# Patient Record
Sex: Male | Born: 1945 | Race: Black or African American | Hispanic: No | Marital: Married | Smoking: Former smoker
Health system: Southern US, Community
[De-identification: ages and names within clinical notes are randomized; demographics above are authoritative.]

## PROBLEM LIST (undated history)

## (undated) DIAGNOSIS — N400 Enlarged prostate without lower urinary tract symptoms: Secondary | ICD-10-CM

## (undated) DIAGNOSIS — I10 Essential (primary) hypertension: Secondary | ICD-10-CM

## (undated) DIAGNOSIS — E0590 Thyrotoxicosis, unspecified without thyrotoxic crisis or storm: Secondary | ICD-10-CM

## (undated) DIAGNOSIS — K8020 Calculus of gallbladder without cholecystitis without obstruction: Secondary | ICD-10-CM

## (undated) DIAGNOSIS — K5792 Diverticulitis of intestine, part unspecified, without perforation or abscess without bleeding: Secondary | ICD-10-CM

## (undated) DIAGNOSIS — K5720 Diverticulitis of large intestine with perforation and abscess without bleeding: Secondary | ICD-10-CM

## (undated) HISTORY — PX: MEDIAL PARTIAL KNEE REPLACEMENT: SHX5965

## (undated) HISTORY — DX: Diverticulitis of large intestine with perforation and abscess without bleeding: K57.20

---

## 2004-06-21 ENCOUNTER — Ambulatory Visit: Payer: Self-pay | Admitting: Ophthalmology

## 2006-04-22 ENCOUNTER — Ambulatory Visit: Payer: Self-pay | Admitting: Ophthalmology

## 2011-01-29 ENCOUNTER — Ambulatory Visit: Payer: Self-pay | Admitting: Internal Medicine

## 2011-07-13 ENCOUNTER — Ambulatory Visit: Payer: Self-pay | Admitting: Unknown Physician Specialty

## 2011-08-14 ENCOUNTER — Ambulatory Visit: Payer: Self-pay | Admitting: Unknown Physician Specialty

## 2011-08-20 ENCOUNTER — Ambulatory Visit: Payer: Self-pay | Admitting: Unknown Physician Specialty

## 2012-12-31 ENCOUNTER — Ambulatory Visit: Payer: Self-pay | Admitting: Ophthalmology

## 2012-12-31 LAB — CREATININE, SERUM
EGFR (African American): >60
EGFR (Non-African Amer.): >60

## 2013-10-22 LAB — COMPREHENSIVE METABOLIC PANEL
ALBUMIN: 3.2 g/dL — AB (ref 3.4–5.0)
ANION GAP: 6 — AB (ref 7–16)
Alkaline Phosphatase: 53 U/L
BUN: 17 mg/dL (ref 7–18)
Bilirubin,Total: 0.4 mg/dL (ref 0.2–1.0)
CALCIUM: 9.2 mg/dL (ref 8.5–10.1)
CO2: 23 mmol/L (ref 21–32)
CREATININE: 1.12 mg/dL (ref 0.60–1.30)
Chloride: 110 mmol/L — ABNORMAL HIGH (ref 98–107)
EGFR (Non-African Amer.): >60
Glucose: 150 mg/dL — ABNORMAL HIGH (ref 65–99)
Osmolality: 282 (ref 275–301)
POTASSIUM: 4 mmol/L (ref 3.5–5.1)
SGOT(AST): 32 U/L (ref 15–37)
SGPT (ALT): 36 U/L (ref 12–78)
Sodium: 139 mmol/L (ref 136–145)
Total Protein: 7.3 g/dL (ref 6.4–8.2)

## 2013-10-22 LAB — CBC
HCT: 46 % (ref 40.0–52.0)
HGB: 15 g/dL (ref 13.0–18.0)
MCH: 29.7 pg (ref 26.0–34.0)
MCHC: 32.6 g/dL (ref 32.0–36.0)
MCV: 91 fL (ref 80–100)
Platelet: 200 10*3/uL (ref 150–440)
RBC: 5.05 10*6/uL (ref 4.40–5.90)
RDW: 13.8 % (ref 11.5–14.5)
WBC: 15.1 10*3/uL — AB (ref 3.8–10.6)

## 2013-10-22 LAB — PROTIME-INR
INR: 1
Prothrombin Time: 13.5 secs (ref 11.5–14.7)

## 2013-10-23 ENCOUNTER — Inpatient Hospital Stay: Payer: Self-pay | Admitting: Internal Medicine

## 2013-10-23 LAB — URINALYSIS, COMPLETE
Bacteria: NONE SEEN
Bilirubin,UR: NEGATIVE
Blood: NEGATIVE
GLUCOSE, UR: NEGATIVE mg/dL (ref 0–75)
KETONE: NEGATIVE
Leukocyte Esterase: NEGATIVE
Nitrite: NEGATIVE
PROTEIN: NEGATIVE
Ph: 5 (ref 4.5–8.0)
Specific Gravity: 1.015 (ref 1.003–1.030)
Squamous Epithelial: 1

## 2013-10-23 LAB — HEMOGLOBIN
HGB: 13.4 g/dL (ref 13.0–18.0)
HGB: 13 g/dL (ref 13.0–18.0)
HGB: 12.2 g/dL — ABNORMAL LOW (ref 13.0–18.0)
HGB: 12.7 g/dL — AB (ref 13.0–18.0)
HGB: 12.3 g/dL — ABNORMAL LOW (ref 13.0–18.0)

## 2013-10-23 LAB — CLOSTRIDIUM DIFFICILE(ARMC)

## 2013-10-24 LAB — CBC WITH DIFFERENTIAL/PLATELET
Basophil #: 0.1 10*3/uL (ref 0.0–0.1)
Basophil %: 0.7 %
Eosinophil #: 0 10*3/uL (ref 0.0–0.7)
Eosinophil %: 0.3 %
HCT: 34.4 % — ABNORMAL LOW (ref 40.0–52.0)
HGB: 11.4 g/dL — ABNORMAL LOW (ref 13.0–18.0)
LYMPHS ABS: 1.6 10*3/uL (ref 1.0–3.6)
Lymphocyte %: 11.7 %
MCH: 30 pg (ref 26.0–34.0)
MCHC: 33.2 g/dL (ref 32.0–36.0)
MCV: 90 fL (ref 80–100)
MONOS PCT: 8.2 %
Monocyte #: 1.1 x10 3/mm — ABNORMAL HIGH (ref 0.2–1.0)
NEUTROS ABS: 11.1 10*3/uL — AB (ref 1.4–6.5)
NEUTROS PCT: 79.1 %
PLATELETS: 177 10*3/uL (ref 150–440)
RBC: 3.81 10*6/uL — AB (ref 4.40–5.90)
RDW: 13.7 % (ref 11.5–14.5)
WBC: 14 10*3/uL — AB (ref 3.8–10.6)

## 2013-10-24 LAB — BASIC METABOLIC PANEL
ANION GAP: 8 (ref 7–16)
BUN: 13 mg/dL (ref 7–18)
CALCIUM: 7.6 mg/dL — AB (ref 8.5–10.1)
Chloride: 111 mmol/L — ABNORMAL HIGH (ref 98–107)
Co2: 22 mmol/L (ref 21–32)
Creatinine: 1.18 mg/dL (ref 0.60–1.30)
EGFR (African American): >60
GLUCOSE: 94 mg/dL (ref 65–99)
OSMOLALITY: 281 (ref 275–301)
Potassium: 3.7 mmol/L (ref 3.5–5.1)
Sodium: 141 mmol/L (ref 136–145)

## 2013-10-25 LAB — CBC WITH DIFFERENTIAL/PLATELET
Basophil #: 0.1 10*3/uL (ref 0.0–0.1)
Basophil #: 0.1 10*3/uL (ref 0.0–0.1)
Basophil %: 0.7 %
Basophil %: 0.7 %
Eosinophil #: 0.2 10*3/uL (ref 0.0–0.7)
Eosinophil #: 0.1 10*3/uL (ref 0.0–0.7)
Eosinophil %: 1.3 %
Eosinophil %: 1.2 %
HCT: 29.9 % — ABNORMAL LOW (ref 40.0–52.0)
HCT: 28.6 % — ABNORMAL LOW (ref 40.0–52.0)
HGB: 9.8 g/dL — ABNORMAL LOW (ref 13.0–18.0)
HGB: 9.8 g/dL — ABNORMAL LOW (ref 13.0–18.0)
Lymphocyte #: 2.1 10*3/uL (ref 1.0–3.6)
Lymphocyte #: 1.3 10*3/uL (ref 1.0–3.6)
Lymphocyte %: 17 %
Lymphocyte %: 11 %
MCH: 29.8 pg (ref 26.0–34.0)
MCH: 30.8 pg (ref 26.0–34.0)
MCHC: 33 g/dL (ref 32.0–36.0)
MCHC: 34.3 g/dL (ref 32.0–36.0)
MCV: 90 fL (ref 80–100)
MCV: 90 fL (ref 80–100)
Monocyte #: 1 x10 3/mm (ref 0.2–1.0)
Monocyte #: 0.7 x10 3/mm (ref 0.2–1.0)
Monocyte %: 8.3 %
Monocyte %: 6.1 %
Neutrophil #: 9.2 10*3/uL — ABNORMAL HIGH (ref 1.4–6.5)
Neutrophil #: 9.8 10*3/uL — ABNORMAL HIGH (ref 1.4–6.5)
Neutrophil %: 72.7 %
Neutrophil %: 81 %
Platelet: 159 10*3/uL (ref 150–440)
Platelet: 155 10*3/uL (ref 150–440)
RBC: 3.31 10*6/uL — ABNORMAL LOW (ref 4.40–5.90)
RBC: 3.18 10*6/uL — ABNORMAL LOW (ref 4.40–5.90)
RDW: 13.6 % (ref 11.5–14.5)
RDW: 13.6 % (ref 11.5–14.5)
WBC: 12.6 10*3/uL — ABNORMAL HIGH (ref 3.8–10.6)
WBC: 12.1 10*3/uL — ABNORMAL HIGH (ref 3.8–10.6)

## 2013-10-25 LAB — BASIC METABOLIC PANEL
Anion Gap: 4 — ABNORMAL LOW (ref 7–16)
BUN: 13 mg/dL (ref 7–18)
Calcium, Total: 7.9 mg/dL — ABNORMAL LOW (ref 8.5–10.1)
Chloride: 108 mmol/L — ABNORMAL HIGH (ref 98–107)
Co2: 26 mmol/L (ref 21–32)
Creatinine: 1.08 mg/dL (ref 0.60–1.30)
EGFR (African American): >60
EGFR (Non-African Amer.): >60
Glucose: 87 mg/dL (ref 65–99)
Osmolality: 275 (ref 275–301)
Potassium: 4 mmol/L (ref 3.5–5.1)
Sodium: 138 mmol/L (ref 136–145)

## 2013-10-25 LAB — WBCS, STOOL

## 2013-10-25 LAB — STOOL CULTURE

## 2013-11-02 ENCOUNTER — Ambulatory Visit: Payer: Self-pay | Admitting: Unknown Physician Specialty

## 2013-11-04 LAB — PATHOLOGY REPORT

## 2014-09-10 HISTORY — PX: SHOULDER SURGERY: SHX246

## 2014-10-06 DIAGNOSIS — E0500 Thyrotoxicosis with diffuse goiter without thyrotoxic crisis or storm: Secondary | ICD-10-CM | POA: Insufficient documentation

## 2015-01-01 NOTE — Consult Note (Signed)
Pt eating real food, no further bleeding.  I would prefer him to go home and follow up with me as out patient for his colonoscopy.  This would give his gastritis and duodenitis time to improve before doing a bowel prep.  He reports no bowel movement since 9pm yesterday.  VSS afebrile, no pathogens in stool.  Could go home tomorrow and folllow up with me 2pm next Wednesday.  Electronic Signatures: Scot Jun (MD)  (Signed on 14-Feb-15 18:08)  Authored  Last Updated: 14-Feb-15 18:08 by Scot Jun (MD)

## 2015-01-01 NOTE — H&P (Signed)
PATIENT NAME:  Todd Hansen, Todd Hansen MR#:  [id number] DATE OF BIRTH:  1946/04/03  DATE OF ADMISSION:  10/23/2013  REFERRING PHYSICIAN:  Dr. Jacqualine Code.  PRIMARY CARE PHYSICIAN:  Dr. Tracie Harrier.   CHIEF COMPLAINT:  Bright red blood per rectum and lightheadedness.   HISTORY OF PRESENT ILLNESS:  This is a 69 year old male with significant past medical history of hypertension, rotator cuff injury, status post surgery and on naproxen on a daily basis because of that, the patient presents with complaints of bright red blood per rectum started this afternoon and lightheadedness, the patient reports he had multiple episodes of loose bowel movement this morning which is dark in color with some blood accompanying it, as well he reports he felt almost like passing out and sweaty this evening and when the EMS presented the patient was diaphoretic as well, the patient denies any vomiting, any nausea, any abdominal pain.  Denies any history of colonoscopy or endoscopy in the past.  Denies having history of gastric ulcers.  Denies any liver disease or esophageal varices.  As well, reports he never had any history of alcohol abuse, the patient's hemoglobin was 15, as well his white blood cells was 15.  Urinalysis still pending.  Denies any fever or chills, the patient had one more bowel movement in the ED which had minimal bright red blood per rectum and normal color stool.  The patient reports it appears to be much improving, but he still reports lightheadedness, I checked the patient's blood pressure.  It was in the mid-80s, the patient is currently being bolused IV normal saline 1 liter.  Hospitalist service requested to admit the patient.   PAST MEDICAL HISTORY:  Hypertension.   PAST SURGICAL HISTORY:  1.  Prostate surgery.  2.  Right shoulder arthroscopic surgery.   FAMILY HISTORY:  Significant for dementia in his mother and coronary artery disease in his father.   ALLERGIES:  CIPRO CAUSING HIVES.   HOME  MEDICATIONS: 1.  Naproxen 220 mg oral 2 tablets daily.  2.  Alka-Seltzer 2 tablets 2 times a day.  3.  Lisinopril 20 mg oral daily.   REVIEW OF SYSTEMS:  CONSTITUTIONAL:  Denies fever, chills.  Complains of weakness.  Denies weight gain, weight loss.  EYES:  Denies blurry vision, double vision, inflammation, glaucoma.  EARS, NOSE, THROAT:  Denies tinnitus, ear pain, hearing loss, epistaxis or discharge.  RESPIRATORY:  Denies cough, wheezing, hemoptysis, COPD or shortness of breath.  CARDIOVASCULAR:  Denies chest pain, edema, arrhythmia, palpitation.  Reports lightheadedness.  GASTROINTESTINAL:  Denies nausea, vomiting, diarrhea, abdominal pain, hematemesis, coffee-ground emesis.  Complains of bright red blood per rectum.  GENITOURINARY:  Denies dysuria, hematuria, renal colic.  ENDOCRINE:  Denies polyuria, polydipsia, heat or cold intolerance.  HEMATOLOGY:  Denies anemia, easy bruising, bleeding diathesis.  INTEGUMENTARY:  Denies acne, rash or skin lesion.  MUSCULOSKELETAL:  Denies any swelling, gout or arthritis or cramps.  NEUROLOGIC:  Denies CVA, TIA, headache, ataxia, or tremor.  PSYCHIATRIC:  Denies of anxiety, insomnia or depression.   PHYSICAL EXAMINATION: VITAL SIGNS:  Temperature 97.4, pulse 89, respiratory rate 22, blood pressure 84/50, saturating 96% on room air.  GENERAL:  Elderly male who looks comfortable in bed, in no apparent distress.  HEENT:  Head atraumatic, normocephalic.  Pupils equal, reactive to light.  Pink conjunctivae.  Anicteric sclerae.  Moist oral mucosa.  NECK:  Supple.  No thyromegaly.  No JVD.  CHEST:  Good air entry bilaterally.  No wheezing, rales or  rhonchi.  CARDIOVASCULAR:  S1, S2 heard.  No rubs, murmur or gallops.  ABDOMEN:  Soft, nontender, nondistended.  Bowel sounds present. EXTREMITIES:  No edema, no clubbing, no cyanosis.  Pedal pulses +2 bilaterally.  PSYCHIATRIC:  Appropriate affect.  Awake, alert x 3.  Intact judgment and insight.   NEUROLOGIC:  Cranial nerves grossly intact.  Motor 5 out of 5.  No focal deficits.  LYMPHATIC:  No cervical lymphadenopathy could be appreciated.  MUSCULOSKELETAL:  No joint effusion or erythema.   PERTINENT LABORATORY DATA:  Glucose 150, BUN 17, creatinine 1.12, sodium 139, potassium 4, chloride 110, ALT 36, AST 32, alk phos 53.  White blood cell 15.1, hemoglobin 15, hematocrit 46, platelet 200.  INR is 1.   ASSESSMENT AND PLAN: 1.  Gastrointestinal bleed, the patient presents with bright red blood per rectum, this is most likely due to lower gastrointestinal bleed source, the patient was therefore diagnosed with diverticulosis, but  never had any imaging or never had any colonoscopy done in the past, as well this might be upper gastrointestinal bleed, if he has significant gastrointestinal bleed, especially he is on nonsteroidal anti-inflammatory drugs, but this seems to be unlikely especially with normal BUN and he does not appear to have melena, it is more bright red blood per rectum, but we will hold his naproxen, we will start him on Protonix, he will be kept nothing by mouth.  We will monitor his hemoglobin every six hours and we will have low threshold to transfuse given his hypotension.  We will consult gastroenterology service.  We will hold 3 units packed red blood cells in case his next hemoglobin significantly drops.  2.  Presyncope/hypotension.  This is related to his volume depletion, most likely from his gastrointestinal bleed source.  We will give him 3 liters of IV normal saline fluid bolus.  We will monitor his hemoglobin and if needed we will transfuse him.  3.  Hypertension.  We will hold his medications.  4.  Deep vein thrombosis prophylaxis.  We will hold chemical anticoagulation due to his gastrointestinal bleed.  We will have him on sequential compression device.  5.  Leukocytosis, etiology is unclear, most likely stress, but we will check his urinalysis.  6.  CODE STATUS:   FULL CODE.   Total time spent on admission and patient care 50 minutes.    ____________________________ Albertine Patricia, MD dse:ea D: 10/23/2013 01:30:57 ET T: 10/23/2013 02:17:40 ET JOB#: [id number]  cc: Albertine Patricia, MD, <Dictator> Milley Vining Graciela Husbands MD ELECTRONICALLY SIGNED 10/24/2013 4:25

## 2015-01-01 NOTE — Consult Note (Signed)
CHIEF COMPLAINT and HISTORY:  Subjective/Chief Complaint GI bleed   History of Present Illness Patient is a 69 year old male with dark/bloody stools and loose bowel movements. Also associated with lightheadedness. Denies abdominal pain. Denies chest pain or shortness of breath. EGD was done today finding normal esophagus, gastritis, and duodenitis. Hx rotator cuff injury s/p surgery, takes naproxen.  We were consulted for consideration of embolization.   PAST MEDICAL/SURGICAL HISTORY:  Past Medical History:   BPH - Benign Prostatic Hypertrophy:    Kidney Stones:    Hypertension:    Cataract Extraction:    prostrate:   ALLERGIES:  Allergies:  Cipro: Rash  HOME MEDICATIONS:  Home Medications: Medication Instructions Status  lisinopril 20 mg oral tablet 1 tab(s) orally once a day (in the morning) Active  naproxen sodium sodium 220 mg oral tablet 2 tab(s) orally once a day Active  Alka-Seltzer 325 mg-1 g-1.9 g oral tablet, effervescent 2 tab(s) orally 2 times a day Active   Family and Social History:  Family History Coronary Artery Disease  dementia   Social History negative tobacco, negative ETOH, negative Illicit drugs   Review of Systems:  Fever/Chills No   Cough No   Abdominal Pain No   Diarrhea Yes   Nausea/Vomiting No   SOB/DOE No   Chest Pain No   Dysuria No   Physical Exam:  GEN no acute distress   HEENT PERRL, hearing intact to voice, moist oral mucosa   NECK supple  No masses   RESP normal resp effort  clear BS   CARD regular rate  no murmur  No LE edema   ABD denies tenderness  soft  normal BS   LYMPH negative neck   EXTR negative cyanosis/clubbing   SKIN normal to palpation, No rashes   NEURO cranial nerves intact   PSYCH alert, A+O to time, place, person   LABS:  Laboratory Results: Hepatic:    12-Feb-15 22:35, Comprehensive Metabolic Panel  Bilirubin, Total 0.4  Alkaline Phosphatase 53  45-117  NOTE: New Reference  Range  07/31/13  SGPT (ALT) 36  SGOT (AST) 32  Total Protein, Serum 7.3  Albumin, Serum 3.2  Routine BB:    12-Feb-15 22:35, Crossmatch 3 Units  Crossmatch Unit 1 -  Crossmatch Unit 2 -  Crossmatch Unit 3 -    12-Feb-15 22:35, Type and Antibody Screen  ABO Group + Rh Type   A Positive  Antibody Screen NEGATIVE  Result(s) reported on 23 Oct 2013 at 12:03AM.  Routine Micro:    13-Feb-15 11:31, Clostridium Difficile  Micro Text Report   CLOSTRIDIUM DIFFICILE    C.DIFFICILE ANTIGEN       C.DIFFICILE GDH ANTIGEN : NEGATIVE    C.DIFFICILE TOXIN A/B     C.DIFFICILE TOXINS A AND B : NEGATIVE    INTERPRETATION            Negative for C. difficile.      ANTIBIOTIC    13-Feb-15 11:31, Stool Comprehensive Culture  Micro Text Report   STOOL COMPREHENSIVE    COMMENT                   NO CAMPYLOBACTER ANTIGEN DETECTED     ANTIBIOTIC  Culture Comment    .   NO CAMPYLOBACTER ANTIGEN DETECTED   Result(s) reported on 23 Oct 2013 at 03:24PM.  Routine Chem:    12-Feb-15 22:35, Comprehensive Metabolic Panel  Glucose, Serum 150  BUN 17  Creatinine (comp) 1.12  Sodium,  Serum 139  Potassium, Serum 4.0  Chloride, Serum 110  CO2, Serum 23  Calcium (Total), Serum 9.2  Osmolality (calc) 282  eGFR (African American) >60  eGFR (Non-African American) >60  eGFR values <81m/min/1.73 m2 may be an indication of chronic  kidney disease (CKD).  Calculated eGFR is useful in patients with stable renal function.  The eGFR calculation will not be reliable in acutely ill patients  when serum creatinine is changing rapidly. It is not useful in   patients on dialysis. The eGFR calculation may not be applicable  to patients at the low and high extremes of body sizes, pregnant  women, and vegetarians.  Anion Gap 6    12-Feb-15 22:35, Crossmatch 3 Units  Result Comment   XMATCH - PRODUCTS NOT INDICATED PER DOLL   - FERGUSON 10-23-13 0800 SJL   Result(s) reported on 23 Oct 2013 at 12:16PM.  Routine UA:     13-Feb-15 09:21, Urinalysis  Color (UA) Yellow  Clarity (UA) Clear  Glucose (UA) Negative  Bilirubin (UA) Negative  Ketones (UA) Negative  Specific Gravity (UA) 1.015  Blood (UA) Negative  pH (UA) 5.0  Protein (UA) Negative  Nitrite (UA) Negative  Leukocyte Esterase (UA) Negative  Result(s) reported on 23 Oct 2013 at 09:49AM.  RBC (UA) 1 /HPF  WBC (UA) 4 /HPF  Bacteria (UA)   NONE SEEN  Epithelial Cells (UA) 1 /HPF  Mucous (UA) PRESENT  Result(s) reported on 23 Oct 2013 at 09:49AM.  Routine Coag:    12-Feb-15 22:35, Prothrombin Time  Prothrombin 13.5  INR 1.0  INR reference interval applies to patients on anticoagulant therapy.  A single INR therapeutic range for coumarins is not optimal for all  indications; however, the suggested range for most indications is  2.0 - 3.0.  Exceptions to the INR Reference Range may include: Prosthetic heart  valves, acute myocardial infarction, prevention of myocardial  infarction, and combinations of aspirin and anticoagulant. The need  for a higher or lower target INR must be assessed individually.  Reference: The Pharmacology and Management of the Vitamin K   antagonists: the seventh ACCP Conference on Antithrombotic and  Thrombolytic Therapy. CRKYHC.6237Sept:126 (3suppl): [id number]  A HCT value >55% may artifactually increase the PT.  In one study,   the increase was an average of 25%.  Reference:  "Effect on Routine and Special Coagulation Testing Values  of Citrate Anticoagulant Adjustment in Patients with High HCT Values."  American Journal of Clinical Pathology 2006;126:400-405.  Routine Hem:    12-Feb-15 22:35, Hemogram, Platelet Count  WBC (CBC) 15.1  RBC (CBC) 5.05  Hemoglobin (CBC) 15.0  Hematocrit (CBC) 46.0  Platelet Count (CBC) 200  Result(s) reported on 22 Oct 2013 at 11:26PM.  MCV 91  MCH 29.7  MCHC 32.6  RDW 13.8    13-Feb-15 05:04, Hemoglobin  Hemoglobin (CBC) 13.4  Result(s) reported on 23 Oct 2013 at  05:41AM.    13-Feb-15 10:21, Hemoglobin  Hemoglobin (CBC) 13.0  Result(s) reported on 23 Oct 2013 at 10:46AM.    13-Feb-15 14:24, Hemoglobin  Hemoglobin (CBC) 12.2  Result(s) reported on 23 Oct 2013 at 02:51PM.    13-Feb-15 16:28, Hemoglobin  Hemoglobin (CBC) 12.7  Result(s) reported on 23 Oct 2013 at 04:42PM.   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     Findings:  The diffusion weighted imaging demonstrates no evidence of   abnormal regions of restricted diffusion.  There is no evidence of abnormal parenchymal enhancement or enhancing   masses or nodules. No intra-axial or extra-axial fluid collections are   identified. Mild areas of increased T2 signal project within the   subcortical, deep, and periventricular white matter regions. There is no   evidence of subfalcine or tonsillar herniation. The sella and parasellar   regions and structures as well as the cerebellopontine angle regions and   visualized portions of the seventh and eighth cranial nerves demonstrate   no signal or enhancement abnormalities. The cerebellum, pons and midbrain   demonstrate no signal or enhancement abnormalities. There is mild diffuse   cortical atrophy.  Limited evaluation of the optic nerves demonstrates no   signal or enhancement abnormalities. The optic chiasm is grossly   unremarkable. The intraconal and extraconal fat as well as  the   extraocular musculature and the pre and post septal regions demonstrate   no signal or enhancement abnormalities.  IMPRESSION: Involutional changes without evidence of focalor acute   abnormalities.       Thank you for this opportunity to contribute to the care of your patient.         Verified By: Mikki Santee, M.D., MD  Nuclear Med:    13-Feb-15 16:05, GI Blood Loss Study - Nuc Med  GI Blood Loss Study - Nuc Med  REASON FOR EXAM:    acute lower GI bleeding  COMMENTS:       PROCEDURE: NM  - NM GI BLOOD LOSS STUDY  - Oct 23 2013  4:05PM     CLINICAL DATA:  Acute lower GI hemorrhage    EXAM:  NUCLEAR MEDICINE GASTROINTESTINAL BLEEDING SCAN    TECHNIQUE:  Sequential abdominal images were obtained following intravenous  administration of Tc-46mlabeled red blood cells.    COMPARISON:  None.  RADIOPHARMACEUTICALS:  265m Tc-9945m-vitro labeled red cells.    FINDINGS:  Expected bio-distribution is appreciated in the region of the liver,  kidneys, vasculature, urinary bladder.    There is no evidence of abnormal mobile foci of increased  radiotracer activity along the course of the gastrointestinal tract.     IMPRESSION:  No evidence of scintigraphic appreciable active GI hemorrhage.      Electronically Signed    By: HecMargaree MackintoshD.    On: 10/23/2013 16:12         Verified By: HECMikki Santee.D., MD   ASSESSMENT AND PLAN:  Assessment/Admission Diagnosis GI bleed s/p EGD- normal esophagus, gastritis, esophagitis- on protonix H/H monitoring  Bleeding scan negative. No role for coil embolization unless active bleeding source found on bleeding scan. Will follow in case situation changes. GI planning colonoscopy.   Electronic Signatures: HanSu GrandA-C)  (Signed 13-Feb-15 20:30)  Authored: Chief Complaint and History, PAST MEDICAL/SURGICAL HISTORY, ALLERGIES, HOME MEDICATIONS, Family and Social  History, Review of Systems, Physical Exam,  LABS, RADIOLOGY, Assessment and Plan   Last Updated: 13-Feb-15 20:30 by Su Grand (PA-C)

## 2015-01-01 NOTE — Consult Note (Signed)
Pt seen and consult dictated.  Will do EGD today to confirm if this is upper or lower source.  Serial HGB recommended.  If EGD neg consider GI bleeding scan.  Electronic Signatures: Scot Jun (MD)  (Signed on 13-Feb-15 12:38)  Authored  Last Updated: 13-Feb-15 12:38 by Scot Jun (MD)

## 2015-01-01 NOTE — Discharge Summary (Signed)
PATIENT NAME:  Todd Hansen, Todd Hansen MR#:  [id number] DATE OF BIRTH:  08-Jul-1946  DATE OF ADMISSION:  10/23/2013 DATE OF DISCHARGE:  10/25/2013  DISCHARGE DIAGNOSES: 1.  Anemia secondary to lower gastrointestinal bleed.  2.  Lower gastrointestinal bleed. 3.  Hypertension.  4.  Cough, likely viral bronchitis.   DISCHARGE MEDICATIONS:   1.  Tylenol p.r.n. pain.  2.  Robitussin-AC 5 mL q.6 hours p.r.n. cough.  3.  Hold the lisinopril 2 to 3 days to ensure he is actually needing his lisinopril at that point.   His blood pressure can be checked by Dr. Mechele Collin on Wednesday with today being Sunday.   HISTORY AND PHYSICAL: Please see detailed history and physical done on admission, which was reviewed.   HOSPITAL COURSE: The patient was admitted with loose, copious stools, bright red, felt to have a GI bleed. EGD was done without significant findings. He naproxen will be stopped even though, again, this is likely a lower GI bleed. Hemoglobin was initially 15.0 on admission, slowly came down to 9.8 today, yesterday was 11.4. However, he has had no stools in 36 to 48 hours and the drop at this point is felt to be hemodilution per Dr. Mechele Collin from GI. Dr. Mechele Collin wishes for him to follow up soon with his office, tentatively. Wednesday's date was set as noted. He did have a nuclear medicine GI blood loss study that showed no evidence of appreciable GI  bleed at the time it was done on February 13th at 1605. He developed a hacky, dry cough helped by Tussionex. Robitussin-AC will be written for discharge for cost reasons. We will recheck a hemoglobin again today to make sure he does not have any further drop for a non-GI  bleed time scenario prior to discharge.   TIME SPENT: It took approximately 33 minutes to do discharge tasks.   ____________________________ Marya Amsler. Dareen Piano, MD mwa:cs D: 10/25/2013 10:06:00 ET T: 10/25/2013 14:06:36 ET JOB#: [id number]  cc: Marya Amsler. Dareen Piano, MD, <Dictator> Lauro Regulus MD ELECTRONICALLY SIGNED 10/26/2013 8:18

## 2015-01-01 NOTE — Consult Note (Signed)
Pt denies bleeding, no bowel movement during nite.  Chest clear, hgb this am is 9.8, was 11.4, plt 159.  this drop in hgb  may be hemodilution from body fluid shift given no bleeding overnight. Will repeat CBC now and see if changes. If stable lab test he could go home and see me in office later this week and set up colonoscopy as this was likely diverticular bleed.   Electronic Signatures: Scot Jun (MD)  (Signed on 15-Feb-15 09:21)  Authored  Last Updated: 15-Feb-15 09:21 by Scot Jun (MD)

## 2015-01-01 NOTE — Consult Note (Signed)
Pt EGD showed gastritis and duodenitis.  No blood seen.  Pt with fresh loose watery bleeding today before the EGD.  Will send for stat LGI bleeding scan.  May need arteriography or colonoscopy attempt.  Electronic Signatures: Scot Jun (MD)  (Signed on 13-Feb-15 14:07)  Authored  Last Updated: 13-Feb-15 14:07 by Scot Jun (MD)

## 2015-01-01 NOTE — Consult Note (Signed)
PATIENT NAME:  Todd Hansen, Todd Hansen MR#:  [id number] DATE OF BIRTH:  Apr 21, 1946  DATE OF CONSULTATION:  10/23/2013  REFERRING PHYSICIAN:   CONSULTING PHYSICIAN:  Scot Jun, MD  HISTORY OF PRESENT ILLNESS: The patient is a 69 year old black male, who had the onset yesterday of darkish red blood per rectum with diarrhea. This was noted to be several bowel movements. He developed symptoms of volume depletion with diaphoresis and almost passing out. He was brought to the Emergency Room with blood pressure systolic that was in the mid 80s. He was given IV fluids and admitted to the hospital. I was asked to see him for GI bleeding.   The patient denies any prior similar illness. He denies any abdominal pain. He never had a colonoscopy and never had an upper endoscopy.   HOME MEDICATIONS: Aleve 220 mg 2 tablets daily on an empty stomach, Alka-Seltzer 2 tablets 2 times a day and lisinopril 20 mg a day.   PAST SURGICAL HISTORY: Surgery for enlarged prostate and right shoulder arthroscopic surgery.   ALLERGIES: CIPRO CAUSES HIVES.  FAMILY HISTORY:  Coronary artery disease in his father.   SOCIAL HISTORY: The patient is retired. He plays golf 3 days a week and bowls 3 days a week. He denies any prior similar occurrence of GI bleeding. His bleeding has slowed down since he has been in the hospital. He has had 2 episodes of rustic reddish blood today.   His initial hemoglobin was 15 and today at 10:20 this morning it was 13.   REVIEW OF SYSTEMS: No chest pains. No asthma, wheezing, emphysema, chronic cough, shortness of breath. No prior GI bleeding. No dysuria or hematuria. He has had kidney stones in the past. Denies any chest pains or irregular skipping heartbeats.   FAMILY HISTORY: Negative for colon cancer. Negative for colon polyps.   PHYSICAL EXAMINATION:  GENERAL: Black male in no acute distress.  HEENT: Sclerae anicteric. Head is atraumatic.  CHEST: Clear.  HEART: No murmurs, gallops,  clicks, or rubs.  ABDOMEN: Soft, nontender. No hepatosplenomegaly. No masses.  SKIN: Warm and dry.  PSYCHIATRIC: Mood and affect are appropriate.   ASSESSMENT: Gastrointestinal bleeding in the face of a patient who has been on nonsteroidals and aspirin suggesting lower gastrointestinal bleed, possibly diverticulosis, possibly nonsteroidal effect. Cannot rule out neoplasm. Cannot  rule out arteriovenous malformation. I am a little concerned about the possibility of upper gastrointestinal bleed given his use of nonsteroidal anti-inflammatory medications and the initial stool was darkish red. It is encouraging that his bleeding has slowed down. I am going to do an upper endoscopy within the next few hours to make sure that this is not due to an ulcer in the upper gastrointestinal tract and if that is negative, follow along and consider when to do a colonoscopy. We will follow with you.  ____________________________ Scot Jun, MD rte:aw D: 10/23/2013 12:36:30 ET T: 10/23/2013 13:06:55 ET JOB#: [id number]  cc: Scot Jun, MD, <Dictator> Barbette Reichmann, MD Scot Jun MD ELECTRONICALLY SIGNED 11/19/2013 11:11

## 2015-09-16 DIAGNOSIS — I10 Essential (primary) hypertension: Secondary | ICD-10-CM | POA: Diagnosis present

## 2015-12-22 ENCOUNTER — Emergency Department: Payer: Medicare Other

## 2015-12-22 ENCOUNTER — Encounter: Payer: Self-pay | Admitting: Emergency Medicine

## 2015-12-22 ENCOUNTER — Observation Stay
Admission: EM | Admit: 2015-12-22 | Discharge: 2015-12-23 | Disposition: A | Discharge status: Home / Self Care | Payer: Medicare Other | Attending: Internal Medicine | Admitting: Internal Medicine

## 2015-12-22 ENCOUNTER — Observation Stay (HOSPITAL_BASED_OUTPATIENT_CLINIC_OR_DEPARTMENT_OTHER)
Admit: 2015-12-22 | Discharge: 2015-12-22 | Disposition: A | Discharge status: Home / Self Care | Payer: Medicare Other | Attending: Specialist | Admitting: Specialist

## 2015-12-22 ENCOUNTER — Observation Stay: Payer: Medicare Other

## 2015-12-22 DIAGNOSIS — I639 Cerebral infarction, unspecified: Secondary | ICD-10-CM

## 2015-12-22 DIAGNOSIS — N400 Enlarged prostate without lower urinary tract symptoms: Secondary | ICD-10-CM | POA: Insufficient documentation

## 2015-12-22 DIAGNOSIS — Z881 Allergy status to other antibiotic agents status: Secondary | ICD-10-CM | POA: Insufficient documentation

## 2015-12-22 DIAGNOSIS — G459 Transient cerebral ischemic attack, unspecified: Secondary | ICD-10-CM | POA: Diagnosis not present

## 2015-12-22 DIAGNOSIS — I10 Essential (primary) hypertension: Secondary | ICD-10-CM | POA: Insufficient documentation

## 2015-12-22 DIAGNOSIS — K5792 Diverticulitis of intestine, part unspecified, without perforation or abscess without bleeding: Secondary | ICD-10-CM | POA: Diagnosis not present

## 2015-12-22 DIAGNOSIS — I6350 Cerebral infarction due to unspecified occlusion or stenosis of unspecified cerebral artery: Secondary | ICD-10-CM

## 2015-12-22 DIAGNOSIS — I6523 Occlusion and stenosis of bilateral carotid arteries: Secondary | ICD-10-CM | POA: Insufficient documentation

## 2015-12-22 DIAGNOSIS — Z79899 Other long term (current) drug therapy: Secondary | ICD-10-CM | POA: Diagnosis not present

## 2015-12-22 DIAGNOSIS — K8020 Calculus of gallbladder without cholecystitis without obstruction: Secondary | ICD-10-CM | POA: Insufficient documentation

## 2015-12-22 DIAGNOSIS — R200 Anesthesia of skin: Secondary | ICD-10-CM

## 2015-12-22 DIAGNOSIS — I081 Rheumatic disorders of both mitral and tricuspid valves: Secondary | ICD-10-CM | POA: Diagnosis not present

## 2015-12-22 DIAGNOSIS — E0590 Thyrotoxicosis, unspecified without thyrotoxic crisis or storm: Secondary | ICD-10-CM | POA: Insufficient documentation

## 2015-12-22 DIAGNOSIS — Z8249 Family history of ischemic heart disease and other diseases of the circulatory system: Secondary | ICD-10-CM | POA: Insufficient documentation

## 2015-12-22 HISTORY — DX: Benign prostatic hyperplasia without lower urinary tract symptoms: N40.0

## 2015-12-22 HISTORY — DX: Thyrotoxicosis, unspecified without thyrotoxic crisis or storm: E05.90

## 2015-12-22 HISTORY — DX: Diverticulitis of intestine, part unspecified, without perforation or abscess without bleeding: K57.92

## 2015-12-22 HISTORY — DX: Essential (primary) hypertension: I10

## 2015-12-22 HISTORY — DX: Calculus of gallbladder without cholecystitis without obstruction: K80.20

## 2015-12-22 LAB — COMPREHENSIVE METABOLIC PANEL
ALBUMIN: 4.1 g/dL (ref 3.5–5.0)
ALK PHOS: 49 U/L (ref 38–126)
ALT: 32 U/L (ref 17–63)
ANION GAP: 5 (ref 5–15)
AST: 26 U/L (ref 15–41)
BUN: 25 mg/dL — ABNORMAL HIGH (ref 6–20)
CHLORIDE: 110 mmol/L (ref 101–111)
CO2: 22 mmol/L (ref 22–32)
Calcium: 9.2 mg/dL (ref 8.9–10.3)
Creatinine, Ser: 0.99 mg/dL (ref 0.61–1.24)
GFR calc Af Amer: >60 mL/min (ref >60)
GFR calc non Af Amer: >60 mL/min (ref >60)
GLUCOSE: 95 mg/dL (ref 65–99)
POTASSIUM: 4.1 mmol/L (ref 3.5–5.1)
SODIUM: 137 mmol/L (ref 135–145)
Total Bilirubin: 0.7 mg/dL (ref 0.3–1.2)
Total Protein: 7.9 g/dL (ref 6.5–8.1)

## 2015-12-22 LAB — DIFFERENTIAL
BASOS PCT: 0 %
Basophils Absolute: 0 10*3/uL (ref 0–0.1)
EOS ABS: 0.4 10*3/uL (ref 0–0.7)
EOS PCT: 5 %
LYMPHS PCT: 18 %
Lymphs Abs: 1.7 10*3/uL (ref 1.0–3.6)
Monocytes Absolute: 1 10*3/uL (ref 0.2–1.0)
Monocytes Relative: 10 %
NEUTROS PCT: 67 %
Neutro Abs: 6.3 10*3/uL (ref 1.4–6.5)

## 2015-12-22 LAB — CBC
HCT: 46.9 % (ref 40.0–52.0)
Hemoglobin: 16 g/dL (ref 13.0–18.0)
MCH: 30.8 pg (ref 26.0–34.0)
MCHC: 34.2 g/dL (ref 32.0–36.0)
MCV: 90 fL (ref 80.0–100.0)
PLATELETS: 218 10*3/uL (ref 150–440)
RBC: 5.21 MIL/uL (ref 4.40–5.90)
RDW: 14.4 % (ref 11.5–14.5)
WBC: 9.4 10*3/uL (ref 3.8–10.6)

## 2015-12-22 LAB — APTT: aPTT: 26 seconds (ref 24–36)

## 2015-12-22 LAB — TROPONIN I: Troponin I: <0.03 ng/mL (ref <0.031)

## 2015-12-22 LAB — PROTIME-INR
INR: 0.98
PROTHROMBIN TIME: 13.2 s (ref 11.4–15.0)

## 2015-12-22 LAB — GLUCOSE, CAPILLARY: Glucose-Capillary: 92 mg/dL (ref 65–99)

## 2015-12-22 MED ORDER — ONDANSETRON HCL 4 MG/2ML IJ SOLN: 4.0000 mg | Freq: Four times a day (QID) | INTRAMUSCULAR | Status: DC | PRN

## 2015-12-22 MED ORDER — ASPIRIN 81 MG PO CHEW
324.0000 mg | CHEWABLE_TABLET | Freq: Once | ORAL | Status: AC
  Administered 2015-12-22: 324 mg via ORAL
  Filled 2015-12-22: qty 4

## 2015-12-22 MED ORDER — PROPYLTHIOURACIL 50 MG PO TABS
25.0000 mg | ORAL_TABLET | Freq: Two times a day (BID) | ORAL | Status: DC
  Administered 2015-12-22 – 2015-12-23 (×2): 25 mg via ORAL
  Filled 2015-12-22 (×3): qty 0.5

## 2015-12-22 MED ORDER — ACETAMINOPHEN 325 MG PO TABS
650.0000 mg | ORAL_TABLET | Freq: Four times a day (QID) | ORAL | Status: DC | PRN
  Administered 2015-12-23 (×2): 650 mg via ORAL
  Filled 2015-12-22 (×2): qty 2

## 2015-12-22 MED ORDER — ASPIRIN EC 81 MG PO TBEC: 81.0000 mg | DELAYED_RELEASE_TABLET | Freq: Every day | ORAL | Status: DC

## 2015-12-22 MED ORDER — ASPIRIN EC 81 MG PO TBEC
81.0000 mg | DELAYED_RELEASE_TABLET | Freq: Every day | ORAL | Status: DC
  Administered 2015-12-23: 81 mg via ORAL
  Filled 2015-12-22: qty 1

## 2015-12-22 MED ORDER — LOSARTAN POTASSIUM 50 MG PO TABS
50.0000 mg | ORAL_TABLET | Freq: Every day | ORAL | Status: DC
  Administered 2015-12-23: 08:00:00 50 mg via ORAL
  Filled 2015-12-22: qty 1

## 2015-12-22 MED ORDER — ACETAMINOPHEN 650 MG RE SUPP: 650.0000 mg | Freq: Four times a day (QID) | RECTAL | Status: DC | PRN

## 2015-12-22 MED ORDER — ONDANSETRON HCL 4 MG PO TABS: 4.0000 mg | ORAL_TABLET | Freq: Four times a day (QID) | ORAL | Status: DC | PRN

## 2015-12-22 NOTE — ED Notes (Signed)
Admitting MD at bedside.

## 2015-12-22 NOTE — ED Notes (Signed)
Called code Stroke into [facility]  Talked to [facility] [id number]

## 2015-12-22 NOTE — Progress Notes (Signed)
*  PRELIMINARY RESULTS* Echocardiogram 2D Echocardiogram has been performed.  Todd Hansen 12/22/2015, 7:19 PM

## 2015-12-22 NOTE — H&P (Signed)
[facility] [place] at [facility]   PATIENT NAME: Todd Hansen    MR#:  [id number]  DATE OF BIRTH:  1946-03-31  DATE OF ADMISSION:  12/22/2015  PRIMARY CARE PHYSICIAN: Barbette Reichmann, MD   REQUESTING/REFERRING PHYSICIAN: Dr. Chari Manning  CHIEF COMPLAINT:   Chief Complaint  Patient presents with  . Code Stroke  Left upper extremity numbness.  HISTORY OF PRESENT ILLNESS:  Todd Hansen  is a 70 y.o. male with a known history of Hypertension, diverticulitis, hyperthyroidism, who presents to the hospital due to left upper extremity numbness and tingling and also some slurred speech today. Patient says that for the past 2-3 days she's been having some left hand and upper extremity numbness and tingling associated with some shoulder pain on the left side. Today his wife noticed that his speech was also slurred and his right side of his face looking bit twisted. Patient's wife advised to come to the ER for further evaluation. Patient denied any headache, nausea, vomiting, shortness of breath, chest pain, dizziness or any other associated symptoms along with the left upper extremity numbness tingling and slurred speech. Hospitalist services were contacted further treatment and evaluation.  PAST MEDICAL HISTORY:   Past Medical History  Diagnosis Date  . Hypertension   . Gallstones   . Enlarged prostate   . Diverticulitis   . Hyperthyroidism     PAST SURGICAL HISTORY:  No past surgical history on file.  SOCIAL HISTORY:   Social History  Substance Use Topics  . Smoking status: Never Smoker   . Smokeless tobacco: Not on file  . Alcohol Use: Yes     Comment: Social    FAMILY HISTORY:   Family History  Problem Relation Age of Onset  . Heart disease Mother   . Heart disease Father     DRUG ALLERGIES:   Allergies  Allergen Reactions  . Ciprofloxacin Rash    REVIEW OF SYSTEMS:   Review of Systems  Constitutional: Negative for fever and  weight loss.  HENT: Negative for congestion, nosebleeds and tinnitus.   Eyes: Negative for blurred vision, double vision and redness.  Respiratory: Negative for cough, hemoptysis and shortness of breath.   Cardiovascular: Negative for chest pain, orthopnea, leg swelling and PND.  Gastrointestinal: Negative for nausea, vomiting, abdominal pain, diarrhea and melena.  Genitourinary: Negative for dysuria, urgency and hematuria.  Musculoskeletal: Negative for joint pain and falls.  Neurological: Positive for sensory change (eft upper extremity numbness and tingling) and speech change. Negative for dizziness, tingling, focal weakness, seizures, weakness and headaches.  Endo/Heme/Allergies: Negative for polydipsia. Does not bruise/bleed easily.  Psychiatric/Behavioral: Negative for depression and memory loss. The patient is not nervous/anxious.     MEDICATIONS AT HOME:   Prior to Admission medications   Medication Sig Start Date End Date Taking? Authorizing Provider  losartan (COZAAR) 50 MG tablet Take 50 mg by mouth daily.   Yes Historical Provider, MD  naproxen sodium (ANAPROX) 220 MG tablet Take 220 mg by mouth 2 (two) times daily as needed.   Yes Historical Provider, MD  propylthiouracil (PTU) 50 MG tablet Take 25 mg by mouth 2 (two) times daily.   Yes Historical Provider, MD      VITAL SIGNS:  Blood pressure 135/79, pulse 60, temperature 97.6 F (36.4 C), temperature source Oral, resp. rate 21, height  (1.702 m), weight 79.379 kg (175 lb), SpO2 95 %.  PHYSICAL EXAMINATION:  Physical Exam  GENERAL:  70 y.o.-year-old patient lying in  the bed with no acute distress.  EYES: Pupils equal, round, reactive to light and accommodation. No scleral icterus. Extraocular muscles intact.  HEENT: Head atraumatic, normocephalic. Oropharynx and nasopharynx clear. No oropharyngeal erythema, moist oral mucosa  NECK:  Supple, no jugular venous distention. No thyroid enlargement, no tenderness.   LUNGS: Normal breath sounds bilaterally, no wheezing, rales, rhonchi. No use of accessory muscles of respiration.  CARDIOVASCULAR: S1, S2 RRR. No murmurs, rubs, gallops, clicks.  ABDOMEN: Soft, nontender, nondistended. Bowel sounds present. No organomegaly or mass.  EXTREMITIES: No pedal edema, cyanosis, or clubbing. + 2 pedal & radial pulses b/l.   NEUROLOGIC: Cranial nerves II through XII are intact. No focal Motor appreciated b/l.  Mild left upper extremity numbness. PSYCHIATRIC: The patient is alert and oriented x 3. Good affect.  SKIN: No obvious rash, lesion, or ulcer.   LABORATORY PANEL:   CBC  Recent Labs Lab 12/22/15 1337  WBC 9.4  HGB 16.0  HCT 46.9  PLT 218   ------------------------------------------------------------------------------------------------------------------  Chemistries   Recent Labs Lab 12/22/15 1337  NA 137  K 4.1  CL 110  CO2 22  GLUCOSE 95  BUN 25*  CREATININE 0.99  CALCIUM 9.2  AST 26  ALT 32  ALKPHOS 49  BILITOT 0.7   ------------------------------------------------------------------------------------------------------------------  Cardiac Enzymes  Recent Labs Lab 12/22/15 1337  TROPONINI <0.03   ------------------------------------------------------------------------------------------------------------------  RADIOLOGY:  Ct Head Wo Contrast  12/22/2015  CLINICAL DATA:  Left arm tingling for the past 3 days with possible slurring words today. Code stroke. EXAM: CT HEAD WITHOUT CONTRAST TECHNIQUE: Contiguous axial images were obtained from the base of the skull through the vertex without intravenous contrast. COMPARISON:  Brain MRI 12/31/2012 FINDINGS: Skull and Sinuses:Negative for fracture or destructive process. Chronic advanced atlantodental degenerative disease. Visualized orbits: Bilateral cataract resection Brain: Unremarkable. No evidence of acute infarction, hemorrhage, hydrocephalus, or mass lesion/mass effect. These  results were called by telephone at the time of interpretation on 12/22/2015 at 1:41 pm to Dr. Sharman Cheek , who verbally acknowledged these results. IMPRESSION: Negative study.  No hemorrhage or evidence of infarct. Electronically Signed   By: Marnee Spring M.D.   On: 12/22/2015 13:41     IMPRESSION AND PLAN:   70 year old male with past medical history of hypertension, hyperthyroidism, diverticulitis presents to the hospital due to left upper extremity numbness and tingling and slurred speech.  1. TIA-this is the working diagnosis given the patient's transient neurologic symptoms which have improved presently. -Patient's CT head on admission is negative. I will get MRI of his brain, carotid duplex, 2-D echocardiogram. -Start him on a baby aspirin. Check lipid profile.  2. Essential hypertension-continue losartan.  3. Hyperthyroidism-continue propylthiouracil    All the records are reviewed and case discussed with ED provider. Management plans discussed with the patient, family and they are in agreement.  CODE STATUS: Full  TOTAL TIME TAKING CARE OF THIS PATIENT: 45 minutes.    Houston Siren M.D on 12/22/2015 at 3:31 PM  Between 7am to 6pm - Pager - [phone]  After 6pm go to [email or web address] - password [email or web address] [facility]  [facility]  Office  [phone]  CC: Primary care physician; Barbette Reichmann, MD

## 2015-12-22 NOTE — ED Provider Notes (Signed)
[facility] Emergency Department Provider Note  ____________________________________________  Time seen: Approximately 3:01 PM  I have reviewed the triage vital signs and the nursing notes.   HISTORY  Chief Complaint Code Stroke    HPI Todd E Lucchese Sr. is a 70 y.o. male with history of hypertension presents for evaluation of 3 days left arm tingling as well as approximately one hour of slurred speech, gradual onset, initially moderate, now mild/improving, no modifying factors. Patient reports that he has had some numbness and tickling in the left arm for the past 3 days which has been constant. Today at approximately 12:30 PM his wife noted that he was slurring his speech. He denies any numbness in any other part of his body. No weakness. No chest pain or difficulty breathing. No recent illness include no vomiting, diarrhea, fevers or chills. He is not chronically anticoagulated.   Past Medical History  Diagnosis Date  . Hypertension   . Gallstones   . Enlarged prostate   . Diverticulitis     There are no active problems to display for this patient.   No past surgical history on file.  Current Outpatient Rx  Name  Route  Sig  Dispense  Refill  . losartan (COZAAR) 50 MG tablet   Oral   Take 50 mg by mouth daily.      0   . naproxen sodium (ANAPROX) 220 MG tablet   Oral   Take 220 mg by mouth 2 (two) times daily as needed.         . propylthiouracil (PTU) 50 MG tablet   Oral   Take 25 mg by mouth 2 (two) times daily.      0     Allergies Ciprofloxacin  No family history on file.  Social History Social History  Substance Use Topics  . Smoking status: Never Smoker   . Smokeless tobacco: Not on file  . Alcohol Use: Yes     Comment: Social    Review of Systems Constitutional: No fever/chills Eyes: No visual changes. ENT: No sore throat. Cardiovascular: Denies chest pain. Respiratory: Denies shortness of  breath. Gastrointestinal: No abdominal pain.  No nausea, no vomiting.  No diarrhea.  No constipation. Genitourinary: Negative for dysuria. Musculoskeletal: Negative for back pain. Skin: Negative for rash. Neurological: Negative for headaches, focal weakness. + numbness.  10-point ROS otherwise negative.  ____________________________________________   PHYSICAL EXAM:  VITAL SIGNS: ED Triage Vitals  Enc Vitals Group     BP 12/22/15 1325 160/92 mmHg     Pulse Rate 12/22/15 1325 65     Resp 12/22/15 1325 16     Temp 12/22/15 1325 97.6 F (36.4 C)     Temp Source 12/22/15 1325 Oral     SpO2 12/22/15 1325 95 %     Weight 12/22/15 1325 175 lb (79.379 kg)     Height 12/22/15 1325  (1.702 m)     Head Cir --      Peak Flow --      Pain Score --      Pain Loc --      Pain Edu? --      Excl. in GC? --     Constitutional: Alert and oriented. Well appearing and in no acute distress. Eyes: Conjunctivae are normal. PERRL. EOMI. Head: Atraumatic. Nose: No congestion/rhinnorhea. Mouth/Throat: Mucous membranes are moist.  Oropharynx non-erythematous. Neck: No stridor. Supple without meningismus. Cardiovascular: Normal rate, regular rhythm. Grossly normal heart sounds.  Good  peripheral circulation. Respiratory: Normal respiratory effort.  No retractions. Lungs CTAB. Gastrointestinal: Soft and nontender. No distention. No CVA tenderness. Genitourinary: deferred Musculoskeletal: No lower extremity tenderness nor edema.  No joint effusions. Neurologic:  Normal speech and language without aphasia or dysarthria. 5 out of 5 strength bilateral upper and lower extremity sensation intact to light touch in all extremities. Mild right facial droop but the remainder of his cranial nerves, II-XII are intact. No pronator drift. Skin:  Skin is warm, dry and intact. No rash noted. Psychiatric: Mood and affect are normal. Speech and behavior are normal.  ____________________________________________    LABS (all labs ordered are listed, but only abnormal results are displayed)  Labs Reviewed  COMPREHENSIVE METABOLIC PANEL - Abnormal; Notable for the following:    BUN 25 (*)    All other components within normal limits  PROTIME-INR  APTT  CBC  DIFFERENTIAL  TROPONIN I  GLUCOSE, CAPILLARY  CBG MONITORING, ED   ____________________________________________  EKG  ED ECG REPORT I, Gayla Doss, the attending physician, personally viewed and interpreted this ECG.   Date: 12/22/2015  EKG Time: 13:39  Rate: 64  Rhythm: normal sinus rhythm  Axis: normal  Intervals:none  ST&T Change: No acute ST elevation. RSR prime in V1 probably normal variant.  ____________________________________________  RADIOLOGY  CT head  IMPRESSION: Negative study. No hemorrhage or evidence of infarct.  ____________________________________________   PROCEDURES  Procedure(s) performed: None  Critical Care performed: No  ____________________________________________   INITIAL IMPRESSION / ASSESSMENT AND PLAN / ED COURSE  Pertinent labs & imaging results that were available during my care of the patient were reviewed by me and considered in my medical decision making (see chart for details).  Todd E Fero Sr. is a 70 y.o. male with history of hypertension presents for evaluation of 3 days left arm tingling as well as approximately one hour of slurred speech which is completely resolved at this time. On exam, he is well-appearing and in no acute distress. Vital signs are stable, he is afebrile. Code stroke initiated on the patient's arrival and telemetry neurologist on-call has evaluated the patient. The patient is not a candidate for tPA given onset of symptoms 3 days ago, rapid resolution of his speech difficulty and minimal deficits at this time, his current NIH stroke scale is 1. Teleneurologist recommends admission for further stroke workup. We'll give full dose aspirin. CT head was  negative. CBC, CMP, troponin and coags unremarkable. ____________________________________________   FINAL CLINICAL IMPRESSION(S) / ED DIAGNOSES  Final diagnoses:  Cerebral infarction due to unspecified mechanism      Gayla Doss, MD 12/22/15 [phone]

## 2015-12-22 NOTE — ED Notes (Signed)
Dr. Freida Busman, neurologist, on phone with patient.

## 2015-12-22 NOTE — ED Notes (Signed)
Per patients family, his speech has improved greatly since they first noticed a change.

## 2015-12-22 NOTE — ED Notes (Signed)
Pt c/o left arm tingling like it is asleep for the past 3 days, denies loss of sensation or strength, states today about PTA he came home from playing golf and his wife asked why he was slurring his words.  No facial droop noted.. Daughter is with pt and states he does not appear to have any changes to her.Marland Kitchen

## 2015-12-23 ENCOUNTER — Observation Stay: Payer: Medicare Other

## 2015-12-23 DIAGNOSIS — G451 Carotid artery syndrome (hemispheric): Secondary | ICD-10-CM | POA: Diagnosis not present

## 2015-12-23 LAB — LIPID PANEL
Cholesterol: 209 mg/dL — ABNORMAL HIGH (ref 0–200)
HDL: 40 mg/dL — ABNORMAL LOW (ref >40)
LDL CALC: 141 mg/dL — AB (ref 0–99)
TRIGLYCERIDES: 142 mg/dL (ref <150)
Total CHOL/HDL Ratio: 5.2 RATIO
VLDL: 28 mg/dL (ref 0–40)

## 2015-12-23 LAB — ECHOCARDIOGRAM COMPLETE
HEIGHTINCHES: 67 in
WEIGHTICAEL: 2800 [oz_av]

## 2015-12-23 MED ORDER — ATORVASTATIN CALCIUM 20 MG PO TABS: 40.0000 mg | ORAL_TABLET | Freq: Every day | ORAL | Status: DC

## 2015-12-23 MED ORDER — ATORVASTATIN CALCIUM 40 MG PO TABS: 40.0000 mg | ORAL_TABLET | Freq: Every day | ORAL | Status: DC

## 2015-12-23 MED ORDER — ASPIRIN 81 MG PO TBEC: 81.0000 mg | DELAYED_RELEASE_TABLET | Freq: Every day | ORAL | Status: DC

## 2015-12-23 NOTE — Discharge Summary (Signed)
[facility] - [place] at [facility]   PATIENT NAME: Todd Hansen    MR#:  [id number]  DATE OF BIRTH:  Feb 19, 1946  DATE OF ADMISSION:  12/22/2015 ADMITTING PHYSICIAN: Houston Siren, MD  DATE OF DISCHARGE: 12/23/2015  PRIMARY CARE PHYSICIAN: Barbette Reichmann, MD    ADMISSION DIAGNOSIS:  Cerebral infarction due to unspecified mechanism [I63.9]  DISCHARGE DIAGNOSIS:  Active Problems:   TIA (transient ischemic attack)   SECONDARY DIAGNOSIS:   Past Medical History  Diagnosis Date  . Hypertension   . Gallstones   . Enlarged prostate   . Diverticulitis   . Hyperthyroidism     HOSPITAL COURSE:   70 year old male with past medical history of hypertension, hyperthyroidism, diverticulitis presents to the hospital due to left upper extremity numbness and tingling and slurred speech.  1. TIA- given the patient's transient neurologic symptoms which have improved presently. -Patient's CT head on admission is negative. Negative MRI of his brain, carotid duplex, Normal 2-D echocardiogram. -Started him on a baby aspirin.  Check lipid profile.-LDL 141 , started statin high intensity  -Swallowing function is normal  -No PT needs identified   okay to discharge patient from neurology standpoint as MRI is normal  2. Essential hypertension-continue losartan.  3. Hyperthyroidism-continue propylthiouracil   4. Hperlipidemia stared him on hgh intensity statin in view of TIA  DISCHARGE CONDITIONS:   Fair  CONSULTS OBTAINED:  Treatment Team:  Pauletta Browns, MD   PROCEDURES none  DRUG ALLERGIES:   Allergies  Allergen Reactions  . Ciprofloxacin Rash    DISCHARGE MEDICATIONS:   Current Discharge Medication List    START taking these medications   Details  aspirin EC 81 MG EC tablet Take 1 tablet (81 mg total) by mouth daily.    atorvastatin (LIPITOR) 40 MG tablet Take 1 tablet (40 mg total) by mouth daily at 6 PM. Qty: 30 tablet,  Refills: 0      CONTINUE these medications which have NOT CHANGED   Details  losartan (COZAAR) 50 MG tablet Take 50 mg by mouth daily. Refills: 0    naproxen sodium (ANAPROX) 220 MG tablet Take 220 mg by mouth 2 (two) times daily as needed.    propylthiouracil (PTU) 50 MG tablet Take 25 mg by mouth 2 (two) times daily. Refills: 0         DISCHARGE INSTRUCTIONS:    activity as tolerated  Diet cardiac Follow-up with primary care physician in a week Follow-up with neurology in 2 months  DIET:  Cardiac diet  DISCHARGE CONDITION:  Fair  ACTIVITY:  Activity as tolerated  OXYGEN:  Home Oxygen: No.   Oxygen Delivery: room air  DISCHARGE LOCATION:  home   If you experience worsening of your admission symptoms, develop shortness of breath, life threatening emergency, suicidal or homicidal thoughts you must seek medical attention immediately by calling 911 or calling your MD immediately  if symptoms less severe.  You Must read complete instructions/literature along with all the possible adverse reactions/side effects for all the Medicines you take and that have been prescribed to you. Take any new Medicines after you have completely understood and accpet all the possible adverse reactions/side effects.   Please note  You were cared for by a hospitalist during your hospital stay. If you have any questions about your discharge medications or the care you received while you were in the hospital after you are discharged, you can call the unit and asked to speak with the hospitalist on call  if the hospitalist that took care of you is not available. Once you are discharged, your primary care physician will handle any further medical issues. Please note that NO REFILLS for any discharge medications will be authorized once you are discharged, as it is imperative that you return to your primary care physician (or establish a relationship with a primary care physician if you do not have  one) for your aftercare needs so that they can reassess your need for medications and monitor your lab values.     Today  Chief Complaint  Patient presents with  . Code Stroke   Patient is resting comfortably. Denies any headache or blurry vision. Swallowing function is fine. Denies any weakness  ROS:  CONSTITUTIONAL: Denies fevers, chills. Denies any fatigue, weakness.  EYES: Denies blurry vision, double vision, eye pain. EARS, NOSE, THROAT: Denies tinnitus, ear pain, hearing loss. RESPIRATORY: Denies cough, wheeze, shortness of breath.  CARDIOVASCULAR: Denies chest pain, palpitations, edema.  GASTROINTESTINAL: Denies nausea, vomiting, diarrhea, abdominal pain. Denies bright red blood per rectum. GENITOURINARY: Denies dysuria, hematuria. ENDOCRINE: Denies nocturia or thyroid problems. HEMATOLOGIC AND LYMPHATIC: Denies easy bruising or bleeding. SKIN: Denies rash or lesion. MUSCULOSKELETAL: Denies pain in neck, back, shoulder, knees, hips or arthritic symptoms.  NEUROLOGIC: Denies paralysis, paresthesias.  PSYCHIATRIC: Denies anxiety or depressive symptoms.   VITAL SIGNS:  Blood pressure 141/95, pulse 64, temperature 97.8 F (36.6 C), temperature source Oral, resp. rate 20, height 5\' 7"  (1.702 m), weight 79.379 kg (175 lb), SpO2 94 %.  I/O:    Intake/Output Summary (Last 24 hours) at 12/23/15 1521 Last data filed at 12/23/15 1300  Gross per 24 hour  Intake    720 ml  Output      0 ml  Net    720 ml    PHYSICAL EXAMINATION:  GENERAL:  70 y.o.-year-old patient lying in the bed with no acute distress.  EYES: Pupils equal, round, reactive to light and accommodation. No scleral icterus. Extraocular muscles intact.  HEENT: Head atraumatic, normocephalic. Oropharynx and nasopharynx clear.  NECK:  Supple, no jugular venous distention. No thyroid enlargement, no tenderness.  LUNGS: Normal breath sounds bilaterally, no wheezing, rales,rhonchi or crepitation. No use of accessory  muscles of respiration.  CARDIOVASCULAR: S1, S2 normal. No murmurs, rubs, or gallops.  ABDOMEN: Soft, non-tender, non-distended. Bowel sounds present. No organomegaly or mass.  EXTREMITIES: No pedal edema, cyanosis, or clubbing.  NEUROLOGIC: Cranial nerves II through XII are intact. Muscle strength 5/5 in all extremities. Sensation intact. Gait not checked.  PSYCHIATRIC: The patient is alert and oriented x 3.  SKIN: No obvious rash, lesion, or ulcer.   DATA REVIEW:   CBC  Recent Labs Lab 12/22/15 1337  WBC 9.4  HGB 16.0  HCT 46.9  PLT 218    Chemistries   Recent Labs Lab 12/22/15 1337  NA 137  K 4.1  CL 110  CO2 22  GLUCOSE 95  BUN 25*  CREATININE 0.99  CALCIUM 9.2  AST 26  ALT 32  ALKPHOS 49  BILITOT 0.7    Cardiac Enzymes  Recent Labs Lab 12/22/15 1337  TROPONINI <0.03    Microbiology Results  Results for orders placed or performed in visit on 10/23/13  Stool culture     Status: None   Collection Time: 10/23/13 11:31 AM  Result Value Ref Range Status   Micro Text Report   Final       COMMENT  NO SALMONELLA OR SHIGELLA ISOLATED   COMMENT                   NO PATHOGENIC E.COLI DETECTED   COMMENT                   NO CAMPYLOBACTER ANTIGEN DETECTED   ANTIBIOTIC                                                      Clostridium Difficile [facility])     Status: None   Collection Time: 10/23/13 11:31 AM  Result Value Ref Range Status   Micro Text Report   Final       C.DIFFICILE ANTIGEN       C.DIFFICILE GDH ANTIGEN : NEGATIVE   C.DIFFICILE TOXIN A/B     C.DIFFICILE TOXINS A AND B : NEGATIVE   INTERPRETATION            Negative for C. difficile.    ANTIBIOTIC                                                        RADIOLOGY:  Ct Head Wo Contrast  12/22/2015  CLINICAL DATA:  Left arm tingling for the past 3 days with possible slurring words today. Code stroke. EXAM: CT HEAD WITHOUT CONTRAST TECHNIQUE: Contiguous axial images were  obtained from the base of the skull through the vertex without intravenous contrast. COMPARISON:  Brain MRI 12/31/2012 FINDINGS: Skull and Sinuses:Negative for fracture or destructive process. Chronic advanced atlantodental degenerative disease. Visualized orbits: Bilateral cataract resection Brain: Unremarkable. No evidence of acute infarction, hemorrhage, hydrocephalus, or mass lesion/mass effect. These results were called by telephone at the time of interpretation on 12/22/2015 at 1:41 pm to Dr. Sharman Cheek , who verbally acknowledged these results. IMPRESSION: Negative study.  No hemorrhage or evidence of infarct. Electronically Signed   By: Marnee Spring M.D.   On: 12/22/2015 13:41   Mr Brain Wo Contrast  12/23/2015  CLINICAL DATA:  New onset of left upper extremity numbness and tingling. Slurred speech. Symptoms over the last 2-3 days. Slurred speech began today. EXAM: MRI HEAD WITHOUT CONTRAST TECHNIQUE: Multiplanar, multiecho pulse sequences of the brain and surrounding structures were obtained without intravenous contrast. COMPARISON:  CT head without contrast 12/22/2015. Carotid Doppler ultrasound 12/22/2015. FINDINGS: The diffusion weighted images demonstrate no evidence for acute or subacute infarction. Mild periventricular and scattered subcortical T2 changes are evident bilaterally. The ventricles are proportionate to mild atrophy. No significant extra-axial fluid collection is present. The internal auditory canals are within normal limits bilaterally. Flow is present in the major intracranial arteries. The globes and orbits are intact. The paranasal sinuses and the mastoid air cells are clear. Skullbase is within normal limits. Midline sagittal images are unremarkable. IMPRESSION: 1. Mild age advanced periventricular and subcortical white matter changes bilaterally. These are nonspecific, but likely reflect the sequela of chronic microvascular ischemia. 2. No acute intracranial abnormality.  Electronically Signed   By: Marin Roberts M.D.   On: 12/23/2015 14:41   US Carotid Bilateral  12/23/2015  CLINICAL DATA:  Left arm numbness. EXAM: BILATERAL CAROTID DUPLEX ULTRASOUND TECHNIQUE:  Gray scale imaging, color Doppler and duplex ultrasound were performed of bilateral carotid and vertebral arteries in the neck. COMPARISON:  CT 12/22/2015. FINDINGS: Criteria: Quantification of carotid stenosis is based on velocity parameters that correlate the residual internal carotid diameter with NASCET-based stenosis levels, using the diameter of the distal internal carotid lumen as the denominator for stenosis measurement. The following velocity measurements were obtained: RIGHT ICA:  70/20 cm/sec CCA:  116/16 cm/sec SYSTOLIC ICA/CCA RATIO:  0.6 DIASTOLIC ICA/CCA RATIO:  0.9 ECA:  122 cm/sec LEFT ICA:  59/10 cm/sec CCA:  134/19 cm/sec SYSTOLIC ICA/CCA RATIO:  0.4 DIASTOLIC ICA/CCA RATIO:  0.5 ECA:  109 cm/sec RIGHT CAROTID ARTERY: Mild right carotid bifurcation plaque. No flow limiting stenosis. RIGHT VERTEBRAL ARTERY:  Patent with antegrade flow. LEFT CAROTID ARTERY: Mild left carotid bifurcation plaque. No flow limiting stenosis. LEFT VERTEBRAL ARTERY:  Patent with antegrade flow. IMPRESSION: 1. Mild bilateral carotid bifurcation plaque. No flow limiting stenosis. Degree of stenosis less than 50%. 2. Vertebral arteries are patent antegrade flow. Electronically Signed   By: Maisie Fus  Register   On: 12/23/2015 07:05    EKG:   Orders placed or performed during the hospital encounter of 12/22/15  . ED EKG  . ED EKG  . EKG 12-Lead  . EKG 12-Lead      Management plans discussed with the patient, family and they are in agreement.  CODE STATUS:     Code Status Orders        Start     Ordered   12/22/15 1708  Full code   Continuous     12/22/15 1707    Code Status History    Date Active Date Inactive Code Status Order ID Comments User Context   This patient has a current code status but no  historical code status.      TOTAL TIME TAKING CARE OF THIS PATIENT: 45  minutes.    @[facility] @  on 12/23/2015 at 3:21 PM  Between 7am to 6pm - Pager - [phone]  After 6pm go to [email or web address] - password [email or web address] [facility]  [facility]  Office  [phone]  CC: Primary care physician; Barbette Reichmann, MD

## 2015-12-23 NOTE — Care Management (Signed)
Admitted to [facility] with the diagnosis of TIA. Lives with wife, Jola Babinski [phone]). Last seen Dr. Marcello Fennel 2-3 months ago. No home health. No skilled facility. No home oxygen. Takes care of all basic and instrumental activities of daily living himself. Uses no aids for ambulation. No falls. Good appetite.  Family will transport. Gwenette Greet RN MSN CCM Care management [phone]

## 2015-12-23 NOTE — Evaluation (Signed)
Physical Therapy Evaluation Patient Details Name: Todd Hansen. MRN: [id number] DOB: 02-01-1946 Today's Date: 12/23/2015   History of Present Illness  Pt is a 70 y.o. male with PMH of HTN, diverticulitis, and gallstones.  Pt presented with L UE numbness, slurred speech and R facial droop.  Pt was admitted for a TIA.      Clinical Impression  Prior to admission pt was independent and played golf and bowled numerous times per week.  Pt lives with wife.  Pt was independent with bed mobility.  Pt was supervision with one set of sit to stand with RW and independent with no AD for second set of sit to stand.  Pt was supervision for 160 feet and independent for 180 feet of ambulation (No Ad used).  Pt reported decreased sensation on L UE C4 and C5 dermatomes.  No other sensory, proprioceptive or strength deficits noted.   It is recommended that pt is discharged to home with familial support when medically appropriate. Pt appears to be at baseline and does not require further PT services.  PT will complete PT order.     Follow Up Recommendations No PT follow up    Equipment Recommendations  None recommended by PT    Recommendations for Other Services       Precautions / Restrictions Precautions Precautions: None Restrictions Weight Bearing Restrictions: No      Mobility  Bed Mobility Overal bed mobility: Independent                Transfers Overall transfer level: Independent Equipment used: Rolling walker (2 wheeled);None Transfers: Sit to/from Stand Sit to Stand: Supervision;Independent (one set with RW, one set with no AD )            Ambulation/Gait Ambulation/Gait assistance: Supervision;Independent Ambulation Distance (Feet): 380 Feet (1 lap with supervision, one lap independent ) Assistive device: None Gait Pattern/deviations: WFL(Within Functional Limits)        Stairs            Wheelchair Mobility    Modified Rankin (Stroke Patients Only)        Balance Overall balance assessment: Independent        Tinetti Balance Assessment:  Overall:  28/28 (Low risk of falls) Balance:  16/16 Gait: 12/12                                   Pertinent Vitals/Pain Pain Assessment: No/denies pain  See flow sheet for vitals.     Home Living Family/patient expects to be discharged to:: Private residence Living Arrangements: Spouse/significant other Available Help at Discharge: Family Type of Home: House Home Access: Level entry     Home Layout: One level Home Equipment: None      Prior Function Level of Independence: Independent               Hand Dominance        Extremity/Trunk Assessment   Upper Extremity Assessment: Overall WFL for tasks assessed    Sensation:  C3: B WNL C4-C5: R: WNL, L: decreased C6-T1:  B WNL    Myotomes:  C3-T1: B WNL  [employer]:  B WNL  Proprioception:  B thumbs WNL   Lower Extremity Assessment: Overall WFL for tasks assessed    Sensation:  L1-S1:  B WNL Strength:   Hip flexors, quadriceps, hamstrings, dorsiflexors, hip abductors, hip adductors:  B at least a 3/5  Proprioception:  B Great toe WNL      Cervical / Trunk Assessment: Normal  Communication   Communication: No difficulties  Cognition Arousal/Alertness: Awake/alert Behavior During Therapy: WFL for tasks assessed/performed Overall Cognitive Status: Within Functional Limits for tasks assessed                      General Comments   Nursing was contacted and cleared pt for physical therapy.  Pt was agreeable and tolerated session well.  Pt's daughter was present during session.       Exercises        Assessment/Plan    PT Assessment Patent does not need any further PT services  PT Diagnosis     PT Problem List    PT Treatment Interventions     PT Goals (Current goals can be found in the Care Plan section) Acute Rehab PT Goals Patient Stated Goal: to go home  PT Goal  Formulation: With patient Time For Goal Achievement: 01/06/16 Potential to Achieve Goals: Good    Frequency     Barriers to discharge        Co-evaluation               End of Session Equipment Utilized During Treatment: Gait belt Activity Tolerance: Patient tolerated treatment well Patient left: in bed;with family/visitor present Nurse Communication: Mobility status         Time: 1020-1044 PT Time Calculation (min) (ACUTE ONLY): 24 min   Charges:         PT G Codes:       Lyndel Safe, SPT  Lyndel Safe 12/23/2015, 11:02 AM

## 2015-12-23 NOTE — Consult Note (Signed)
CC: L sided weakness and slurred speech.    HPI: [employer] Sr[employer] is an 70 y.o. malewith a known history of Hypertension, diverticulitis, hyperthyroidism, who presents to the hospital due to left upper extremity numbness and tingling and also some slurred speech started yesterday. Patient says that for the past 2-3 days she's been having some left hand and upper extremity numbness and tingling associated with some shoulder pain on the left side. Today his wife noticed that his speech was also slurred and his right side of his face looking bit twisted. Symptoms improved and thinks close to baseline outside of L arm numbness.  CTH no acute abnormalities.    Past Medical History  Diagnosis Date  . Hypertension   . Gallstones   . Enlarged prostate   . Diverticulitis   . Hyperthyroidism     History reviewed. No pertinent past surgical history.  Family History  Problem Relation Age of Onset  . Heart disease Mother   . Heart disease Father     Social History:  reports that he has never smoked. He does not have any smokeless tobacco history on file. He reports that he drinks alcohol. His drug history is not on file.  Allergies  Allergen Reactions  . Ciprofloxacin Rash    Medications: I have reviewed the patient's current medications.  ROS: History obtained from the patient  General ROS: negative for - chills, fatigue, fever, night sweats, weight gain or weight loss Psychological ROS: negative for - behavioral disorder, hallucinations, memory difficulties, mood swings or suicidal ideation Ophthalmic ROS: negative for - blurry vision, double vision, eye pain or loss of vision ENT ROS: negative for - epistaxis, nasal discharge, oral lesions, sore throat, tinnitus or vertigo Allergy and Immunology ROS: negative for - hives or itchy/watery eyes Hematological and Lymphatic ROS: negative for - bleeding problems, bruising or swollen lymph nodes Endocrine ROS: negative for -  galactorrhea, hair pattern changes, polydipsia/polyuria or temperature intolerance Respiratory ROS: negative for - cough, hemoptysis, shortness of breath or wheezing Cardiovascular ROS: negative for - chest pain, dyspnea on exertion, edema or irregular heartbeat Gastrointestinal ROS: negative for - abdominal pain, diarrhea, hematemesis, nausea/vomiting or stool incontinence Genito-Urinary ROS: negative for - dysuria, hematuria, incontinence or urinary frequency/urgency Musculoskeletal ROS: negative for - joint swelling or muscular weakness Neurological ROS: as noted in HPI Dermatological ROS: negative for rash and skin lesion changes  Physical Examination: Blood pressure 111/77, pulse 64, temperature 97.6 F (36.4 C), temperature source Oral, resp. rate 18, height 5\' 7"  (1.702 m), weight 175 lb (79.379 kg), SpO2 94 %.   Neurological Examination Mental Status: Alert, oriented, thought content appropriate.  Speech fluent without evidence of aphasia.  Able to follow 3 step commands without difficulty. Cranial Nerves: II: Discs flat bilaterally; Visual fields grossly normal, pupils equal, round, reactive to light and accommodation III,IV, VI: ptosis not present, extra-ocular motions intact bilaterally V,VII: smile symmetric, facial light touch sensation normal bilaterally VIII: hearing normal bilaterally IX,X: gag reflex present XI: bilateral shoulder shrug XII: midline tongue extension Motor: Right : Upper extremity   5/5    Left:     Upper extremity   5/5  Lower extremity   5/5     Lower extremity   5/5 Tone and bulk:normal tone throughout; no atrophy noted Sensory: subjective numbness on LUE Deep Tendon Reflexes: 2+ and symmetric throughout Plantars: Right: downgoing   Left: downgoing Cerebellar: normal finger-to-nose, normal rapid alternating movements and normal heel-to-shin test Gait: not tested.  Laboratory Studies:   Basic Metabolic Panel:  Recent Labs Lab  12/22/15 1337  NA 137  K 4.1  CL 110  CO2 22  GLUCOSE 95  BUN 25*  CREATININE 0.99  CALCIUM 9.2    Liver Function Tests:  Recent Labs Lab 12/22/15 1337  AST 26  ALT 32  ALKPHOS 49  BILITOT 0.7  PROT 7.9  ALBUMIN 4.1   No results for input(s): LIPASE, AMYLASE in the last 168 hours. No results for input(s): AMMONIA in the last 168 hours.  CBC:  Recent Labs Lab 12/22/15 1337  WBC 9.4  NEUTROABS 6.3  HGB 16.0  HCT 46.9  MCV 90.0  PLT 218    Cardiac Enzymes:  Recent Labs Lab 12/22/15 1337  TROPONINI <0.03    BNP: Invalid input(s): POCBNP  CBG:  Recent Labs Lab 12/22/15 1352  GLUCAP 92    Microbiology: Results for orders placed or performed in visit on 10/23/13  Stool culture     Status: None   Collection Time: 10/23/13 11:31 AM  Result Value Ref Range Status   Micro Text Report   Final       COMMENT                   NO SALMONELLA OR SHIGELLA ISOLATED   COMMENT                   NO PATHOGENIC E.COLI DETECTED   COMMENT                   NO CAMPYLOBACTER ANTIGEN DETECTED   ANTIBIOTIC                                                      Clostridium Difficile [facility])     Status: None   Collection Time: 10/23/13 11:31 AM  Result Value Ref Range Status   Micro Text Report   Final       C.DIFFICILE ANTIGEN       C.DIFFICILE GDH ANTIGEN : NEGATIVE   C.DIFFICILE TOXIN A/B     C.DIFFICILE TOXINS A AND B : NEGATIVE   INTERPRETATION            Negative for C. difficile.    ANTIBIOTIC                                                        Coagulation Studies:  Recent Labs  12/22/15 1337  LABPROT 13.2  INR 0.98    Urinalysis: No results for input(s): COLORURINE, LABSPEC, PHURINE, GLUCOSEU, HGBUR, BILIRUBINUR, KETONESUR, PROTEINUR, UROBILINOGEN, NITRITE, LEUKOCYTESUR in the last 168 hours.  Invalid input(s): APPERANCEUR  Lipid Panel:     Component Value Date/Time   CHOL 209* 12/23/2015 0442   TRIG 142 12/23/2015 0442   HDL 40*  12/23/2015 0442   CHOLHDL 5.2 12/23/2015 0442   VLDL 28 12/23/2015 0442   LDLCALC 141* 12/23/2015 0442    HgbA1C: No results found for: HGBA1C  Urine Drug Screen:  No results found for: LABOPIA, COCAINSCRNUR, LABBENZ, AMPHETMU, THCU, LABBARB  Alcohol Level: No results for input(s): ETH in the last 168 hours.  Other results:  EKG: normal EKG, normal sinus rhythm, unchanged from previous tracings.  Imaging: Ct Head Wo Contrast  12/22/2015  CLINICAL DATA:  Left arm tingling for the past 3 days with possible slurring words today. Code stroke. EXAM: CT HEAD WITHOUT CONTRAST TECHNIQUE: Contiguous axial images were obtained from the base of the skull through the vertex without intravenous contrast. COMPARISON:  Brain MRI 12/31/2012 FINDINGS: Skull and Sinuses:Negative for fracture or destructive process. Chronic advanced atlantodental degenerative disease. Visualized orbits: Bilateral cataract resection Brain: Unremarkable. No evidence of acute infarction, hemorrhage, hydrocephalus, or mass lesion/mass effect. These results were called by telephone at the time of interpretation on 12/22/2015 at 1:41 pm to Dr. Sharman Cheek , who verbally acknowledged these results. IMPRESSION: Negative study.  No hemorrhage or evidence of infarct. Electronically Signed   By: Marnee Spring M.D.   On: 12/22/2015 13:41   US Carotid Bilateral  12/23/2015  CLINICAL DATA:  Left arm numbness. EXAM: BILATERAL CAROTID DUPLEX ULTRASOUND TECHNIQUE: Wallace Cullens scale imaging, color Doppler and duplex ultrasound were performed of bilateral carotid and vertebral arteries in the neck. COMPARISON:  CT 12/22/2015. FINDINGS: Criteria: Quantification of carotid stenosis is based on velocity parameters that correlate the residual internal carotid diameter with NASCET-based stenosis levels, using the diameter of the distal internal carotid lumen as the denominator for stenosis measurement. The following velocity measurements were obtained: RIGHT  ICA:  70/20 cm/sec CCA:  116/16 cm/sec SYSTOLIC ICA/CCA RATIO:  0.6 DIASTOLIC ICA/CCA RATIO:  0.9 ECA:  122 cm/sec LEFT ICA:  59/10 cm/sec CCA:  134/19 cm/sec SYSTOLIC ICA/CCA RATIO:  0.4 DIASTOLIC ICA/CCA RATIO:  0.5 ECA:  109 cm/sec RIGHT CAROTID ARTERY: Mild right carotid bifurcation plaque. No flow limiting stenosis. RIGHT VERTEBRAL ARTERY:  Patent with antegrade flow. LEFT CAROTID ARTERY: Mild left carotid bifurcation plaque. No flow limiting stenosis. LEFT VERTEBRAL ARTERY:  Patent with antegrade flow. IMPRESSION: 1. Mild bilateral carotid bifurcation plaque. No flow limiting stenosis. Degree of stenosis less than 50%. 2. Vertebral arteries are patent antegrade flow. Electronically Signed   By: Maisie Fus  Register   On: 12/23/2015 07:05     Assessment/Plan:  70 y.o. malewith a known history of Hypertension, diverticulitis, hyperthyroidism, who presents to the hospital due to left upper extremity numbness and tingling and also some slurred speech started yesterday. Patient says that for the past 2-3 days she's been having some left hand and upper extremity numbness and tingling associated with some shoulder pain on the left side. Today his wife noticed that his speech was also slurred and his right side of his face looking bit twisted. Symptoms improved and thinks close to baseline outside of L arm numbness.  CTH no acute abnormalities.    - Awaiting MRI brain - elevated LDL -2d echo, carotid doppler - was not on any anti platelet prior to admission - ASA/Statin daily - Pt/OT - after above testing completed, can likely be d/c later today.   Pauletta Browns  12/23/2015, 9:54 AM

## 2015-12-23 NOTE — Care Management Obs Status (Signed)
MEDICARE OBSERVATION STATUS NOTIFICATION   Patient Details  Name: Todd CROSWELL Sr. MRN: [id number] Date of Birth: Oct 08, 1945   Medicare Observation Status Notification Given:  Yes    Gwenette Greet, RN 12/23/2015, 9:55 AM

## 2015-12-23 NOTE — Discharge Instructions (Signed)
activity as tolerated  Diet cardiac Follow-up with primary care physician in a week Follow-up with neurology in 2 months

## 2016-11-05 ENCOUNTER — Other Ambulatory Visit: Payer: Self-pay | Admitting: Physical Medicine and Rehabilitation

## 2016-11-05 DIAGNOSIS — M5416 Radiculopathy, lumbar region: Secondary | ICD-10-CM

## 2016-11-15 ENCOUNTER — Ambulatory Visit
Admission: RE | Admit: 2016-11-15 | Discharge: 2016-11-15 | Disposition: A | Discharge status: Home / Self Care | Payer: Medicare Other | Source: Ambulatory Visit | Attending: Physical Medicine and Rehabilitation | Admitting: Physical Medicine and Rehabilitation

## 2016-11-15 DIAGNOSIS — M5136 Other intervertebral disc degeneration, lumbar region: Secondary | ICD-10-CM | POA: Insufficient documentation

## 2016-11-15 DIAGNOSIS — M48061 Spinal stenosis, lumbar region without neurogenic claudication: Secondary | ICD-10-CM | POA: Diagnosis not present

## 2016-11-15 DIAGNOSIS — M4804 Spinal stenosis, thoracic region: Secondary | ICD-10-CM | POA: Diagnosis not present

## 2016-11-15 DIAGNOSIS — M5416 Radiculopathy, lumbar region: Secondary | ICD-10-CM

## 2017-01-11 DIAGNOSIS — M1711 Unilateral primary osteoarthritis, right knee: Secondary | ICD-10-CM | POA: Insufficient documentation

## 2017-03-25 ENCOUNTER — Other Ambulatory Visit: Payer: Self-pay | Admitting: Orthopedic Surgery

## 2017-03-25 DIAGNOSIS — M1712 Unilateral primary osteoarthritis, left knee: Secondary | ICD-10-CM

## 2017-03-30 ENCOUNTER — Ambulatory Visit
Admission: RE | Admit: 2017-03-30 | Discharge: 2017-03-30 | Disposition: A | Discharge status: Home / Self Care | Payer: Medicare Other | Source: Ambulatory Visit | Attending: Orthopedic Surgery | Admitting: Orthopedic Surgery

## 2017-03-30 DIAGNOSIS — M2242 Chondromalacia patellae, left knee: Secondary | ICD-10-CM | POA: Diagnosis not present

## 2017-03-30 DIAGNOSIS — M1712 Unilateral primary osteoarthritis, left knee: Secondary | ICD-10-CM | POA: Diagnosis not present

## 2017-03-30 DIAGNOSIS — S83242A Other tear of medial meniscus, current injury, left knee, initial encounter: Secondary | ICD-10-CM | POA: Insufficient documentation

## 2017-03-30 DIAGNOSIS — M25462 Effusion, left knee: Secondary | ICD-10-CM | POA: Diagnosis not present

## 2017-08-29 DIAGNOSIS — Z96652 Presence of left artificial knee joint: Secondary | ICD-10-CM | POA: Insufficient documentation

## 2019-02-16 DIAGNOSIS — M720 Palmar fascial fibromatosis [Dupuytren]: Secondary | ICD-10-CM | POA: Insufficient documentation

## 2019-07-28 ENCOUNTER — Other Ambulatory Visit
Admission: RE | Admit: 2019-07-28 | Discharge: 2019-07-28 | Disposition: A | Discharge status: Home / Self Care | Payer: Medicare Other | Source: Ambulatory Visit | Attending: Internal Medicine | Admitting: Internal Medicine

## 2019-07-28 DIAGNOSIS — R0602 Shortness of breath: Secondary | ICD-10-CM | POA: Diagnosis not present

## 2019-07-28 DIAGNOSIS — Z79899 Other long term (current) drug therapy: Secondary | ICD-10-CM | POA: Diagnosis not present

## 2019-07-28 DIAGNOSIS — Z841 Family history of disorders of kidney and ureter: Secondary | ICD-10-CM | POA: Insufficient documentation

## 2019-07-28 DIAGNOSIS — Z8249 Family history of ischemic heart disease and other diseases of the circulatory system: Secondary | ICD-10-CM | POA: Diagnosis not present

## 2019-07-28 DIAGNOSIS — Z01818 Encounter for other preprocedural examination: Secondary | ICD-10-CM | POA: Insufficient documentation

## 2019-07-28 DIAGNOSIS — Z96652 Presence of left artificial knee joint: Secondary | ICD-10-CM | POA: Diagnosis not present

## 2019-07-28 DIAGNOSIS — I209 Angina pectoris, unspecified: Secondary | ICD-10-CM | POA: Insufficient documentation

## 2019-07-28 DIAGNOSIS — E669 Obesity, unspecified: Secondary | ICD-10-CM | POA: Insufficient documentation

## 2019-07-28 DIAGNOSIS — I10 Essential (primary) hypertension: Secondary | ICD-10-CM | POA: Diagnosis not present

## 2019-07-28 DIAGNOSIS — Z8673 Personal history of transient ischemic attack (TIA), and cerebral infarction without residual deficits: Secondary | ICD-10-CM | POA: Insufficient documentation

## 2019-07-28 DIAGNOSIS — Z87891 Personal history of nicotine dependence: Secondary | ICD-10-CM | POA: Insufficient documentation

## 2019-07-28 DIAGNOSIS — Z7982 Long term (current) use of aspirin: Secondary | ICD-10-CM | POA: Diagnosis not present

## 2019-07-28 DIAGNOSIS — Z881 Allergy status to other antibiotic agents status: Secondary | ICD-10-CM | POA: Diagnosis not present

## 2019-07-28 LAB — BRAIN NATRIURETIC PEPTIDE: B Natriuretic Peptide: 24 pg/mL (ref 0.0–100.0)

## 2019-07-30 ENCOUNTER — Other Ambulatory Visit: Admission: RE | Admit: 2019-07-30 | Payer: Medicare Other | Source: Ambulatory Visit

## 2019-07-31 ENCOUNTER — Other Ambulatory Visit: Payer: Self-pay

## 2019-07-31 ENCOUNTER — Other Ambulatory Visit
Admission: RE | Admit: 2019-07-31 | Discharge: 2019-07-31 | Disposition: A | Discharge status: Home / Self Care | Payer: Medicare Other | Source: Ambulatory Visit | Attending: Internal Medicine | Admitting: Internal Medicine

## 2019-07-31 DIAGNOSIS — Z01812 Encounter for preprocedural laboratory examination: Secondary | ICD-10-CM | POA: Insufficient documentation

## 2019-07-31 DIAGNOSIS — Z20828 Contact with and (suspected) exposure to other viral communicable diseases: Secondary | ICD-10-CM | POA: Insufficient documentation

## 2019-07-31 LAB — SARS CORONAVIRUS 2 (TAT 6-24 HRS): SARS Coronavirus 2: NEGATIVE

## 2019-08-03 ENCOUNTER — Ambulatory Visit
Admission: RE | Admit: 2019-08-03 | Discharge: 2019-08-03 | Disposition: A | Discharge status: Home / Self Care | Payer: Medicare Other | Attending: Internal Medicine | Admitting: Internal Medicine

## 2019-08-03 ENCOUNTER — Encounter
Admission: RE | Disposition: A | Discharge status: Home / Self Care | Payer: Self-pay | Source: Home / Self Care | Attending: Internal Medicine

## 2019-08-03 ENCOUNTER — Other Ambulatory Visit: Payer: Self-pay

## 2019-08-03 DIAGNOSIS — I10 Essential (primary) hypertension: Secondary | ICD-10-CM | POA: Insufficient documentation

## 2019-08-03 DIAGNOSIS — Z7982 Long term (current) use of aspirin: Secondary | ICD-10-CM | POA: Insufficient documentation

## 2019-08-03 DIAGNOSIS — I25110 Atherosclerotic heart disease of native coronary artery with unstable angina pectoris: Secondary | ICD-10-CM | POA: Insufficient documentation

## 2019-08-03 DIAGNOSIS — Z96652 Presence of left artificial knee joint: Secondary | ICD-10-CM | POA: Insufficient documentation

## 2019-08-03 DIAGNOSIS — Z87891 Personal history of nicotine dependence: Secondary | ICD-10-CM | POA: Diagnosis not present

## 2019-08-03 DIAGNOSIS — Z6830 Body mass index (BMI) 30.0-30.9, adult: Secondary | ICD-10-CM | POA: Insufficient documentation

## 2019-08-03 DIAGNOSIS — E0500 Thyrotoxicosis with diffuse goiter without thyrotoxic crisis or storm: Secondary | ICD-10-CM | POA: Diagnosis not present

## 2019-08-03 DIAGNOSIS — M1990 Unspecified osteoarthritis, unspecified site: Secondary | ICD-10-CM | POA: Insufficient documentation

## 2019-08-03 DIAGNOSIS — E669 Obesity, unspecified: Secondary | ICD-10-CM | POA: Insufficient documentation

## 2019-08-03 DIAGNOSIS — Z8249 Family history of ischemic heart disease and other diseases of the circulatory system: Secondary | ICD-10-CM | POA: Insufficient documentation

## 2019-08-03 DIAGNOSIS — Z8673 Personal history of transient ischemic attack (TIA), and cerebral infarction without residual deficits: Secondary | ICD-10-CM | POA: Insufficient documentation

## 2019-08-03 DIAGNOSIS — Z881 Allergy status to other antibiotic agents status: Secondary | ICD-10-CM | POA: Diagnosis not present

## 2019-08-03 DIAGNOSIS — N529 Male erectile dysfunction, unspecified: Secondary | ICD-10-CM | POA: Insufficient documentation

## 2019-08-03 DIAGNOSIS — Z79899 Other long term (current) drug therapy: Secondary | ICD-10-CM | POA: Diagnosis not present

## 2019-08-03 DIAGNOSIS — I200 Unstable angina: Secondary | ICD-10-CM | POA: Diagnosis present

## 2019-08-03 HISTORY — PX: LEFT HEART CATH AND CORONARY ANGIOGRAPHY: CATH118249

## 2019-08-03 SURGERY — LEFT HEART CATH AND CORONARY ANGIOGRAPHY
Anesthesia: Moderate Sedation | Laterality: Left

## 2019-08-03 MED ORDER — VERAPAMIL HCL 2.5 MG/ML IV SOLN
INTRAVENOUS | Status: AC
  Filled 2019-08-03: qty 2

## 2019-08-03 MED ORDER — ATORVASTATIN CALCIUM 80 MG PO TABS
80.0000 mg | ORAL_TABLET | Freq: Every day | ORAL | Status: DC
  Administered 2019-08-03: 80 mg via ORAL
  Filled 2019-08-03 (×2): qty 1

## 2019-08-03 MED ORDER — HEPARIN (PORCINE) IN NACL 2000-0.9 UNIT/L-% IV SOLN
INTRAVENOUS | Status: DC | PRN
  Administered 2019-08-03: 500 mL

## 2019-08-03 MED ORDER — SODIUM CHLORIDE 0.9% FLUSH: 3.0000 mL | Freq: Two times a day (BID) | INTRAVENOUS | Status: DC

## 2019-08-03 MED ORDER — MIDAZOLAM HCL 2 MG/2ML IJ SOLN
INTRAMUSCULAR | Status: AC
  Filled 2019-08-03: qty 2

## 2019-08-03 MED ORDER — HEPARIN SODIUM (PORCINE) 1000 UNIT/ML IJ SOLN
INTRAMUSCULAR | Status: AC
  Filled 2019-08-03: qty 1

## 2019-08-03 MED ORDER — SODIUM CHLORIDE 0.9 % IV SOLN: 250.0000 mL | INTRAVENOUS | Status: DC | PRN

## 2019-08-03 MED ORDER — VERAPAMIL HCL 2.5 MG/ML IV SOLN
INTRAVENOUS | Status: DC | PRN
  Administered 2019-08-03: 2.5 mg via INTRAVENOUS

## 2019-08-03 MED ORDER — HYDRALAZINE HCL 20 MG/ML IJ SOLN: INTRAMUSCULAR | Status: DC | PRN

## 2019-08-03 MED ORDER — ASPIRIN 81 MG PO CHEW
CHEWABLE_TABLET | ORAL | Status: AC
  Filled 2019-08-03: qty 1

## 2019-08-03 MED ORDER — FENTANYL CITRATE (PF) 100 MCG/2ML IJ SOLN
INTRAMUSCULAR | Status: AC
  Filled 2019-08-03: qty 2

## 2019-08-03 MED ORDER — ASPIRIN 81 MG PO CHEW
81.0000 mg | CHEWABLE_TABLET | ORAL | Status: AC
  Administered 2019-08-03: 14:00:00 81 mg via ORAL

## 2019-08-03 MED ORDER — FENTANYL CITRATE (PF) 100 MCG/2ML IJ SOLN
INTRAMUSCULAR | Status: DC | PRN
  Administered 2019-08-03 (×2): 25 ug via INTRAVENOUS

## 2019-08-03 MED ORDER — HEPARIN (PORCINE) IN NACL 1000-0.9 UT/500ML-% IV SOLN
INTRAVENOUS | Status: AC
  Filled 2019-08-03: qty 1000

## 2019-08-03 MED ORDER — SODIUM CHLORIDE 0.9 % WEIGHT BASED INFUSION
3.0000 mL/kg/h | INTRAVENOUS | Status: AC
  Administered 2019-08-03: 3 mL/kg/h via INTRAVENOUS

## 2019-08-03 MED ORDER — MIDAZOLAM HCL 2 MG/2ML IJ SOLN
INTRAMUSCULAR | Status: DC | PRN
  Administered 2019-08-03 (×2): 1 mg via INTRAVENOUS

## 2019-08-03 MED ORDER — IOHEXOL 300 MG/ML  SOLN
INTRAMUSCULAR | Status: DC | PRN
  Administered 2019-08-03: 16:00:00 100 mL

## 2019-08-03 MED ORDER — SODIUM CHLORIDE 0.9 % WEIGHT BASED INFUSION: 1.0000 mL/kg/h | INTRAVENOUS | Status: DC

## 2019-08-03 MED ORDER — HYDRALAZINE HCL 20 MG/ML IJ SOLN
INTRAMUSCULAR | Status: AC
  Filled 2019-08-03: qty 1

## 2019-08-03 MED ORDER — HYDRALAZINE HCL 20 MG/ML IJ SOLN: 10.0000 mg | INTRAMUSCULAR | Status: DC | PRN

## 2019-08-03 MED ORDER — DIPHENHYDRAMINE HCL 50 MG/ML IJ SOLN
INTRAMUSCULAR | Status: DC | PRN
  Administered 2019-08-03: 25 mg via INTRAVENOUS

## 2019-08-03 MED ORDER — LABETALOL HCL 5 MG/ML IV SOLN: 10.0000 mg | INTRAVENOUS | Status: DC | PRN

## 2019-08-03 MED ORDER — ASPIRIN 81 MG PO CHEW: 81.0000 mg | CHEWABLE_TABLET | Freq: Every day | ORAL | Status: DC

## 2019-08-03 MED ORDER — DIPHENHYDRAMINE HCL 50 MG/ML IJ SOLN
INTRAMUSCULAR | Status: AC
  Filled 2019-08-03: qty 1

## 2019-08-03 MED ORDER — ONDANSETRON HCL 4 MG/2ML IJ SOLN: 4.0000 mg | Freq: Four times a day (QID) | INTRAMUSCULAR | Status: DC | PRN

## 2019-08-03 MED ORDER — SODIUM CHLORIDE 0.9% FLUSH: 3.0000 mL | INTRAVENOUS | Status: DC | PRN

## 2019-08-03 MED ORDER — ACETAMINOPHEN 325 MG PO TABS: 650.0000 mg | ORAL_TABLET | ORAL | Status: DC | PRN

## 2019-08-03 SURGICAL SUPPLY — 8 items
CATH INFINITI 5 FR JL3.5 (CATHETERS) ×1 IMPLANT
CATH INFINITI JR4 5F (CATHETERS) ×1 IMPLANT
DEVICE RAD TR BAND REGULAR (VASCULAR PRODUCTS) ×2 IMPLANT
GLIDESHEATH SLEND SS 6F .021 (SHEATH) ×1 IMPLANT
KIT MANI 3VAL PERCEP (MISCELLANEOUS) ×2 IMPLANT
PACK CARDIAC CATH (CUSTOM PROCEDURE TRAY) ×2 IMPLANT
WIRE HITORQ VERSACORE ST 145CM (WIRE) ×1 IMPLANT
WIRE ROSEN-J .035X260CM (WIRE) ×1 IMPLANT

## 2019-08-03 NOTE — Progress Notes (Signed)
TR band off. Site without hematoma, edema, drainage, ecchymosis. Sterile 2x2 and Tegaderm to site. Pt. Assisted with dressing self for home DC. Spoke with pt. Wife again re: new meds., post op care, follow-up with MD. Pt. And spouse verbalize understanding . Dr. Clayborn Bigness phoned floor and states "if pt. Has any problems, they can call me on my cellphone: I gave the number to his wife. "

## 2019-08-03 NOTE — Progress Notes (Signed)
Dr. Clayborn Bigness at bedside, speaking with pt. MD told pt. He would like to try Medical management . Pt. Agreeable. Called pt. Wife and spoke with her re: DC instructions and change in medications. Wife Alveda Reasons understanding and aware to pick up meds. At pt. Pharmacy.

## 2019-08-04 ENCOUNTER — Encounter: Payer: Self-pay | Admitting: Internal Medicine

## 2021-01-03 DIAGNOSIS — R7989 Other specified abnormal findings of blood chemistry: Secondary | ICD-10-CM | POA: Insufficient documentation

## 2021-02-07 ENCOUNTER — Other Ambulatory Visit: Payer: Self-pay | Admitting: Specialist

## 2021-02-07 DIAGNOSIS — R0609 Other forms of dyspnea: Secondary | ICD-10-CM

## 2021-02-07 DIAGNOSIS — R0600 Dyspnea, unspecified: Secondary | ICD-10-CM

## 2021-02-16 ENCOUNTER — Ambulatory Visit: Payer: Medicare Other

## 2021-08-30 ENCOUNTER — Other Ambulatory Visit: Payer: Self-pay | Admitting: Internal Medicine

## 2021-08-31 ENCOUNTER — Other Ambulatory Visit: Payer: Self-pay

## 2021-08-31 ENCOUNTER — Inpatient Hospital Stay
Admission: EM | Admit: 2021-08-31 | Discharge: 2021-09-08 | DRG: 871 | Disposition: A | Discharge status: Home / Self Care | Payer: Medicare Other | Source: Ambulatory Visit | Attending: Internal Medicine | Admitting: Internal Medicine

## 2021-08-31 ENCOUNTER — Inpatient Hospital Stay
Admission: RE | Admit: 2021-08-31 | Discharge: 2021-08-31 | Disposition: A | Discharge status: Home / Self Care | Payer: Medicare Other | Source: Ambulatory Visit | Attending: Internal Medicine | Admitting: Internal Medicine

## 2021-08-31 ENCOUNTER — Other Ambulatory Visit: Payer: Self-pay | Admitting: Internal Medicine

## 2021-08-31 ENCOUNTER — Encounter: Payer: Self-pay | Admitting: Emergency Medicine

## 2021-08-31 ENCOUNTER — Other Ambulatory Visit: Payer: Self-pay | Admitting: Specialist

## 2021-08-31 ENCOUNTER — Emergency Department: Payer: Medicare Other

## 2021-08-31 DIAGNOSIS — R058 Other specified cough: Secondary | ICD-10-CM | POA: Diagnosis present

## 2021-08-31 DIAGNOSIS — A4159 Other Gram-negative sepsis: Secondary | ICD-10-CM | POA: Diagnosis present

## 2021-08-31 DIAGNOSIS — B9689 Other specified bacterial agents as the cause of diseases classified elsewhere: Secondary | ICD-10-CM | POA: Diagnosis present

## 2021-08-31 DIAGNOSIS — I2782 Chronic pulmonary embolism: Secondary | ICD-10-CM | POA: Diagnosis present

## 2021-08-31 DIAGNOSIS — I10 Essential (primary) hypertension: Secondary | ICD-10-CM | POA: Diagnosis not present

## 2021-08-31 DIAGNOSIS — R1084 Generalized abdominal pain: Secondary | ICD-10-CM | POA: Diagnosis not present

## 2021-08-31 DIAGNOSIS — K5720 Diverticulitis of large intestine with perforation and abscess without bleeding: Secondary | ICD-10-CM | POA: Diagnosis not present

## 2021-08-31 DIAGNOSIS — R109 Unspecified abdominal pain: Secondary | ICD-10-CM | POA: Diagnosis present

## 2021-08-31 DIAGNOSIS — L0291 Cutaneous abscess, unspecified: Secondary | ICD-10-CM

## 2021-08-31 DIAGNOSIS — Z881 Allergy status to other antibiotic agents status: Secondary | ICD-10-CM | POA: Diagnosis not present

## 2021-08-31 DIAGNOSIS — E0500 Thyrotoxicosis with diffuse goiter without thyrotoxic crisis or storm: Secondary | ICD-10-CM | POA: Diagnosis present

## 2021-08-31 DIAGNOSIS — Z20822 Contact with and (suspected) exposure to covid-19: Secondary | ICD-10-CM | POA: Diagnosis present

## 2021-08-31 DIAGNOSIS — R7989 Other specified abnormal findings of blood chemistry: Secondary | ICD-10-CM

## 2021-08-31 DIAGNOSIS — Z7901 Long term (current) use of anticoagulants: Secondary | ICD-10-CM

## 2021-08-31 DIAGNOSIS — K5090 Crohn's disease, unspecified, without complications: Secondary | ICD-10-CM | POA: Diagnosis present

## 2021-08-31 DIAGNOSIS — Z7984 Long term (current) use of oral hypoglycemic drugs: Secondary | ICD-10-CM | POA: Diagnosis not present

## 2021-08-31 DIAGNOSIS — Z8249 Family history of ischemic heart disease and other diseases of the circulatory system: Secondary | ICD-10-CM | POA: Diagnosis not present

## 2021-08-31 DIAGNOSIS — Z8673 Personal history of transient ischemic attack (TIA), and cerebral infarction without residual deficits: Secondary | ICD-10-CM | POA: Diagnosis not present

## 2021-08-31 DIAGNOSIS — Z87891 Personal history of nicotine dependence: Secondary | ICD-10-CM

## 2021-08-31 DIAGNOSIS — Z96652 Presence of left artificial knee joint: Secondary | ICD-10-CM | POA: Diagnosis present

## 2021-08-31 DIAGNOSIS — K651 Peritoneal abscess: Secondary | ICD-10-CM | POA: Diagnosis present

## 2021-08-31 DIAGNOSIS — I2510 Atherosclerotic heart disease of native coronary artery without angina pectoris: Secondary | ICD-10-CM | POA: Diagnosis present

## 2021-08-31 DIAGNOSIS — Z7982 Long term (current) use of aspirin: Secondary | ICD-10-CM | POA: Diagnosis not present

## 2021-08-31 DIAGNOSIS — E119 Type 2 diabetes mellitus without complications: Secondary | ICD-10-CM | POA: Diagnosis present

## 2021-08-31 DIAGNOSIS — D72829 Elevated white blood cell count, unspecified: Secondary | ICD-10-CM | POA: Insufficient documentation

## 2021-08-31 DIAGNOSIS — G459 Transient cerebral ischemic attack, unspecified: Secondary | ICD-10-CM | POA: Diagnosis present

## 2021-08-31 DIAGNOSIS — Z1611 Resistance to penicillins: Secondary | ICD-10-CM | POA: Diagnosis present

## 2021-08-31 DIAGNOSIS — Z79899 Other long term (current) drug therapy: Secondary | ICD-10-CM | POA: Diagnosis not present

## 2021-08-31 DIAGNOSIS — U099 Post covid-19 condition, unspecified: Secondary | ICD-10-CM | POA: Diagnosis present

## 2021-08-31 DIAGNOSIS — R1032 Left lower quadrant pain: Secondary | ICD-10-CM | POA: Diagnosis not present

## 2021-08-31 DIAGNOSIS — I2699 Other pulmonary embolism without acute cor pulmonale: Secondary | ICD-10-CM | POA: Diagnosis present

## 2021-08-31 DIAGNOSIS — E039 Hypothyroidism, unspecified: Secondary | ICD-10-CM | POA: Diagnosis present

## 2021-08-31 LAB — URINALYSIS, MICROSCOPIC (REFLEX)

## 2021-08-31 LAB — COMPREHENSIVE METABOLIC PANEL
ALT: 52 U/L — ABNORMAL HIGH (ref 0–44)
AST: 37 U/L (ref 15–41)
Albumin: 3.2 g/dL — ABNORMAL LOW (ref 3.5–5.0)
Alkaline Phosphatase: 106 U/L (ref 38–126)
Anion gap: 10 (ref 5–15)
BUN: 19 mg/dL (ref 8–23)
CO2: 25 mmol/L (ref 22–32)
Calcium: 9.1 mg/dL (ref 8.9–10.3)
Chloride: 97 mmol/L — ABNORMAL LOW (ref 98–111)
Creatinine, Ser: 1.1 mg/dL (ref 0.61–1.24)
GFR, Estimated: >60 mL/min (ref >60)
Glucose, Bld: 111 mg/dL — ABNORMAL HIGH (ref 70–99)
Potassium: 4 mmol/L (ref 3.5–5.1)
Sodium: 132 mmol/L — ABNORMAL LOW (ref 135–145)
Total Bilirubin: 0.8 mg/dL (ref 0.3–1.2)
Total Protein: 8.2 g/dL — ABNORMAL HIGH (ref 6.5–8.1)

## 2021-08-31 LAB — CBC
HCT: 43.9 % (ref 39.0–52.0)
Hemoglobin: 14.7 g/dL (ref 13.0–17.0)
MCH: 30.4 pg (ref 26.0–34.0)
MCHC: 33.5 g/dL (ref 30.0–36.0)
MCV: 90.7 fL (ref 80.0–100.0)
Platelets: 339 10*3/uL (ref 150–400)
RBC: 4.84 MIL/uL (ref 4.22–5.81)
RDW: 13.2 % (ref 11.5–15.5)
WBC: 15.7 10*3/uL — ABNORMAL HIGH (ref 4.0–10.5)
nRBC: 0 % (ref 0.0–0.2)

## 2021-08-31 LAB — URINALYSIS, ROUTINE W REFLEX MICROSCOPIC
Bilirubin Urine: NEGATIVE
Glucose, UA: NEGATIVE mg/dL
Hgb urine dipstick: NEGATIVE
Ketones, ur: NEGATIVE mg/dL
Leukocytes,Ua: NEGATIVE
Nitrite: NEGATIVE
Protein, ur: >300 mg/dL — AB
Specific Gravity, Urine: <1.005 — ABNORMAL LOW (ref 1.005–1.030)
pH: 5 (ref 5.0–8.0)

## 2021-08-31 LAB — LACTIC ACID, PLASMA: Lactic Acid, Venous: 1.3 mmol/L (ref 0.5–1.9)

## 2021-08-31 LAB — LIPASE, BLOOD: Lipase: 44 U/L (ref 11–51)

## 2021-08-31 LAB — APTT: aPTT: 30 seconds (ref 24–36)

## 2021-08-31 LAB — RESP PANEL BY RT-PCR (FLU A&B, COVID) ARPGX2
Influenza A by PCR: NEGATIVE
Influenza B by PCR: NEGATIVE
SARS Coronavirus 2 by RT PCR: NEGATIVE

## 2021-08-31 LAB — PROTIME-INR
INR: 1 (ref 0.8–1.2)
Prothrombin Time: 13.6 seconds (ref 11.4–15.2)

## 2021-08-31 MED ORDER — SODIUM CHLORIDE 0.9 % IV SOLN: INTRAVENOUS | Status: DC

## 2021-08-31 MED ORDER — HEPARIN (PORCINE) 25000 UT/250ML-% IV SOLN
1300.0000 [IU]/h | INTRAVENOUS | Status: DC
  Administered 2021-08-31: 20:00:00 1300 [IU]/h via INTRAVENOUS
  Filled 2021-08-31: qty 250

## 2021-08-31 MED ORDER — ACETAMINOPHEN 325 MG PO TABS
650.0000 mg | ORAL_TABLET | Freq: Four times a day (QID) | ORAL | Status: DC | PRN
  Administered 2021-08-31: 22:00:00 650 mg via ORAL
  Filled 2021-08-31: qty 2

## 2021-08-31 MED ORDER — SODIUM CHLORIDE 0.9% FLUSH
3.0000 mL | Freq: Two times a day (BID) | INTRAVENOUS | Status: DC
  Administered 2021-08-31 – 2021-09-08 (×14): 3 mL via INTRAVENOUS

## 2021-08-31 MED ORDER — ACETAMINOPHEN 650 MG RE SUPP
650.0000 mg | Freq: Four times a day (QID) | RECTAL | Status: DC | PRN
  Filled 2021-08-31: qty 1

## 2021-08-31 MED ORDER — HYDROCHLOROTHIAZIDE 12.5 MG PO TABS
12.5000 mg | ORAL_TABLET | Freq: Every day | ORAL | Status: DC
Start: 2021-09-01 — End: 2021-09-04
  Administered 2021-09-02 – 2021-09-04 (×3): 12.5 mg via ORAL
  Filled 2021-08-31 (×3): qty 1

## 2021-08-31 MED ORDER — PROPYLTHIOURACIL 50 MG PO TABS
25.0000 mg | ORAL_TABLET | Freq: Two times a day (BID) | ORAL | Status: DC
  Administered 2021-08-31 – 2021-09-08 (×14): 25 mg via ORAL
  Filled 2021-08-31 (×18): qty 0.5

## 2021-08-31 MED ORDER — METRONIDAZOLE 500 MG/100ML IV SOLN
500.0000 mg | Freq: Once | INTRAVENOUS | Status: AC
  Administered 2021-08-31: 18:00:00 500 mg via INTRAVENOUS
  Filled 2021-08-31: qty 100

## 2021-08-31 MED ORDER — AMLODIPINE BESYLATE 5 MG PO TABS
5.0000 mg | ORAL_TABLET | Freq: Two times a day (BID) | ORAL | Status: DC
  Administered 2021-08-31 – 2021-09-04 (×7): 5 mg via ORAL
  Filled 2021-08-31 (×7): qty 1

## 2021-08-31 MED ORDER — TELMISARTAN-HCTZ 80-12.5 MG PO TABS: 1.0000 | ORAL_TABLET | Freq: Every day | ORAL | Status: DC

## 2021-08-31 MED ORDER — SODIUM CHLORIDE 0.9 % IV SOLN
1.0000 g | Freq: Once | INTRAVENOUS | Status: AC
  Administered 2021-08-31: 18:00:00 1 g via INTRAVENOUS
  Filled 2021-08-31: qty 10

## 2021-08-31 MED ORDER — SODIUM CHLORIDE 0.9% FLUSH
3.0000 mL | INTRAVENOUS | Status: DC | PRN
  Administered 2021-09-02 – 2021-09-03 (×2): 3 mL via INTRAVENOUS

## 2021-08-31 MED ORDER — PIPERACILLIN-TAZOBACTAM 3.375 G IVPB 30 MIN
3.3750 g | Freq: Four times a day (QID) | INTRAVENOUS | Status: DC
  Administered 2021-08-31 – 2021-09-01 (×2): 3.375 g via INTRAVENOUS
  Filled 2021-08-31 (×2): qty 50

## 2021-08-31 MED ORDER — IRBESARTAN 150 MG PO TABS
300.0000 mg | ORAL_TABLET | Freq: Every day | ORAL | Status: DC
Start: 2021-09-01 — End: 2021-09-04
  Administered 2021-09-02 – 2021-09-04 (×3): 300 mg via ORAL
  Filled 2021-08-31 (×4): qty 2

## 2021-08-31 MED ORDER — ISOSORBIDE MONONITRATE ER 30 MG PO TB24
60.0000 mg | ORAL_TABLET | Freq: Every day | ORAL | Status: DC
  Administered 2021-09-02 – 2021-09-08 (×7): 60 mg via ORAL
  Filled 2021-08-31 (×7): qty 2

## 2021-08-31 MED ORDER — ATORVASTATIN CALCIUM 20 MG PO TABS
80.0000 mg | ORAL_TABLET | Freq: Every day | ORAL | Status: DC
  Administered 2021-08-31 – 2021-09-07 (×8): 80 mg via ORAL
  Filled 2021-08-31 (×8): qty 4

## 2021-08-31 MED ORDER — SODIUM CHLORIDE 0.9 % IV SOLN: Freq: Once | INTRAVENOUS | Status: AC

## 2021-08-31 MED ORDER — HEPARIN BOLUS VIA INFUSION
4900.0000 [IU] | Freq: Once | INTRAVENOUS | Status: AC
  Administered 2021-08-31: 20:00:00 4900 [IU] via INTRAVENOUS
  Filled 2021-08-31: qty 4900

## 2021-08-31 MED ORDER — INSULIN ASPART 100 UNIT/ML IJ SOLN
0.0000 [IU] | Freq: Three times a day (TID) | INTRAMUSCULAR | Status: DC
  Administered 2021-09-02 (×2): 1 [IU] via SUBCUTANEOUS

## 2021-08-31 MED ORDER — SODIUM CHLORIDE 0.9 % IV SOLN: 250.0000 mL | INTRAVENOUS | Status: DC | PRN

## 2021-08-31 MED ORDER — METOPROLOL SUCCINATE ER 25 MG PO TB24
12.5000 mg | ORAL_TABLET | Freq: Every day | ORAL | Status: DC
  Administered 2021-08-31 – 2021-09-07 (×8): 12.5 mg via ORAL
  Filled 2021-08-31: qty 0.5
  Filled 2021-08-31 (×3): qty 1
  Filled 2021-08-31: qty 0.5
  Filled 2021-08-31 (×4): qty 1

## 2021-08-31 MED ORDER — IOHEXOL 300 MG/ML  SOLN
100.0000 mL | Freq: Once | INTRAMUSCULAR | Status: AC | PRN
  Administered 2021-08-31: 17:00:00 100 mL via INTRAVENOUS

## 2021-08-31 NOTE — H&P (Signed)
History and Physical    Todd BABAR Sr. [id number] DOB: Jul 18, 1946 DOA: 08/31/2021  PCP: Tracie Harrier, MD    Patient coming from:  Home   Chief Complaint:  Abdominal pain   HPI:  [employer] Sr. is a 75 y.o. male seen in ed with complaints of abdominal pain that has been going on for nearly 2 weeks.  Patient reports that the pain has constant it is pressure-like.  He has been treated for diverticulitis with antibiotics for the past 2 weeks and has some nausea but otherwise review of systems is negative for headaches blurred vision speech gait chest pain palpitations shortness of breath fevers chills diarrhea hematochezia or any urinary issues.  Daughter is at bedside as well giving history.  Patient states that he feels like he has to have a bowel movement and has abdominal pressure.  Does not note any alleviating or aggravating factors in the past 2 weeks.   Pt has past medical history of diverticulitis, allergies to ciprofloxacin, BPH, gallstones, hypertension, hypothyroidism. ED Course:  Vitals:   08/31/21 1744 08/31/21 1747 08/31/21 1900  BP: 138/85  (!) 146/81  Pulse: 93  91  Resp: 20  18  Temp: 98.4 F (36.9 C)    TempSrc: Oral    SpO2: 92%  96%  Weight:  81.6 kg   Height:  5\' 7"  (1.702 m)   In the emergency room patient is alert aware oriented afebrile cooperative normotensive.  Patient does meet sepsis criteria with heart rate above 90 respirations in the 20s white count at 15 and source of infection.  Oxygenating 92% 96% on room air. Lab work today shows a sodium of 132 chloride 97 glucose 111 normal creatinine albumin of 3.2 ALT of 52 and total protein of 8.2 GFR more than 60. CBC shows a white count of 15.7, hemoglobin of 14.7 and platelet count of 339. Respiratory panel negative for flu and COVID. CT scan of the abdomen and pelvis with contrast shows a large intra-abdominal abscess in the proximal sigmoid colon, multiple extraluminal foci of air also  representing small abscesses bowel wall thickening is noted, filling defect in the right lower lobe pulmonary artery branch with concerns for pulmonary embolism and CTA is recommended.  Nonobstructing right renal stone. Requested for admission by ED physician Dr. Quentin Cornwall Case has been discussed by EDMD with surgeon general surgeon Dr. Christian Mate who will see the patient tomorrow and decide on intervention and patient is okay for heparin drip tonight.  Review of Systems:  Review of Systems  Gastrointestinal:  Positive for abdominal pain, constipation and nausea.  All other systems reviewed and are negative.   Past Medical History:  Diagnosis Date   Diverticulitis    Enlarged prostate    Gallstones    Hypertension    Hyperthyroidism     Past Surgical History:  Procedure Laterality Date   LEFT HEART CATH AND CORONARY ANGIOGRAPHY Left 08/03/2019   Procedure: LEFT HEART CATH AND CORONARY ANGIOGRAPHY;  Surgeon: Yolonda Kida, MD;  Location: [place] [facility];  Service: Cardiovascular;  Laterality: Left;   MEDIAL PARTIAL KNEE REPLACEMENT Left    2018   SHOULDER SURGERY Right 2016     reports that he quit smoking about 53 years ago. He has never used smokeless tobacco. He reports current alcohol use. He reports that he does not use drugs.  Allergies  Allergen Reactions   Ciprofloxacin Rash    Family History  Problem Relation Age of Onset  Heart disease Mother    Heart disease Father     Prior to Admission medications   Medication Sig Start Date End Date Taking? Authorizing Provider  amLODipine (NORVASC) 5 MG tablet Take 5 mg by mouth 2 (two) times daily.   Yes [provider]  amoxicillin-clavulanate (AUGMENTIN) 875-125 MG tablet Take 1 tablet by mouth 2 (two) times daily. 08/25/21 09/04/21 Yes [provider]  aspirin EC 81 MG EC tablet Take 1 tablet (81 mg total) by mouth daily. 12/23/15  Yes Gouru, Illene Silver, MD  atorvastatin (LIPITOR) 80 MG tablet Take  80 mg by mouth daily.   Yes [provider]  chlorpheniramine-HYDROcodone (TUSSIONEX) 10-8 MG/5ML SUER Take 5 mLs by mouth every 12 (twelve) hours as needed for cough.   Yes [provider]  cyanocobalamin 1000 MCG tablet Take 1,000 mcg by mouth daily.   Yes [provider]  isosorbide mononitrate (IMDUR) 60 MG 24 hr tablet Take 60 mg by mouth daily.   Yes [provider]  metFORMIN (GLUCOPHAGE) 500 MG tablet Take 500 mg by mouth daily.   Yes [provider]  metoprolol succinate (TOPROL-XL) 25 MG 24 hr tablet Take 12.5 mg by mouth daily.   Yes [provider]  metroNIDAZOLE (FLAGYL) 500 MG tablet Take 500 mg by mouth 3 (three) times daily. 08/30/21 09/09/21 Yes [provider]  propylthiouracil (PTU) 50 MG tablet Take 25 mg by mouth 2 (two) times daily.   Yes [provider]  telmisartan-hydrochlorothiazide (MICARDIS HCT) 80-12.5 MG tablet Take 1 tablet by mouth daily.   Yes [provider]  Vitamin D, Ergocalciferol, (DRISDOL) 1.25 MG (50000 UNIT) CAPS capsule Take 50,000 Units by mouth once a week.   Yes [provider]    Physical Exam: Vitals:   08/31/21 1744 08/31/21 1747 08/31/21 1900  BP: 138/85  (!) 146/81  Pulse: 93  91  Resp: 20  18  Temp: 98.4 F (36.9 C)    TempSrc: Oral    SpO2: 92%  96%  Weight:  81.6 kg   Height:  5\' 7"  (1.702 m)    Physical Exam Vitals reviewed.  Constitutional:      General: He is not in acute distress.    Appearance: Normal appearance. He is not ill-appearing, toxic-appearing or diaphoretic.  HENT:     Head: Normocephalic and atraumatic.     Right Ear: External ear normal.     Left Ear: External ear normal.     Nose: Nose normal.     Mouth/Throat:     Mouth: Mucous membranes are moist.  Eyes:     Extraocular Movements: Extraocular movements intact.  Neck:     Vascular: No carotid bruit.  Cardiovascular:     Rate and Rhythm: Normal rate and regular  rhythm.     Pulses: Normal pulses.     Heart sounds: Normal heart sounds.  Pulmonary:     Effort: No respiratory distress.     Breath sounds: Normal breath sounds. No stridor.  Abdominal:     General: There is distension.     Palpations: Abdomen is soft. There is no mass.     Tenderness: There is abdominal tenderness. There is no guarding.     Hernia: No hernia is present.  Musculoskeletal:     Right lower leg: No edema.     Left lower leg: No edema.  Skin:    General: Skin is warm.  Neurological:     General: No focal deficit present.  Mental Status: He is alert and oriented to person, place, and time.  Psychiatric:        Mood and Affect: Mood normal.        Behavior: Behavior normal.     Labs on Admission: I have personally reviewed following labs and imaging studies  No results for input(s): CKTOTAL, CKMB, TROPONINI in the last 72 hours. Lab Results  Component Value Date   WBC 15.7 (H) 08/31/2021   HGB 14.7 08/31/2021   HCT 43.9 08/31/2021   MCV 90.7 08/31/2021   PLT 339 08/31/2021    Recent Labs  Lab 08/31/21 1748  NA 132*  K 4.0  CL 97*  CO2 25  BUN 19  CREATININE 1.10  CALCIUM 9.1  PROT 8.2*  BILITOT 0.8  ALKPHOS 106  ALT 52*  AST 37  GLUCOSE 111*   Lab Results  Component Value Date   CHOL 209 (H) 12/23/2015   HDL 40 (L) 12/23/2015   LDLCALC 141 (H) 12/23/2015   TRIG 142 12/23/2015   No results found for: DDIMER Invalid input(s): POCBNP   COVID-19 Labs No results for input(s): DDIMER, FERRITIN, LDH, CRP in the last 72 hours. Lab Results  Component Value Date   [place] NEGATIVE 08/31/2021   [place] NEGATIVE 07/31/2019    Radiological Exams on Admission: CT ABDOMEN PELVIS W CONTRAST  Result Date: 08/31/2021 CLINICAL DATA:  Lower abdominal pain with nausea, initial encounter EXAM: CT ABDOMEN AND PELVIS WITH CONTRAST TECHNIQUE: Multidetector CT imaging of the abdomen and pelvis was performed using the standard protocol following  bolus administration of intravenous contrast. CONTRAST:  124mL OMNIPAQUE IOHEXOL 300 MG/ML  SOLN COMPARISON:  None. FINDINGS: Lower chest: Mild dependent atelectatic changes are noted. Eccentric filling defect is noted within the lower lobe pulmonary arterial branches on the right consistent with pulmonary embolism. Given its somewhat eccentric appearance this may be of a more subacute nature. Hepatobiliary: No focal liver abnormality is seen. No gallstones, gallbladder wall thickening, or biliary dilatation. Pancreas: Unremarkable. No pancreatic ductal dilatation or surrounding inflammatory changes. Spleen: Normal in size without focal abnormality. Adrenals/Urinary Tract: Adrenal glands are within normal limits. Kidneys demonstrate a normal enhancement pattern bilaterally. Lower pole left renal cyst is noted measuring 2.9 cm. Nonobstructing 7 mm stone is noted in the lower pole of the right kidney. Ureters are within normal limits. The bladder is partially distended. Stomach/Bowel: Colon shows diverticular change without evidence of diverticulitis at the junction of the descending and sigmoid colon. A small intramural abscess is noted measuring approximately 19 mm. Adjacent to the sigmoid colon draped over by a multiple inflamed small bowel loops there is a 8.5 x 5.7 cm air-fluid collection consistent with focal abscess. Given the changes in the colon this is likely a diverticular abscess. Multiple smaller collections of air are noted interposed between this abscess and the sigmoid colon. Reactive inflammatory changes in the adjacent small bowel loops are noted. The more proximal colon is within normal limits. The appendix is unremarkable. Small bowel is otherwise within normal limits. The stomach is unremarkable. Vascular/Lymphatic: Aortic atherosclerosis. No enlarged abdominal or pelvic lymph nodes. Reproductive: Prostate is unremarkable. Other: No abdominal wall hernia or abnormality. No abdominopelvic ascites.  Musculoskeletal: Degenerative changes of lumbar spine are noted. IMPRESSION: Changes consistent with large intra-abdominal abscess as described likely related to adjacent diverticulitis. A smaller intramural abscess is noted in the proximal sigmoid colon. Multiple extraluminal foci of air are noted also likely representing small abscesses. Some compensatory small-bowel wall thickening is  noted related to local inflammation. Eccentric filling defect in the right lower lobe pulmonary arterial branch best seen on image number 1 of series 2. This is incompletely evaluated on this exam and likely represents a more subacute pulmonary embolism. CTA of the chest is recommended for further evaluation. Nonobstructing lower pole right renal stone. These results will be called to the ordering clinician or representative by the Radiologist Assistant, and communication documented in the PACS or [employer]. Electronically Signed   By: Alcide Clever M.D.   On: 08/31/2021 17:08   DG Chest Portable 1 View  Result Date: 08/31/2021 CLINICAL DATA:  Hypoxia EXAM: PORTABLE CHEST 1 VIEW COMPARISON:  None FINDINGS: Cardiac shadow is within normal limits. Thoracic aortic calcifications are noted. Lungs are well aerated bilaterally. No focal infiltrate is noted. No bony abnormality is seen. IMPRESSION: No active disease. Electronically Signed   By: Alcide Clever M.D.   On: 08/31/2021 19:03    EKG: Independently reviewed.  Sinus rhythm 84 with an incomplete right bundle branch block.   Assessment/Plan: Principal Problem:   Abdominal pain Active Problems:   Intra-abdominal abscess (HCC)   Pulmonary embolism (HCC)   Type 2 diabetes mellitus (HCC)   TIA (transient ischemic attack)   CAD (coronary artery disease)   Benign essential hypertension Abdominal pain? Intraabdominal abscess: Admit to progressive unit. Cont iv abx and change it zosyn for broader coverage.  General surgery to decide mode of t/t plan. Consult has  been placed and consultant has been contacted.  Npo after midnight.   Pulmoanry embolism: Heparin gtt    DVT prophylaxis:  Heparin  Code Status:  Full code  Family Communication:  Sanjuana Kava (Daughter)  [phone] (Home Phone)   Disposition Plan:  Home  Consults called:  General surgery Dr. Claudine Mouton  Admission status: Inpatient   Gertha Calkin MD [facility] [phone] How to contact the [facility] Attending or Consulting provider 7A - 7P or covering provider during after hours 7P -7A, for this patient.    Check the care team in [facility] and look for a) attending/consulting [facility] provider listed and b) the [facility] team listed Log into [email or web address] and use [place]'s universal password to access. If you do not have the password, please contact the hospital operator. Locate the [facility] provider you are looking for under [facility] and page to a number that you can be directly reached. If you still have difficulty reaching the provider, please page the [facility] (Director on Call) for the Hospitalists listed on amion for assistance. [email or web address] Password [facility] 08/31/2021, 8:08 PM

## 2021-08-31 NOTE — Progress Notes (Signed)
ANTICOAGULATION CONSULT NOTE  Pharmacy Consult for heparin Indication: pulmonary embolus  Allergies  Allergen Reactions   Ciprofloxacin Rash    Patient Measurements: Height: 5\' 7"  (170.2 cm) Weight: 81.6 kg (180 lb) IBW/kg (Calculated) : 66.1 Heparin Dosing Weight: 81 kg  Vital Signs: Temp: 98.4 F (36.9 C) (12/22 1744) Temp Source: Oral (12/22 1744) BP: 146/81 (12/22 1900) Pulse Rate: 91 (12/22 1900)  Labs: Recent Labs    08/31/21 1748  HGB 14.7  HCT 43.9  PLT 339  LABPROT 13.6  INR 1.0  CREATININE 1.10    Estimated Creatinine Clearance: 57.5 mL/min (by C-G formula based on SCr of 1.1 mg/dL).   Medical History: Past Medical History:  Diagnosis Date   Diverticulitis    Enlarged prostate    Gallstones    Hypertension    Hyperthyroidism      Assessment: 75 year old male with abdominal pain sent for possible PE on outpatient CT imaging done 12/22. No anticoagulation noted PTA. Pharmacy consulted for heparin.  Goal of Therapy:  Heparin level 0.3-0.7 units/ml Monitor platelets by anticoagulation protocol: Yes   Plan:  --Heparin 4900 unit bolus --Start heparin infusion at 1300 units/hr --Check HL 12/23 at 0300 --CBC daily while on heparin  1/24, PharmD, BCPS Clinical Pharmacist 08/31/2021 8:00 PM

## 2021-08-31 NOTE — ED Triage Notes (Signed)
Pt reports that he has been having abd pain for the last two weeks, the MDs have been doing blood work and changing his medications. He was at CT and they sent him here to be evaluated because his CT showed a possible PE. Denies any SHOB but does have a cough

## 2021-08-31 NOTE — ED Provider Notes (Signed)
[facility] Emergency Department Provider Note    Event Date/Time   First MD Initiated Contact with Patient 08/31/21 1753     (approximate)  I have reviewed the triage vital signs and the nursing notes.   HISTORY  Chief Complaint Abdominal Pain and abnormal CT Scan    HPI Todd E Boutwell Sr. is a 75 y.o. male with a history of diverticulitis as well as hypertension and hypothyroidism presents to the ER for 2 weeks of worsening lower abdominal pain.  Has been on antibiotics for history of diverticulitis but having worsening symptoms no fevers but has had some chills.  Feeling nauseated having difficulty tolerating p.o.  Had outpatient CT imaging done today that showed evidence of large intra-abdominal abscess and intramural abscess secondary to diverticulitis.  Patient sent to the ER for admission.  Past Medical History:  Diagnosis Date   Diverticulitis    Enlarged prostate    Gallstones    Hypertension    Hyperthyroidism    Family History  Problem Relation Age of Onset   Heart disease Mother    Heart disease Father    Past Surgical History:  Procedure Laterality Date   LEFT HEART CATH AND CORONARY ANGIOGRAPHY Left 08/03/2019   Procedure: LEFT HEART CATH AND CORONARY ANGIOGRAPHY;  Surgeon: Yolonda Kida, MD;  Location: [facility];  Service: Cardiovascular;  Laterality: Left;   MEDIAL PARTIAL KNEE REPLACEMENT Left    2018   SHOULDER SURGERY Right 2016   Patient Active Problem List   Diagnosis Date Noted   CAD (coronary artery disease) 08/31/2021   Type 2 diabetes mellitus ([place]) 08/31/2021   Abdominal pain 08/31/2021   Intra-abdominal abscess ([place]) 08/31/2021   TIA (transient ischemic attack) 12/22/2015   Benign essential hypertension 09/16/2015   Graves' disease 10/06/2014      Prior to Admission medications   Medication Sig Start Date End Date Taking? Authorizing Provider  amLODipine (NORVASC) 5 MG tablet Take 5 mg by  mouth 2 (two) times daily.   Yes [provider]  amoxicillin-clavulanate (AUGMENTIN) 875-125 MG tablet Take 1 tablet by mouth 2 (two) times daily. 08/25/21 09/04/21 Yes [provider]  aspirin EC 81 MG EC tablet Take 1 tablet (81 mg total) by mouth daily. 12/23/15  Yes Gouru, Illene Silver, MD  atorvastatin (LIPITOR) 80 MG tablet Take 80 mg by mouth daily.   Yes [provider]  chlorpheniramine-HYDROcodone (TUSSIONEX) 10-8 MG/5ML SUER Take 5 mLs by mouth every 12 (twelve) hours as needed for cough.   Yes [provider]  cyanocobalamin 1000 MCG tablet Take 1,000 mcg by mouth daily.   Yes [provider]  isosorbide mononitrate (IMDUR) 60 MG 24 hr tablet Take 60 mg by mouth daily.   Yes [provider]  metFORMIN (GLUCOPHAGE) 500 MG tablet Take 500 mg by mouth daily.   Yes [provider]  metoprolol succinate (TOPROL-XL) 25 MG 24 hr tablet Take 12.5 mg by mouth daily.   Yes [provider]  metroNIDAZOLE (FLAGYL) 500 MG tablet Take 500 mg by mouth 3 (three) times daily. 08/30/21 09/09/21 Yes [provider]  propylthiouracil (PTU) 50 MG tablet Take 25 mg by mouth 2 (two) times daily.   Yes [provider]  telmisartan-hydrochlorothiazide (MICARDIS HCT) 80-12.5 MG tablet Take 1 tablet by mouth daily.   Yes [provider]  Vitamin D, Ergocalciferol, (DRISDOL) 1.25 MG (50000 UNIT) CAPS capsule Take 50,000 Units by mouth once a week.   Yes  [provider]    Allergies Ciprofloxacin    Social History Social History   Tobacco Use   Smoking status: Former    Types: Cigarettes    Quit date: 1970    Years since quitting: 53.0   Smokeless tobacco: Never  Substance Use Topics   Alcohol use: Yes    Comment: Social   Drug use: Never    Review of Systems Patient denies headaches, rhinorrhea, blurry vision, numbness, shortness of breath, chest pain, edema, cough, abdominal pain, nausea,  vomiting, diarrhea, dysuria, fevers, rashes or hallucinations unless otherwise stated above in HPI. ____________________________________________   PHYSICAL EXAM:  VITAL SIGNS: Vitals:   08/31/21 1744 08/31/21 1900  BP: 138/85 (!) 146/81  Pulse: 93 91  Resp: 20 18  Temp: 98.4 F (36.9 C)   SpO2: 92% 96%    Constitutional: Alert and oriented.  Eyes: Conjunctivae are normal.  Head: Atraumatic. Nose: No congestion/rhinnorhea. Mouth/Throat: Mucous membranes are moist.   Neck: No stridor. Painless ROM.  Cardiovascular: Normal rate, regular rhythm. Grossly normal heart sounds.  Good peripheral circulation. Respiratory: Normal respiratory effort.  No retractions. Lungs CTAB. Gastrointestinal: Soft with mild lower abdominal ttp, no guarding or rebound. No distention. No abdominal bruits. No CVA tenderness. Genitourinary:  Musculoskeletal: No lower extremity tenderness nor edema.  No joint effusions. Neurologic:  Normal speech and language. No gross focal neurologic deficits are appreciated. No facial droop Skin:  Skin is warm, dry and intact. No rash noted. Psychiatric: Mood and affect are normal. Speech and behavior are normal.  ____________________________________________   LABS (all labs ordered are listed, but only abnormal results are displayed)  Results for orders placed or performed during the hospital encounter of 08/31/21 (from the past 24 hour(s))  Lipase, blood     Status: None   Collection Time: 08/31/21  5:48 PM  Result Value Ref Range   Lipase 44 11 - 51 U/L  Comprehensive metabolic panel     Status: Abnormal   Collection Time: 08/31/21  5:48 PM  Result Value Ref Range   Sodium 132 (L) 135 - 145 mmol/L   Potassium 4.0 3.5 - 5.1 mmol/L   Chloride 97 (L) 98 - 111 mmol/L   CO2 25 22 - 32 mmol/L   Glucose, Bld 111 (H) 70 - 99 mg/dL   BUN 19 8 - 23 mg/dL   Creatinine, Ser 3.23 0.61 - 1.24 mg/dL   Calcium 9.1 8.9 - 55.7 mg/dL   Total Protein 8.2 (H) 6.5 - 8.1 g/dL    Albumin 3.2 (L) 3.5 - 5.0 g/dL   AST 37 15 - 41 U/L   ALT 52 (H) 0 - 44 U/L   Alkaline Phosphatase 106 38 - 126 U/L   Total Bilirubin 0.8 0.3 - 1.2 mg/dL   GFR, Estimated >32 >20 mL/min   Anion gap 10 5 - 15  CBC     Status: Abnormal   Collection Time: 08/31/21  5:48 PM  Result Value Ref Range   WBC 15.7 (H) 4.0 - 10.5 K/uL   RBC 4.84 4.22 - 5.81 MIL/uL   Hemoglobin 14.7 13.0 - 17.0 g/dL   HCT 25.4 27.0 - 62.3 %   MCV 90.7 80.0 - 100.0 fL   MCH 30.4 26.0 - 34.0 pg   MCHC 33.5 30.0 - 36.0 g/dL   RDW 76.2 83.1 - 51.7 %   Platelets 339 150 - 400 K/uL   nRBC 0.0 0.0 - 0.2 %  Protime-INR     Status:  None   Collection Time: 08/31/21  5:48 PM  Result Value Ref Range   Prothrombin Time 13.6 11.4 - 15.2 seconds   INR 1.0 0.8 - 1.2  Resp Panel by RT-PCR (Flu A&B, Covid) Nasopharyngeal Swab     Status: None   Collection Time: 08/31/21  6:10 PM   Specimen: Nasopharyngeal Swab; Nasopharyngeal(NP) swabs in vial transport medium  Result Value Ref Range   SARS Coronavirus 2 by RT PCR NEGATIVE NEGATIVE   Influenza A by PCR NEGATIVE NEGATIVE   Influenza B by PCR NEGATIVE NEGATIVE  Lactic acid, plasma     Status: None   Collection Time: 08/31/21  6:10 PM  Result Value Ref Range   Lactic Acid, Venous 1.3 0.5 - 1.9 mmol/L   ____________________________________________ ____________________________________________  RADIOLOGY  reviewed ____________________________________________   PROCEDURES  Procedure(s) performed:  Procedures    Critical Care performed: no ____________________________________________   INITIAL IMPRESSION / ASSESSMENT AND PLAN / ED COURSE  Pertinent labs & imaging results that were available during my care of the patient were reviewed by me and considered in my medical decision making (see chart for details).   DDX: Abscess, perforation, diverticulitis, colitis, mass  Todd E Harbold Sr. is a 75 y.o. who presents to the ED with presentation as described  above.  CT imaging from outpatient clinic today shows evidence of large intra-abdominal abscess.  Does have leukocytosis but is not febrile and not peritoneal neck.  He denies any chest pain or pressure not sure what to make of the incidental finding of possible PE especially for an on angiogram study.  Will give IV antibiotics.  Clinical Course as of 08/31/21 1953  Thu Aug 31, 2021  1838 Case discussed in consultation with Dr. Christian Mate.  Plans OR in the morning.  Has requested hospitalist admission. [PR]    Clinical Course User Index [PR] Merlyn Lot, MD    The patient was evaluated in Emergency Department today for the symptoms described in the history of present illness. He/she was evaluated in the context of the global COVID-19 pandemic, which necessitated consideration that the patient might be at risk for infection with the SARS-CoV-2 virus that causes COVID-19. Institutional protocols and algorithms that pertain to the evaluation of patients at risk for COVID-19 are in a state of rapid change based on information released by regulatory bodies including the CDC and federal and state organizations. These policies and algorithms were followed during the patient's care in the ED.  As part of my medical decision making, I reviewed the following data within the [facility] notes reviewed and incorporated, Labs reviewed, notes from prior ED visits and [place] Controlled Substance Database   ____________________________________________   FINAL CLINICAL IMPRESSION(S) / ED DIAGNOSES  Final diagnoses:  Diverticulitis of large intestine with abscess without bleeding      NEW MEDICATIONS STARTED DURING THIS VISIT:  New Prescriptions   No medications on file     Note:  This document was prepared using Dragon voice recognition software and may include unintentional dictation errors.    Merlyn Lot, MD 08/31/21 [id number]

## 2021-08-31 NOTE — ED Notes (Signed)
Pt oxygen sats reading 88-89% on RA. Pt placed on 2L [place] at this time. MD aware of oxygen requirement

## 2021-09-01 ENCOUNTER — Encounter: Payer: Self-pay | Admitting: Internal Medicine

## 2021-09-01 ENCOUNTER — Inpatient Hospital Stay: Payer: Medicare Other

## 2021-09-01 DIAGNOSIS — K5720 Diverticulitis of large intestine with perforation and abscess without bleeding: Secondary | ICD-10-CM | POA: Diagnosis not present

## 2021-09-01 DIAGNOSIS — K651 Peritoneal abscess: Secondary | ICD-10-CM | POA: Diagnosis not present

## 2021-09-01 LAB — COMPREHENSIVE METABOLIC PANEL
ALT: 43 U/L (ref 0–44)
AST: 31 U/L (ref 15–41)
Albumin: 2.5 g/dL — ABNORMAL LOW (ref 3.5–5.0)
Alkaline Phosphatase: 84 U/L (ref 38–126)
Anion gap: 7 (ref 5–15)
BUN: 14 mg/dL (ref 8–23)
CO2: 23 mmol/L (ref 22–32)
Calcium: 8.3 mg/dL — ABNORMAL LOW (ref 8.9–10.3)
Chloride: 105 mmol/L (ref 98–111)
Creatinine, Ser: 0.95 mg/dL (ref 0.61–1.24)
GFR, Estimated: >60 mL/min (ref >60)
Glucose, Bld: 114 mg/dL — ABNORMAL HIGH (ref 70–99)
Potassium: 3.8 mmol/L (ref 3.5–5.1)
Sodium: 135 mmol/L (ref 135–145)
Total Bilirubin: 0.9 mg/dL (ref 0.3–1.2)
Total Protein: 6.8 g/dL (ref 6.5–8.1)

## 2021-09-01 LAB — CBC
HCT: 39 % (ref 39.0–52.0)
Hemoglobin: 12.9 g/dL — ABNORMAL LOW (ref 13.0–17.0)
MCH: 29.7 pg (ref 26.0–34.0)
MCHC: 33.1 g/dL (ref 30.0–36.0)
MCV: 89.9 fL (ref 80.0–100.0)
Platelets: 311 10*3/uL (ref 150–400)
RBC: 4.34 MIL/uL (ref 4.22–5.81)
RDW: 13.2 % (ref 11.5–15.5)
WBC: 14.1 10*3/uL — ABNORMAL HIGH (ref 4.0–10.5)
nRBC: 0 % (ref 0.0–0.2)

## 2021-09-01 LAB — CBG MONITORING, ED
Glucose-Capillary: 122 mg/dL — ABNORMAL HIGH (ref 70–99)
Glucose-Capillary: 118 mg/dL — ABNORMAL HIGH (ref 70–99)
Glucose-Capillary: 117 mg/dL — ABNORMAL HIGH (ref 70–99)

## 2021-09-01 LAB — GLUCOSE, CAPILLARY
Glucose-Capillary: 80 mg/dL (ref 70–99)
Glucose-Capillary: 84 mg/dL (ref 70–99)

## 2021-09-01 LAB — HEPARIN LEVEL (UNFRACTIONATED): Heparin Unfractionated: 0.37 IU/mL (ref 0.30–0.70)

## 2021-09-01 MED ORDER — MORPHINE SULFATE (PF) 2 MG/ML IV SOLN: 2.0000 mg | INTRAVENOUS | Status: DC | PRN

## 2021-09-01 MED ORDER — HEPARIN (PORCINE) 25000 UT/250ML-% IV SOLN
1400.0000 [IU]/h | INTRAVENOUS | Status: DC
  Administered 2021-09-01: 22:00:00 1300 [IU]/h via INTRAVENOUS
  Administered 2021-09-03: 22:00:00 1700 [IU]/h via INTRAVENOUS
  Administered 2021-09-03: 08:00:00 1750 [IU]/h via INTRAVENOUS
  Administered 2021-09-04 – 2021-09-05 (×2): 1600 [IU]/h via INTRAVENOUS
  Administered 2021-09-05 – 2021-09-07 (×3): 1400 [IU]/h via INTRAVENOUS
  Filled 2021-09-01 (×9): qty 250

## 2021-09-01 MED ORDER — SODIUM CHLORIDE 0.9% FLUSH
5.0000 mL | Freq: Three times a day (TID) | INTRAVENOUS | Status: DC
  Administered 2021-09-01 – 2021-09-08 (×22): 5 mL

## 2021-09-01 MED ORDER — MIDAZOLAM HCL 2 MG/2ML IJ SOLN
INTRAMUSCULAR | Status: AC
  Filled 2021-09-01: qty 2

## 2021-09-01 MED ORDER — PIPERACILLIN-TAZOBACTAM 3.375 G IVPB
3.3750 g | Freq: Three times a day (TID) | INTRAVENOUS | Status: DC
Start: 2021-09-01 — End: 2021-09-01
  Administered 2021-09-01: 17:00:00 3.375 g via INTRAVENOUS
  Filled 2021-09-01: qty 50

## 2021-09-01 MED ORDER — IOHEXOL 350 MG/ML SOLN: 75.0000 mL | Freq: Once | INTRAVENOUS | Status: DC | PRN

## 2021-09-01 MED ORDER — FENTANYL CITRATE (PF) 100 MCG/2ML IJ SOLN
INTRAMUSCULAR | Status: AC
  Filled 2021-09-01: qty 2

## 2021-09-01 MED ORDER — OXYCODONE HCL 5 MG PO TABS
5.0000 mg | ORAL_TABLET | Freq: Four times a day (QID) | ORAL | Status: DC | PRN
  Administered 2021-09-01 – 2021-09-06 (×5): 5 mg via ORAL
  Filled 2021-09-01 (×6): qty 1

## 2021-09-01 MED ORDER — FENTANYL CITRATE (PF) 100 MCG/2ML IJ SOLN
INTRAMUSCULAR | Status: DC | PRN
  Administered 2021-09-01: 50 ug via INTRAVENOUS

## 2021-09-01 MED ORDER — MIDAZOLAM HCL 5 MG/5ML IJ SOLN
INTRAMUSCULAR | Status: DC | PRN
  Administered 2021-09-01: 1 mg via INTRAVENOUS

## 2021-09-01 MED ORDER — IOHEXOL 350 MG/ML SOLN
75.0000 mL | Freq: Once | INTRAVENOUS | Status: AC | PRN
  Administered 2021-09-01: 08:00:00 75 mL via INTRAVENOUS

## 2021-09-01 MED ORDER — PIPERACILLIN-TAZOBACTAM 3.375 G IVPB
3.3750 g | Freq: Three times a day (TID) | INTRAVENOUS | Status: DC
  Administered 2021-09-02 – 2021-09-04 (×8): 3.375 g via INTRAVENOUS
  Filled 2021-09-01 (×9): qty 50

## 2021-09-01 NOTE — ED Notes (Signed)
Family at bedside requesting update Diannia Ruder, RN notified.

## 2021-09-01 NOTE — ED Notes (Signed)
Pt is asleep at this time. On the monitor   

## 2021-09-01 NOTE — ED Notes (Signed)
MD Fran Lowes notified of holding of all PO medications at this time on this pt d/t NPO status for abscess draining.

## 2021-09-01 NOTE — Progress Notes (Signed)
PROGRESS NOTE    Todd KINZLER Sr.  ZOX:[id number] DOB: [birth year] DOA: 08/31/2021 PCP: Barbette Reichmann, MD  [phone]   Assessment & Plan:   Principal Problem:   Abdominal pain Active Problems:   TIA (transient ischemic attack)   CAD (coronary artery disease)   Benign essential hypertension   Type 2 diabetes mellitus (HCC)   Intra-abdominal abscess (HCC)   Pulmonary embolism (HCC)   Todd E Bordas Sr. is a 75 y.o. male with hx of diverticulitis, BPH, gallstones, hypertension, hyPERthyroidism who presented to ED with complaints of abdominal pain that has been going on for nearly 2 weeks.  Pt had been treated for diverticulitis with antibiotics for the past 2 weeks    # Complicated diverticulitis with abscess S/p drain placement by IR on 12/23 --GenSurg consulted, No emergent surgical intervention at this time. --started on IV zosyn PLAN:  --cont IV zosyn --monitor drain output --f/u abscess cx --clear liquid diet, per GenSurg  # Sepsis, POA --tachycardia and WBC 15.7, source above  # Chronic PE, POA --newly dx during this admission.  ordered heparin gtt on admission, held for IR procedure. --resume anticoagulation after IR drain placement.  # HTN --cont amlodipine, irbesartan, HCTZ, Imdur and Toprol  # Hyperthyroidism --cont home propylthiouracil    DVT prophylaxis: FA:[id number] gtt Code Status: Full code  Family Communication: daughter and family updated at bedside today  Level of care: Med-Surg Dispo:   The patient is from: home Anticipated d/c is to: home Anticipated d/c date is: > 3 days Patient currently is not medically ready to d/c due to: intra-abdominal abscesses, on IV abx, clear liquid diet   Subjective and Interval History:  Pt received 2 drains to his intra-abdominal abscesses.  Total of 120 ml of purulent fluid drained, cx sent.  Pt reported abdominal pain minimum afterwards.     Objective: Vitals:   09/02/21 0816 09/02/21 1253  09/02/21 1725 09/02/21 1727  BP: 138/78 122/70  113/78  Pulse: 72 81 89 88  Resp: Temp: (!) 97.5 F (36.4 C) 97.6 F (36.4 C)  98.4 F (36.9 C)  TempSrc:  Oral  Oral  SpO2: 94% 93% 91% 93%  Weight:      Height:        Intake/Output Summary (Last 24 hours) at 09/02/2021 1912 Last data filed at 09/02/2021 1500 Gross per 24 hour  Intake 363.21 ml  Output 490 ml  Net -126.79 ml   Filed Weights   08/31/21 1747  Weight: 81.6 kg    Examination:   Constitutional: NAD, AAOx3 HEENT: conjunctivae and lids normal, EOMI CV: No cyanosis.   RESP: normal respiratory effort, on RA GI: 2 drains from left side of abdomen draining small amount of bloody fluids Extremities: No effusions, edema in BLE SKIN: warm, dry Neuro: II - XII grossly intact.   Psych: Normal mood and affect.  Appropriate judgement and reason   Data Reviewed: I have personally reviewed following labs and imaging studies  CBC: Recent Labs  Lab 08/31/21 1748 09/01/21 0500 09/02/21 0419  WBC 15.7* 14.1* 11.5*  HGB 14.7 12.9* 13.4  HCT 43.9 39.0 40.6  MCV 90.7 89.9 89.2  PLT 339 311 315   Basic Metabolic Panel: Recent Labs  Lab 08/31/21 1748 09/01/21 0500 09/02/21 0419  NA 132* 135 134*  K 4.0 3.8 3.9  CL 97* 105 105  CO2 GLUCOSE 111* 114* 86  BUN CREATININE 1.10 0.95  0.79  CALCIUM 9.1 8.3* 8.6*  MG  --   --  2.1   GFR: Estimated Creatinine Clearance: 79.1 mL/min (by C-G formula based on SCr of 0.79 mg/dL). Liver Function Tests: Recent Labs  Lab 08/31/21 1748 09/01/21 0500  AST 37 31  ALT 52* 43  ALKPHOS 106 84  BILITOT 0.8 0.9  PROT 8.2* 6.8  ALBUMIN 3.2* 2.5*   Recent Labs  Lab 08/31/21 1748  LIPASE 44   No results for input(s): AMMONIA in the last 168 hours. Coagulation Profile: Recent Labs  Lab 08/31/21 1748  INR 1.0   Cardiac Enzymes: No results for input(s): CKTOTAL, CKMB, CKMBINDEX, TROPONINI in the last 168 hours. BNP (last 3  results) No results for input(s): PROBNP in the last 8760 hours. HbA1C: Recent Labs    08/31/21 1748  HGBA1C 8.5*   CBG: Recent Labs  Lab 09/01/21 1628 09/01/21 2206 09/02/21 0909 09/02/21 1250 09/02/21 1723  GLUCAP 80 84 104* 129* 95   Lipid Profile: No results for input(s): CHOL, HDL, LDLCALC, TRIG, CHOLHDL, LDLDIRECT in the last 72 hours. Thyroid Function Tests: No results for input(s): TSH, T4TOTAL, FREET4, T3FREE, THYROIDAB in the last 72 hours. Anemia Panel: No results for input(s): VITAMINB12, FOLATE, FERRITIN, TIBC, IRON, RETICCTPCT in the last 72 hours. Sepsis Labs: Recent Labs  Lab 08/31/21 1810  LATICACIDVEN 1.3    Recent Results (from the past 240 hour(s))  Resp Panel by RT-PCR (Flu A&B, Covid) Nasopharyngeal Swab     Status: None   Collection Time: 08/31/21  6:10 PM   Specimen: Nasopharyngeal Swab; Nasopharyngeal(NP) swabs in vial transport medium  Result Value Ref Range Status   SARS Coronavirus 2 by RT PCR NEGATIVE NEGATIVE Final    Comment: (NOTE) SARS-CoV-2 target nucleic acids are NOT DETECTED.  The SARS-CoV-2 RNA is generally detectable in upper respiratory specimens during the acute phase of infection. The lowest concentration of SARS-CoV-2 viral copies this assay can detect is 138 copies/mL. A negative result does not preclude SARS-Cov-2 infection and should not be used as the sole basis for treatment or other patient management decisions. A negative result may occur with  improper specimen collection/handling, submission of specimen other than nasopharyngeal swab, presence of viral mutation(s) within the areas targeted by this assay, and inadequate number of viral copies(<138 copies/mL). A negative result must be combined with clinical observations, patient history, and epidemiological information. The expected result is Negative.  Fact Sheet for Patients:  BloggerCourse.com  Fact Sheet for Healthcare Providers:   SeriousBroker.it  This test is no t yet approved or cleared by the [place] FDA and  has been authorized for detection and/or diagnosis of SARS-CoV-2 by FDA under an Emergency Use Authorization (EUA). This EUA will remain  in effect (meaning this test can be used) for the duration of the COVID-19 declaration under Section 564(b)(1) of the Act, 21 U.S.C.section 360bbb-3(b)(1), unless the authorization is terminated  or revoked sooner.       Influenza A by PCR NEGATIVE NEGATIVE Final   Influenza B by PCR NEGATIVE NEGATIVE Final    Comment: (NOTE) The Xpert Xpress SARS-CoV-2/FLU/RSV plus assay is intended as an aid in the diagnosis of influenza from Nasopharyngeal swab specimens and should not be used as a sole basis for treatment. Nasal washings and aspirates are unacceptable for Xpert Xpress SARS-CoV-2/FLU/RSV testing.  Fact Sheet for Patients: BloggerCourse.com  Fact Sheet for Healthcare Providers: SeriousBroker.it  This test is not yet approved or cleared by the [place] and  has been authorized for detection and/or diagnosis of SARS-CoV-2 by FDA under an Emergency Use Authorization (EUA). This EUA will remain in effect (meaning this test can be used) for the duration of the COVID-19 declaration under Section 564(b)(1) of the Act, 21 U.S.C. section 360bbb-3(b)(1), unless the authorization is terminated or revoked.  Performed at [facility], [address] [place], [place] [address]   Aerobic/Anaerobic Culture w Gram Stain (surgical/deep wound)     Status: None (Preliminary result)   Collection Time: 09/01/21  2:10 PM   Specimen: Wound; Abscess  Result Value Ref Range Status   Specimen Description   Final    WOUND Performed at [facility], [address] [place], [place] [address]    Special Requests   Final    [facility] ABSCESS Performed at [facility], [address] [place], [place] [address]    Gram Stain   Final    ABUNDANT WBC PRESENT, PREDOMINANTLY MONONUCLEAR FEW GRAM NEGATIVE RODS    Culture   Final    CULTURE REINCUBATED FOR BETTER GROWTH Performed at [facility], [address], [place], [place] [address]    Report Status PENDING  Incomplete      Radiology Studies: CT Angio Chest Pulmonary Embolism (PE) W or WO Contrast  Result Date: 09/01/2021 CLINICAL DATA:  Possible pulmonary embolus visualized on the CT abdomen/pelvis performed 08/31/2021. EXAM: CT ANGIOGRAPHY CHEST WITH CONTRAST TECHNIQUE: Multidetector CT imaging of the chest was performed using the standard protocol during bolus administration of intravenous contrast. Multiplanar CT image reconstructions and MIPs were obtained to evaluate the vascular anatomy. CONTRAST:  22mL OMNIPAQUE IOHEXOL 350 MG/ML SOLN COMPARISON:  CT abdomen 08/31/2021 FINDINGS: Cardiovascular: Satisfactory opacification of the pulmonary arteries to the segmental level. Tiny subsegmental left lower lobe pulmonary emboli. Mural thrombus in the right lower lobe subsegmental branches likely reflecting chronic pulmonary embolus. Normal heart size. No pericardial effusion. Thoracic aortic atherosclerosis. Mediastinum/Nodes: No enlarged mediastinal, hilar, or axillary lymph nodes. Thyroid gland, trachea, and esophagus demonstrate no significant findings. Lungs/Pleura: Lungs are clear. No pleural effusion or pneumothorax. Bibasilar atelectasis. Upper Abdomen: No acute upper abdominal abnormality. Musculoskeletal: No acute osseous abnormality. No aggressive osseous lesion. Anterior bridging osteophytes involving the mid and lower thoracic spine as can be seen with diffuse idiopathic skeletal hyperostosis. Review of the MIP images confirms the above findings. IMPRESSION: 1. Tiny subsegmental left lower lobe pulmonary emboli. Chronic right lower lobe pulmonary embolus. No right heart strain. 2.   Aortic Atherosclerosis (ICD10-I70.0). Electronically Signed   By: Elige Ko M.D.   On: 09/01/2021 08:45   CT IMAGE GUIDED DRAINAGE BY PERCUTANEOUS CATHETER  Result Date: 09/01/2021 INDICATION: Diverticular abscesses. Please perform CT-guided aspiration and/or drainage catheter placement(s). EXAM: CT IMAGE GUIDED DRAINAGE BY PERCUTANEOUS CATHETER x2 COMPARISON:  CT abdomen pelvis-08/31/2021 MEDICATIONS: The patient is currently admitted to the hospital and receiving intravenous antibiotics. The antibiotics were administered within an appropriate time frame prior to the initiation of the procedure. ANESTHESIA/SEDATION: Moderate (conscious) sedation was employed during this procedure. A total of Versed 1 mg and Fentanyl 50 mcg was administered intravenously. Moderate Sedation Time: 16 minutes. The patient's level of consciousness and vital signs were monitored continuously by radiology nursing throughout the procedure under my direct supervision. CONTRAST:  None COMPLICATIONS: None immediate. PROCEDURE: Informed written consent was obtained from the patient after a discussion of the risks, benefits and alternatives to treatment. The patient was placed supine on the CT gantry and a pre procedural  CT was performed re-demonstrating the known abscess/fluid collection within the left lower abdomen with dominant air and fluid containing collection measuring approximately 8.3 x 4.3 cm (image 48, series 2) and smaller pericolonic collection measuring approximately 3.1 x 2.2 cm (image 51, series 2). The procedure was planned. A timeout was performed prior to the initiation of the procedure. The skin overlying the inferolateral aspect the left abdomen was prepped and draped in the usual sterile fashion. The overlying soft tissues were anesthetized with 1% lidocaine with epinephrine. 18 gauge trocar needles were utilized to target both collections separately, ultimately allowing coiling of short Amplatz wires within both  collections. Appropriate positioning was confirmed with CT imaging. Next, both tracks were serially dilated allowing placement of 10 French all-purpose drainage catheters at both locations. Appropriate position was confirmed with CT imaging (series 9). Next, a total of approximately 120 cc of purulent fluid was aspirated from both collections. A representative sample of aspirated fluid was capped and sent to the laboratory for analysis. Both drainage catheters were connected to gravity bags and secured in place with interrupted sutures. Dressings were applied. The patient tolerated the procedure well without immediate post procedural complication. IMPRESSION: 1. Successful CT-guided placement a 10 French all-purpose drainage catheter into dominant collection within the left lower abdomen/upper pelvis 2. Successful CT-guided placement of a 10 French all-purpose drainage catheter into the smaller pericolonic diverticular abscess within the left lower abdomen 3. A total of approximately 120 cc of purulent fluid was aspirated from both collections. A representative sample of aspirated fluid was capped and sent to the laboratory for analysis. Electronically Signed   By: Simonne Come M.D.   On: 09/01/2021 14:51   CT IMAGE GUIDED DRAINAGE BY PERCUTANEOUS CATHETER  Result Date: 09/01/2021 INDICATION: Diverticular abscesses. Please perform CT-guided aspiration and/or drainage catheter placement(s). EXAM: CT IMAGE GUIDED DRAINAGE BY PERCUTANEOUS CATHETER x2 COMPARISON:  CT abdomen pelvis-08/31/2021 MEDICATIONS: The patient is currently admitted to the hospital and receiving intravenous antibiotics. The antibiotics were administered within an appropriate time frame prior to the initiation of the procedure. ANESTHESIA/SEDATION: Moderate (conscious) sedation was employed during this procedure. A total of Versed 1 mg and Fentanyl 50 mcg was administered intravenously. Moderate Sedation Time: 16 minutes. The patient's level of  consciousness and vital signs were monitored continuously by radiology nursing throughout the procedure under my direct supervision. CONTRAST:  None COMPLICATIONS: None immediate. PROCEDURE: Informed written consent was obtained from the patient after a discussion of the risks, benefits and alternatives to treatment. The patient was placed supine on the CT gantry and a pre procedural CT was performed re-demonstrating the known abscess/fluid collection within the left lower abdomen with dominant air and fluid containing collection measuring approximately 8.3 x 4.3 cm (image 48, series 2) and smaller pericolonic collection measuring approximately 3.1 x 2.2 cm (image 51, series 2). The procedure was planned. A timeout was performed prior to the initiation of the procedure. The skin overlying the inferolateral aspect the left abdomen was prepped and draped in the usual sterile fashion. The overlying soft tissues were anesthetized with 1% lidocaine with epinephrine. 18 gauge trocar needles were utilized to target both collections separately, ultimately allowing coiling of short Amplatz wires within both collections. Appropriate positioning was confirmed with CT imaging. Next, both tracks were serially dilated allowing placement of 10 French all-purpose drainage catheters at both locations. Appropriate position was confirmed with CT imaging (series 9). Next, a total of approximately 120 cc of purulent fluid was aspirated from both collections.  A representative sample of aspirated fluid was capped and sent to the laboratory for analysis. Both drainage catheters were connected to gravity bags and secured in place with interrupted sutures. Dressings were applied. The patient tolerated the procedure well without immediate post procedural complication. IMPRESSION: 1. Successful CT-guided placement a 10 French all-purpose drainage catheter into dominant collection within the left lower abdomen/upper pelvis 2. Successful  CT-guided placement of a 10 French all-purpose drainage catheter into the smaller pericolonic diverticular abscess within the left lower abdomen 3. A total of approximately 120 cc of purulent fluid was aspirated from both collections. A representative sample of aspirated fluid was capped and sent to the laboratory for analysis. Electronically Signed   By: Simonne Come M.D.   On: 09/01/2021 14:51     Scheduled Meds:  amLODipine  5 mg Oral BID   atorvastatin  80 mg Oral QHS   irbesartan  300 mg Oral Daily   And   hydrochlorothiazide  12.5 mg Oral Daily   insulin aspart  0-9 Units Subcutaneous TID WC   isosorbide mononitrate  60 mg Oral Daily   metoprolol succinate  12.5 mg Oral Daily   propylthiouracil  25 mg Oral BID   sodium chloride flush  3 mL Intravenous Q12H   sodium chloride flush  5 mL Intracatheter Q8H   Continuous Infusions:  heparin 1,750 Units/hr (09/02/21 1729)   piperacillin-tazobactam (ZOSYN)  IV 3.375 g (09/02/21 1736)     LOS: 2 days     Darlin Priestly, MD [facility] If 7PM-7AM, please contact night-coverage 09/02/2021, 7:12 PM

## 2021-09-01 NOTE — Procedures (Signed)
Pre procedural Dx: Diverticular abscesses Post procedural Dx: Same  Technically successful CT guided placed of a 10 Fr drainage catheter placement into the dominant abscess within the Hansen lower abdomen and smaller pericolonic abscess also within the Hansen lower abdomen yielding a total of 120 cc of purulent fluid.   A representative aspirated sample was capped and sent to the laboratory for analysis.   Both drains connected to gravity bags.   EBL: Trace Complications: None immediate  Todd Right, MD Pager #: [phone]

## 2021-09-01 NOTE — Progress Notes (Signed)
PHARMACY NOTE:  ANTIMICROBIAL RENAL DOSAGE ADJUSTMENT  Current antimicrobial regimen includes a mismatch between antimicrobial dosage and estimated renal function.  As per policy approved by the Pharmacy & Therapeutics and Medical Executive Committees, the antimicrobial dosage will be adjusted accordingly.  Current antimicrobial dosage:  Zosyn 3.375 g IV q6h  Indication: intra-abominal infection  Renal Function: Estimated Creatinine Clearance: 66.6 mL/min (by C-G formula based on SCr of 0.95 mg/dL).    Antimicrobial dosage has been changed to:  Zosyn 3.375 g IV q8h EI   Thank you for allowing pharmacy to be a part of this patient's care.  Robyne Peers Alberta Cairns, [facility] 09/01/2021 10:29 AM

## 2021-09-01 NOTE — TOC CM/SW Note (Signed)
°  Transition of Care (TOC) Screening Note   Patient Details  Name: Todd JANIS Sr. Date of Birth: 28-Aug-1946   Transition of Care [facility]) CM/SW Contact:    Liliana Cline, LCSW Phone Number: 09/01/2021, 5:09 PM    Transition of Care Department [facility]) has reviewed patient and no TOC needs have been identified at this time. We will continue to monitor patient advancement through interdisciplinary progression rounds. If new patient transition needs arise, please place a TOC consult.

## 2021-09-01 NOTE — Consult Note (Signed)
Chief Complaint: Patient was seen in consultation today for  Chief Complaint  Patient presents with   Abdominal Pain   abnormal CT Scan   at the request of  Darlin Priestly, MD    Referring Physician(s): Darlin Priestly, MD    Supervising Physician: Simonne Come  Patient Status: [facility] - ED  History of Present Illness: Todd Sickle Sr. is a 75 y.o. male with complaints of lower abdominal x 2 weeks, history of diverticulitis who was evaluated by his PCP and treated with antibiotics, CT imaging done yesterday revealed intra-abdominal abscess likely related to diverticulitis. Surgery has seen the patient and requested IR image guided abdominal abscess drainage catheter placement.   The patient denies any current chest pain or shortness of breath. He does admit to ongoing left sided abdominal pain. He denies any nausea or vomiting today. He denies any current blood thinner use, denies any known bleeding or clotting disorder. The patient denies any history of sleep apnea or chronic oxygen use. He has no known complications to sedation.    Past Medical History:  Diagnosis Date   Diverticulitis    Enlarged prostate    Gallstones    Hypertension    Hyperthyroidism     Past Surgical History:  Procedure Laterality Date   LEFT HEART CATH AND CORONARY ANGIOGRAPHY Left 08/03/2019   Procedure: LEFT HEART CATH AND CORONARY ANGIOGRAPHY;  Surgeon: Alwyn Pea, MD;  Location: [facility] INVASIVE CV LAB;  Service: Cardiovascular;  Laterality: Left;   MEDIAL PARTIAL KNEE REPLACEMENT Left    2018   SHOULDER SURGERY Right 2016    Allergies: Ciprofloxacin  Medications: Prior to Admission medications   Medication Sig Start Date End Date Taking? Authorizing Provider  amLODipine (NORVASC) 5 MG tablet Take 5 mg by mouth 2 (two) times daily.   Yes [provider]  amoxicillin-clavulanate (AUGMENTIN) 875-125 MG tablet Take 1 tablet by mouth 2 (two) times daily. 08/25/21 09/04/21 Yes  [provider]  aspirin EC 81 MG EC tablet Take 1 tablet (81 mg total) by mouth daily. 12/23/15  Yes Gouru, Deanna Lukis, MD  atorvastatin (LIPITOR) 80 MG tablet Take 80 mg by mouth daily.   Yes [provider]  chlorpheniramine-HYDROcodone (TUSSIONEX) 10-8 MG/5ML SUER Take 5 mLs by mouth every 12 (twelve) hours as needed for cough.   Yes [provider]  cyanocobalamin 1000 MCG tablet Take 1,000 mcg by mouth daily.   Yes [provider]  isosorbide mononitrate (IMDUR) 60 MG 24 hr tablet Take 60 mg by mouth daily.   Yes [provider]  metFORMIN (GLUCOPHAGE) 500 MG tablet Take 500 mg by mouth daily.   Yes [provider]  metoprolol succinate (TOPROL-XL) 25 MG 24 hr tablet Take 12.5 mg by mouth daily.   Yes [provider]  metroNIDAZOLE (FLAGYL) 500 MG tablet Take 500 mg by mouth 3 (three) times daily. 08/30/21 09/09/21 Yes [provider]  propylthiouracil (PTU) 50 MG tablet Take 25 mg by mouth 2 (two) times daily.   Yes [provider]  telmisartan-hydrochlorothiazide (MICARDIS HCT) 80-12.5 MG tablet Take 1 tablet by mouth daily.   Yes [provider]  Vitamin D, Ergocalciferol, (DRISDOL) 1.25 MG (50000 UNIT) CAPS capsule Take 50,000 Units by mouth once a week.   Yes [provider]     Family History  Problem Relation Age of Onset   Heart disease Mother    Heart disease Father     Social History   Socioeconomic History  Marital status: Married    Spouse name: Jola Babinski    Number of children: 2   Years of education: Not on file   Highest education level: 12th grade  Occupational History   Not on file  Tobacco Use   Smoking status: Former    Types: Cigarettes    Quit date: 1970    Years since quitting: 53.0   Smokeless tobacco: Never  Substance and Sexual Activity   Alcohol use: Yes    Comment: Social   Drug use: Never   Sexual activity: Not on file  Other Topics Concern   Not on  file  Social History Narrative   Not on file   Social Determinants of Health   Financial Resource Strain: Not on file  Food Insecurity: Not on file  Transportation Needs: Not on file  Physical Activity: Not on file  Stress: Not on file  Social Connections: Not on file    Review of Systems: A 12 point ROS discussed and pertinent positives are indicated in the HPI above.  All other systems are negative.  Review of Systems  Vital Signs: BP 134/88 (BP Location: Right Arm)    Pulse 77    Temp 98 F (36.7 C)    Resp (!) 23    Ht 5\' 7"  (1.702 m)    Wt 180 lb (81.6 kg)    SpO2 95%    BMI 28.19 kg/m   Physical Exam Constitutional:      Appearance: He is well-developed.  HENT:     Head: Normocephalic and atraumatic.  Cardiovascular:     Rate and Rhythm: Normal rate and regular rhythm.  Pulmonary:     Effort: Pulmonary effort is normal. No respiratory distress.  Abdominal:     Tenderness: There is abdominal tenderness.  Neurological:     Mental Status: He is alert and oriented to person, place, and time.    Imaging: CT Angio Chest Pulmonary Embolism (PE) W or WO Contrast  Result Date: 09/01/2021 CLINICAL DATA:  Possible pulmonary embolus visualized on the CT abdomen/pelvis performed 08/31/2021. EXAM: CT ANGIOGRAPHY CHEST WITH CONTRAST TECHNIQUE: Multidetector CT imaging of the chest was performed using the standard protocol during bolus administration of intravenous contrast. Multiplanar CT image reconstructions and MIPs were obtained to evaluate the vascular anatomy. CONTRAST:  76mL OMNIPAQUE IOHEXOL 350 MG/ML SOLN COMPARISON:  CT abdomen 08/31/2021 FINDINGS: Cardiovascular: Satisfactory opacification of the pulmonary arteries to the segmental level. Tiny subsegmental left lower lobe pulmonary emboli. Mural thrombus in the right lower lobe subsegmental branches likely reflecting chronic pulmonary embolus. Normal heart size. No pericardial effusion. Thoracic aortic atherosclerosis.  Mediastinum/Nodes: No enlarged mediastinal, hilar, or axillary lymph nodes. Thyroid gland, trachea, and esophagus demonstrate no significant findings. Lungs/Pleura: Lungs are clear. No pleural effusion or pneumothorax. Bibasilar atelectasis. Upper Abdomen: No acute upper abdominal abnormality. Musculoskeletal: No acute osseous abnormality. No aggressive osseous lesion. Anterior bridging osteophytes involving the mid and lower thoracic spine as can be seen with diffuse idiopathic skeletal hyperostosis. Review of the MIP images confirms the above findings. IMPRESSION: 1. Tiny subsegmental left lower lobe pulmonary emboli. Chronic right lower lobe pulmonary embolus. No right heart strain. 2.  Aortic Atherosclerosis (ICD10-I70.0). Electronically Signed   By: 09/02/2021 M.D.   On: 09/01/2021 08:45   CT ABDOMEN PELVIS W CONTRAST  Result Date: 08/31/2021 CLINICAL DATA:  Lower abdominal pain with nausea, initial encounter EXAM: CT ABDOMEN AND PELVIS WITH CONTRAST TECHNIQUE: Multidetector CT imaging of the abdomen and pelvis was performed  using the standard protocol following bolus administration of intravenous contrast. CONTRAST:  OMNIPAQUE IOHEXOL 300 MG/ML  SOLN COMPARISON:  None. FINDINGS: Lower chest: Mild dependent atelectatic changes are noted. Eccentric filling defect is noted within the lower lobe pulmonary arterial branches on the right consistent with pulmonary embolism. Given its somewhat eccentric appearance this may be of a more subacute nature. Hepatobiliary: No focal liver abnormality is seen. No gallstones, gallbladder wall thickening, or biliary dilatation. Pancreas: Unremarkable. No pancreatic ductal dilatation or surrounding inflammatory changes. Spleen: Normal in size without focal abnormality. Adrenals/Urinary Tract: Adrenal glands are within normal limits. Kidneys demonstrate a normal enhancement pattern bilaterally. Lower pole left renal cyst is noted measuring 2.9 cm. Nonobstructing 7 mm  stone is noted in the lower pole of the right kidney. Ureters are within normal limits. The bladder is partially distended. Stomach/Bowel: Colon shows diverticular change without evidence of diverticulitis at the junction of the descending and sigmoid colon. A small intramural abscess is noted measuring approximately 19 mm. Adjacent to the sigmoid colon draped over by a multiple inflamed small bowel loops there is a 8.5 x 5.7 cm air-fluid collection consistent with focal abscess. Given the changes in the colon this is likely a diverticular abscess. Multiple smaller collections of air are noted interposed between this abscess and the sigmoid colon. Reactive inflammatory changes in the adjacent small bowel loops are noted. The more proximal colon is within normal limits. The appendix is unremarkable. Small bowel is otherwise within normal limits. The stomach is unremarkable. Vascular/Lymphatic: Aortic atherosclerosis. No enlarged abdominal or pelvic lymph nodes. Reproductive: Prostate is unremarkable. Other: No abdominal wall hernia or abnormality. No abdominopelvic ascites. Musculoskeletal: Degenerative changes of lumbar spine are noted. IMPRESSION: Changes consistent with large intra-abdominal abscess as described likely related to adjacent diverticulitis. A smaller intramural abscess is noted in the proximal sigmoid colon. Multiple extraluminal foci of air are noted also likely representing small abscesses. Some compensatory small-bowel wall thickening is noted related to local inflammation. Eccentric filling defect in the right lower lobe pulmonary arterial branch best seen on image number 1 of series 2. This is incompletely evaluated on this exam and likely represents a more subacute pulmonary embolism. CTA of the chest is recommended for further evaluation. Nonobstructing lower pole right renal stone. These results will be called to the ordering clinician or representative by the Radiologist Assistant, and  communication documented in the PACS or [employer]. Electronically Signed   By: Alcide Clever M.D.   On: 08/31/2021 17:08   DG Chest Portable 1 View  Result Date: 08/31/2021 CLINICAL DATA:  Hypoxia EXAM: PORTABLE CHEST 1 VIEW COMPARISON:  None FINDINGS: Cardiac shadow is within normal limits. Thoracic aortic calcifications are noted. Lungs are well aerated bilaterally. No focal infiltrate is noted. No bony abnormality is seen. IMPRESSION: No active disease. Electronically Signed   By: Alcide Clever M.D.   On: 08/31/2021 19:03    Labs:  CBC: Recent Labs    08/31/21 1748 09/01/21 0500  WBC 15.7* 14.1*  HGB 14.7 12.9*  HCT 43.9 39.0  PLT 339 311    COAGS: Recent Labs    08/31/21 1748  INR 1.0  APTT 30    BMP: Recent Labs    08/31/21 1748 09/01/21 0500  NA 132* 135  K 4.0 3.8  CL 97* 105  CO2 25 23  GLUCOSE 111* 114*  BUN 19 14  CALCIUM 9.1 8.3*  CREATININE 1.10 0.95  GFRNONAA >60 >60    LIVER FUNCTION  TESTS: Recent Labs    08/31/21 1748 09/01/21 0500  BILITOT 0.8 0.9  AST 37 31  ALT 52* 43  ALKPHOS 106 84  PROT 8.2* 6.8  ALBUMIN 3.2* 2.5*    Assessment and Plan: 75 year old male with complaints of lower abdominal x 2 weeks, history of diverticulitis who was evaluated by his PCP and treated with antibiotics, CT imaging done yesterday revealed intra-abdominal abscess likely related to diverticulitis. Surgery has seen the patient and requested IR image guided abdominal abscess drainage catheter placement.   The patient has been NPO, no blood thinners taken- IV heparin infusion has been stopped since 0600, imaging, labs and vitals have been reviewed.  Risks and benefits discussed with the patient including bleeding, infection, damage to adjacent structures, bowel perforation/fistula connection, and sepsis.  All of the patient's questions were answered, patient is agreeable to proceed. Consent signed and in chart.  Thank you for this interesting consult.   I greatly enjoyed meeting Tabius E Lagace Sr. and look forward to participating in their care.  A copy of this report was sent to the requesting provider on this date.  Electronically Signed: Berneta Levins, PA-C 09/01/2021, 1:20 PM   I spent a total of 20 Minutes in face to face in clinical consultation, greater than 50% of which was counseling/coordinating care for abdominal abscess.

## 2021-09-01 NOTE — ED Notes (Signed)
CBG 117 

## 2021-09-01 NOTE — Consult Note (Signed)
[facility] SURGICAL CONSULTATION NOTE (initial) - [id number]   HISTORY OF PRESENT ILLNESS (HPI):  75 y.o. male presented to [facility] ED yesterday for evaluation of abdominal pain. Patient reports he has had a two week history of lower abdominal pain and associated nausea. He denied any fever, chills, cough, SOB, CP, emesis, diarrhea, blood in his stool, or urinary changes. He has been following with his PCP for this. He was initially started on Augmentin without any relief. He was subsequently switched to Flagyl and a CT was ordered as an outpatient, which he completed yesterday (12/22(). Unfortunately, this was cornering for sigmoid diverticulitis with large abscess as well as possible PE. As such, he was referred to the ED for evaluation. He does report a history of uncomplicated diverticulitis in 2017, which was managed with Abx alone. No episodes of recurrence since. His last colonoscopy was 7 years ago, and he reports "it was so good, they told me I did not need another one for 10 years." Work up in the ED revealed a leukocytosis to 15.7K, sCr - 1.10, mild hyponatremia to 132, and a normal lactic acid level at 1.3. He was admitted to the medicine service and started on heparin gtt and Zosyn. He is pending IR evaluation for abscess drainage and CTA to better evaluate for PE.  Surgery is consulted by emergency medicine physician Dr. Merlyn Lot, MD in this context for evaluation and management of diverticulitis with abscess.  PAST MEDICAL HISTORY (PMH):  Past Medical History:  Diagnosis Date   Diverticulitis    Enlarged prostate    Gallstones    Hypertension    Hyperthyroidism      PAST SURGICAL HISTORY ([place]):  Past Surgical History:  Procedure Laterality Date   LEFT HEART CATH AND CORONARY ANGIOGRAPHY Left 08/03/2019   Procedure: LEFT HEART CATH AND CORONARY ANGIOGRAPHY;  Surgeon: Yolonda Kida, MD;  Location: [facility] CV [facility];  Service: Cardiovascular;   Laterality: Left;   MEDIAL PARTIAL KNEE REPLACEMENT Left    2018   SHOULDER SURGERY Right 2016     MEDICATIONS:  Prior to Admission medications   Medication Sig Start Date End Date Taking? Authorizing Provider  amLODipine (NORVASC) 5 MG tablet Take 5 mg by mouth 2 (two) times daily.   Yes [provider]  amoxicillin-clavulanate (AUGMENTIN) 875-125 MG tablet Take 1 tablet by mouth 2 (two) times daily. 08/25/21 09/04/21 Yes [provider]  aspirin EC 81 MG EC tablet Take 1 tablet (81 mg total) by mouth daily. 12/23/15  Yes Gouru, Illene Silver, MD  atorvastatin (LIPITOR) 80 MG tablet Take 80 mg by mouth daily.   Yes [provider]  chlorpheniramine-HYDROcodone (TUSSIONEX) 10-8 MG/5ML SUER Take 5 mLs by mouth every 12 (twelve) hours as needed for cough.   Yes [provider]  cyanocobalamin 1000 MCG tablet Take 1,000 mcg by mouth daily.   Yes [provider]  isosorbide mononitrate (IMDUR) 60 MG 24 hr tablet Take 60 mg by mouth daily.   Yes [provider]  metFORMIN (GLUCOPHAGE) 500 MG tablet Take 500 mg by mouth daily.   Yes [provider]  metoprolol succinate (TOPROL-XL) 25 MG 24 hr tablet Take 12.5 mg by mouth daily.   Yes [provider]  metroNIDAZOLE (FLAGYL) 500 MG tablet Take 500 mg by mouth 3 (three) times daily. 08/30/21 09/09/21 Yes [provider]  propylthiouracil (PTU) 50 MG tablet Take 25 mg by mouth 2 (two) times daily.   Yes [provider]  telmisartan-hydrochlorothiazide (MICARDIS HCT) 80-12.5 MG tablet Take 1 tablet by mouth daily.   Yes [provider]  Vitamin D, Ergocalciferol, (DRISDOL) 1.25 MG (50000 UNIT) CAPS capsule Take 50,000 Units by mouth once a week.   Yes [provider]     ALLERGIES:  Allergies  Allergen Reactions   Ciprofloxacin Rash     SOCIAL HISTORY:  Social History   Socioeconomic History   Marital status: Married    Spouse name: Jola Babinski     Number of children: 2   Years of education: Not on file   Highest education level: 12th grade  Occupational History   Not on file  Tobacco Use   Smoking status: Former    Types: Cigarettes    Quit date: 1970    Years since quitting: 53.0   Smokeless tobacco: Never  Substance and Sexual Activity   Alcohol use: Yes    Comment: Social   Drug use: Never   Sexual activity: Not on file  Other Topics Concern   Not on file  Social History Narrative   Not on file   Social Determinants of Health   Financial Resource Strain: Not on file  Food Insecurity: Not on file  Transportation Needs: Not on file  Physical Activity: Not on file  Stress: Not on file  Social Connections: Not on file  Intimate Partner Violence: Not on file     FAMILY HISTORY:  Family History  Problem Relation Age of Onset   Heart disease Mother    Heart disease Father       REVIEW OF SYSTEMS:  Review of Systems  Constitutional:  Negative for chills and fever.  HENT:  Negative for congestion and sore throat.   Respiratory:  Negative for cough and shortness of breath.   Cardiovascular:  Negative for chest pain and palpitations.  Gastrointestinal:  Positive for abdominal pain and nausea. Negative for blood in stool, constipation, diarrhea and vomiting.  All other systems reviewed and are negative.  VITAL SIGNS:  Temp:  [97.9 F (36.6 C)-98.4 F (36.9 C)] 98 F (36.7 C) (12/23 0616) Pulse Rate:  [71-93] 75 (12/23 0616) Resp:  [18-27] 25 (12/23 0616) BP: (106-146)/(59-85) 106/59 (12/23 0616) SpO2:  [91 %-99 %] 99 % (12/23 0653) Weight:  [81.6 kg] 81.6 kg (12/22 1747)     Height: 5\' 7"  (170.2 cm) Weight: 81.6 kg BMI (Calculated): 28.19   INTAKE/OUTPUT:  12/22 0701 - 12/23 0700 In: 519.2 [I.V.:276.4; IV Piggyback:242.8] Out: -   PHYSICAL EXAM:  Physical Exam Vitals and nursing note reviewed. Exam conducted with a chaperone present.  Constitutional:      General: He is not in acute distress.     Appearance: He is well-developed. He is not ill-appearing.     Comments: Patient resting comfortably, NAD, daughter at bedside  HENT:     Head: Normocephalic and atraumatic.  Eyes:     General: No scleral icterus.    Extraocular Movements: Extraocular movements intact.  Cardiovascular:     Rate and Rhythm: Normal rate.     Heart sounds: Normal heart sounds. No murmur heard. Pulmonary:     Effort: Pulmonary effort is normal. No respiratory distress.  Abdominal:     General: There is distension (Mild).     Palpations: Abdomen is soft.     Tenderness: There is abdominal tenderness in the left lower quadrant. There is no guarding or rebound.     Hernia: A hernia is present. Hernia is present in  the umbilical area.     Comments: Abdomen is soft, mildly distended, he is tender LLQ, no rebound/guarding. He is certainly not peritonitic   Genitourinary:    Comments: Deferred Musculoskeletal:     Right lower leg: No edema.     Left lower leg: No edema.  Skin:    General: Skin is warm and dry.     Coloration: Skin is not jaundiced or pale.  Neurological:     General: No focal deficit present.     Mental Status: He is alert and oriented to person, place, and time.  Psychiatric:        Mood and Affect: Mood normal.        Behavior: Behavior normal.     Labs:  CBC Latest Ref Rng & Units 09/01/2021 08/31/2021 12/22/2015  WBC 4.0 - 10.5 K/uL 14.1(H) 15.7(H) 9.4  Hemoglobin 13.0 - 17.0 g/dL 12.9(L) 14.7 16.0  Hematocrit 39.0 - 52.0 % 39.0 43.9 46.9  Platelets 150 - 400 K/uL 311 339 218   CMP Latest Ref Rng & Units 09/01/2021 08/31/2021 12/22/2015  Glucose 70 - 99 mg/dL 114(H) 111(H) 95  BUN 8 - 23 mg/dL 14 19 25(H)  Creatinine 0.61 - 1.24 mg/dL 0.95 1.10 0.99  Sodium 135 - 145 mmol/L 135 132(L) 137  Potassium 3.5 - 5.1 mmol/L 3.8 4.0 4.1  Chloride 98 - 111 mmol/L 105 97(L) 110  CO2 22 - 32 mmol/L 23 25 22   Calcium 8.9 - 10.3 mg/dL 8.3(L) 9.1 9.2  Total Protein 6.5 - 8.1 g/dL 6.8  8.2(H) 7.9  Total Bilirubin 0.3 - 1.2 mg/dL 0.9 0.8 0.7  Alkaline Phos 38 - 126 U/L 84 106 49  AST 15 - 41 U/L 31 37 26  ALT 0 - 44 U/L 43 52(H) 32     Imaging studies:   CT Abdomen/Pelvis (08/31/2021) personally reviewed which shows sigmoid diverticulitis with large abscess, no pneumoperitoneum, and radiologist report reviewed below:  IMPRESSION: Changes consistent with large intra-abdominal abscess as described likely related to adjacent diverticulitis. A smaller intramural abscess is noted in the proximal sigmoid colon. Multiple extraluminal foci of air are noted also likely representing small abscesses. Some compensatory small-bowel wall thickening is noted related to local inflammation.   Eccentric filling defect in the right lower lobe pulmonary arterial branch best seen on image number 1 of series 2. This is incompletely evaluated on this exam and likely represents a more subacute pulmonary embolism. CTA of the chest is recommended for further evaluation.   Nonobstructing lower pole right renal stone.    Assessment/Plan: (ICD-10's: K72.92) 75 y.o. male with 2 week history of abdominal pain found to have complicated diverticulitis with abscess, complicated by possible PE.   - Appreciate medicine admission - Reviewed with IR this morning who is aware of patient; plan for drainage today   - NPO for planned procedure - Continue IVF resuscitation - Continue IV Abx (Zosyn) - Monitor abdominal examination - Pain control prn; antiemetics prn - No emergent surgical intervention at this time. Patient and his family understand that our goal is to manage this conservatively. Should he fail to improve or clinically deteriorate, they understand we would need to proceed more urgently and that this would likely result in temporizing colostomy.    - HOLD heparin drip this morning for IR procedure  - Mobilization as tolerated  - Further management per primary service; we will follow    All of the above findings and recommendations were discussed with the patient  and his family, and all of their questions were answered to their expressed satisfaction.  Thank you for the opportunity to participate in this patient's care.   -- Edison Simon, PA-C [facility] 09/01/2021, 7:39 AM [phone] M-F: 7am - 4pm

## 2021-09-01 NOTE — ED Notes (Signed)
This RN called this pt's wife and informed her of pt's room assignment.

## 2021-09-01 NOTE — ED Notes (Signed)
NP Daryl Eastern regarding pts increased oxygen requirements.

## 2021-09-01 NOTE — ED Notes (Signed)
BGL 118

## 2021-09-01 NOTE — ED Notes (Signed)
Family updated that pt still cannot eat and another procedure got pushed in front of pt's anticipated 1100 procedure and this RN is unsure of when pt will go.

## 2021-09-01 NOTE — ED Notes (Signed)
Pt and family updated that procedure had to be moved back from 0930 to 1100.

## 2021-09-01 NOTE — ED Notes (Signed)
Kara RN aware of assigned bed 

## 2021-09-01 NOTE — Progress Notes (Signed)
ANTICOAGULATION CONSULT NOTE  Pharmacy Consult for heparin Indication: pulmonary embolus  Allergies  Allergen Reactions   Ciprofloxacin Rash    Patient Measurements: Height: 5\' 7"  (170.2 cm) Weight: 81.6 kg (180 lb) IBW/kg (Calculated) : 66.1 Heparin Dosing Weight: 81 kg  Vital Signs: Temp: 97.9 F (36.6 C) (12/22 2341) Temp Source: Oral (12/22 2341) BP: 112/68 (12/23 0300) Pulse Rate: 71 (12/23 0300)  Labs: Recent Labs    08/31/21 1748 09/01/21 0252  HGB 14.7  --   HCT 43.9  --   PLT 339  --   APTT 30  --   LABPROT 13.6  --   INR 1.0  --   HEPARINUNFRC  --  0.37  CREATININE 1.10  --      Estimated Creatinine Clearance: 57.5 mL/min (by C-G formula based on SCr of 1.1 mg/dL).   Medical History: Past Medical History:  Diagnosis Date   Diverticulitis    Enlarged prostate    Gallstones    Hypertension    Hyperthyroidism      Assessment: 75 year old male with abdominal pain sent for possible PE on outpatient CT imaging done 12/22. No anticoagulation noted PTA. Pharmacy consulted for heparin.  Goal of Therapy:  Heparin level 0.3-0.7 units/ml Monitor platelets by anticoagulation protocol: Yes   Plan:  12/23:  HL @ 0252 = 0.37, therapeutic X 1 Will continue pt on current rate and draw confirmation level in 8 hrs on 12/23 @ 1100.   Clinical Pharmacist 09/01/2021 4:07 AM

## 2021-09-01 NOTE — Progress Notes (Signed)
ANTICOAGULATION CONSULT NOTE  Pharmacy Consult for heparin post IR procedure Indication: pulmonary embolus  Allergies  Allergen Reactions   Ciprofloxacin Rash    Patient Measurements: Height: 5\' 7"  (170.2 cm) Weight: 81.6 kg (180 lb) IBW/kg (Calculated) : 66.1 Heparin Dosing Weight: 81 kg  Vital Signs: Temp: 98 F (36.7 C) (12/23 0616) BP: 130/74 (12/23 1350) Pulse Rate: 78 (12/23 1350)  Labs: Recent Labs    08/31/21 1748 09/01/21 0252 09/01/21 0500  HGB 14.7  --  12.9*  HCT 43.9  --  39.0  PLT 339  --  311  APTT 30  --   --   LABPROT 13.6  --   --   INR 1.0  --   --   HEPARINUNFRC  --  0.37  --   CREATININE 1.10  --  0.95     Estimated Creatinine Clearance: 66.6 mL/min (by C-G formula based on SCr of 0.95 mg/dL).   Medical History: Past Medical History:  Diagnosis Date   Diverticulitis    Enlarged prostate    Gallstones    Hypertension    Hyperthyroidism      Assessment: 75 year old male with abdominal pain sent for possible PE on outpatient CT imaging done 12/22. No anticoagulation noted PTA. Pharmacy consulted for restarting heparin post IR procedure with standard bleeding risk.  Goal of Therapy:  Heparin level 0.3-0.7 units/ml Monitor platelets by anticoagulation protocol: Yes   Plan:  IR procedure completed on 12/23 around 1430. Will restart heparin at previous rate of 1300 units/hr starting at 2030 on 12/23 (6 hours post IR for standard bleeding risk) Check HL 8 hours after restart CBC daily while on heparin  1/24, PharmD Clinical Pharmacist  09/01/2021 2:37 PM

## 2021-09-02 DIAGNOSIS — K5720 Diverticulitis of large intestine with perforation and abscess without bleeding: Secondary | ICD-10-CM | POA: Diagnosis not present

## 2021-09-02 DIAGNOSIS — K651 Peritoneal abscess: Secondary | ICD-10-CM | POA: Diagnosis not present

## 2021-09-02 LAB — MAGNESIUM: Magnesium: 2.1 mg/dL (ref 1.7–2.4)

## 2021-09-02 LAB — BASIC METABOLIC PANEL
Anion gap: 6 (ref 5–15)
BUN: 11 mg/dL (ref 8–23)
CO2: 23 mmol/L (ref 22–32)
Calcium: 8.6 mg/dL — ABNORMAL LOW (ref 8.9–10.3)
Chloride: 105 mmol/L (ref 98–111)
Creatinine, Ser: 0.79 mg/dL (ref 0.61–1.24)
GFR, Estimated: >60 mL/min (ref >60)
Glucose, Bld: 86 mg/dL (ref 70–99)
Potassium: 3.9 mmol/L (ref 3.5–5.1)
Sodium: 134 mmol/L — ABNORMAL LOW (ref 135–145)

## 2021-09-02 LAB — HEPARIN LEVEL (UNFRACTIONATED)
Heparin Unfractionated: 0.14 IU/mL — ABNORMAL LOW (ref 0.30–0.70)
Heparin Unfractionated: 0.28 IU/mL — ABNORMAL LOW (ref 0.30–0.70)

## 2021-09-02 LAB — HEMOGLOBIN A1C
Hgb A1c MFr Bld: 8.5 % — ABNORMAL HIGH (ref 4.8–5.6)
Mean Plasma Glucose: 197 mg/dL

## 2021-09-02 LAB — CBC
HCT: 40.6 % (ref 39.0–52.0)
Hemoglobin: 13.4 g/dL (ref 13.0–17.0)
MCH: 29.5 pg (ref 26.0–34.0)
MCHC: 33 g/dL (ref 30.0–36.0)
MCV: 89.2 fL (ref 80.0–100.0)
Platelets: 315 10*3/uL (ref 150–400)
RBC: 4.55 MIL/uL (ref 4.22–5.81)
RDW: 13.2 % (ref 11.5–15.5)
WBC: 11.5 10*3/uL — ABNORMAL HIGH (ref 4.0–10.5)
nRBC: 0 % (ref 0.0–0.2)

## 2021-09-02 LAB — GLUCOSE, CAPILLARY
Glucose-Capillary: 104 mg/dL — ABNORMAL HIGH (ref 70–99)
Glucose-Capillary: 129 mg/dL — ABNORMAL HIGH (ref 70–99)
Glucose-Capillary: 95 mg/dL (ref 70–99)
Glucose-Capillary: 105 mg/dL — ABNORMAL HIGH (ref 70–99)

## 2021-09-02 MED ORDER — HEPARIN BOLUS VIA INFUSION
1200.0000 [IU] | Freq: Once | INTRAVENOUS | Status: DC
  Filled 2021-09-02: qty 1200

## 2021-09-02 MED ORDER — HEPARIN BOLUS VIA INFUSION
1200.0000 [IU] | Freq: Once | INTRAVENOUS | Status: AC
  Administered 2021-09-02: 17:00:00 1200 [IU] via INTRAVENOUS
  Filled 2021-09-02: qty 1200

## 2021-09-02 MED ORDER — HEPARIN BOLUS VIA INFUSION
2400.0000 [IU] | Freq: Once | INTRAVENOUS | Status: AC
  Administered 2021-09-02: 07:00:00 2400 [IU] via INTRAVENOUS
  Filled 2021-09-02: qty 2400

## 2021-09-02 NOTE — Progress Notes (Signed)
ANTICOAGULATION CONSULT NOTE  Pharmacy Consult for heparin post IR procedure Indication: pulmonary embolus  Allergies  Allergen Reactions   Ciprofloxacin Rash    Patient Measurements: Height: 5\' 7"  (170.2 cm) Weight: 81.6 kg (180 lb) IBW/kg (Calculated) : 66.1 Heparin Dosing Weight: 81 kg  Vital Signs: Temp: 97.6 F (36.4 C) (12/24 1253) Temp Source: Oral (12/24 1253) BP: 122/70 (12/24 1253) Pulse Rate: 81 (12/24 1253)  Labs: Recent Labs    08/31/21 1748 09/01/21 0252 09/01/21 0500 09/02/21 0419 09/02/21 0553 09/02/21 1501  HGB 14.7  --  12.9* 13.4  --   --   HCT 43.9  --  39.0 40.6  --   --   PLT 339  --  311 315  --   --   APTT 30  --   --   --   --   --   LABPROT 13.6  --   --   --   --   --   INR 1.0  --   --   --   --   --   HEPARINUNFRC  --  0.37  --   --  0.14* 0.28*  CREATININE 1.10  --  0.95 0.79  --   --      Estimated Creatinine Clearance: 79.1 mL/min (by C-G formula based on SCr of 0.79 mg/dL).   Medical History: Past Medical History:  Diagnosis Date   Diverticulitis    Enlarged prostate    Gallstones    Hypertension    Hyperthyroidism      Assessment: 75 year old male with abdominal pain sent for possible PE on outpatient CT imaging done 12/22. No anticoagulation noted PTA. Pharmacy consulted for restarting heparin post IR procedure with standard bleeding risk.  Date Time HL Rate/comment 12/24 0553 0.14 1300 un/hr, subtherapeutic  12/24 1501 0.28 1600 un/hr, subtherapeutic  Goal of Therapy:  Heparin level 0.3-0.7 units/ml Monitor platelets by anticoagulation protocol: Yes   Plan:  Heparin level 0.28, subtherapeutic Will order heparin 1200 units IV X 1 and increase drip rate to 1750 units/hr Will recheck HL 8 hrs after rate change Monitor daily CBC, s/s of bleed   1/25, PharmD Clinical Pharmacist  09/02/2021 4:45 PM

## 2021-09-02 NOTE — Progress Notes (Signed)
[facility] SURGICAL PROGRESS NOTE (cpt [phone])  Hospital Day(s): 2.   Post op day(s):  Marland Kitchen   Interval History: Patient seen and examined, no acute events or new complaints overnight. Patient reports improved pain, tolerating clear liquid diet, and denies fevers and chills.  States his pain now is only when he coughs and only at the sites of the drains.  He sitting up watching football game, and conversing on the phone.  Review of Systems:  Constitutional: denies fever, chills  HEENT: denies cough or congestion  Respiratory: denies any shortness of breath  Cardiovascular: denies chest pain or palpitations  Gastrointestinal: denies new abdominal pain, N/V, or diarrhea Genitourinary: denies burning with urination or urinary frequency Musculoskeletal: denies pain, decreased motor or sensation Integumentary: denies any other rashes or skin discolorations Neurological: denies HA or vision/hearing changes   Vital signs in last 24 hours: [min-max] current  Temp:  [97.5 F (36.4 C)-98.5 F (36.9 C)] 97.5 F (36.4 C) (12/24 0816) Pulse Rate:  [70-83] 72 (12/24 0816) Resp:  [17-27] 17 (12/24 0816) BP: (123-138)/(71-88) 138/78 (12/24 0816) SpO2:  [91 %-96 %] 94 % (12/24 0816)     Height: 5\' 7"  (170.2 cm) Weight: 81.6 kg BMI (Calculated): 28.19   Intake/Output last 2 shifts:  12/23 0701 - 12/24 0700 In: 5  Out: 440 [Urine:400; Drains:40]   Physical Exam:  Constitutional: alert, cooperative and no distress  HENT: normocephalic without obvious abnormality  Eyes: EOM's grossly intact and symmetric  Neuro: CN II - XII grossly intact and symmetric without deficit  Respiratory: breathing non-labored at rest  Cardiovascular: regular rate and sinus rhythm  Gastrointestinal: soft, non-tender, readily reducible umbilical hernia and non-distended.  To left lower quadrant drains, both with only residual serosanguineous fluid in tubing and in passive collection bags. Musculoskeletal:  UE and LE FROM, no edema or wounds, motor and sensation grossly intact, NT    Labs:  CBC Latest Ref Rng & Units 09/02/2021 09/01/2021 08/31/2021  WBC 4.0 - 10.5 K/uL 11.5(H) 14.1(H) 15.7(H)  Hemoglobin 13.0 - 17.0 g/dL 09/02/2021 12.9(L) 14.7  Hematocrit 39.0 - 52.0 % 40.6 39.0 43.9  Platelets 150 - 400 K/uL 315 311 339   CMP Latest Ref Rng & Units 09/02/2021 09/01/2021 08/31/2021  Glucose 70 - 99 mg/dL 86 09/02/2021) 102(V)  BUN 8 - 23 mg/dL 11 14 19   Creatinine 0.61 - 1.24 mg/dL 253(G 6.44  Sodium 135 - 145 mmol/L 134(L) 135 132(L)  Potassium 3.5 - 5.1 mmol/L 3.9 3.8 4.0  Chloride 98 - 111 mmol/L 105 105 97(L)  CO2 22 - 32 mmol/L 23 23 25   Calcium 8.9 - 10.3 mg/dL 0.34) 8.3(L) 9.1  Total Protein 6.5 - 8.1 g/dL - 6.8 7.42)  Total Bilirubin 0.3 - 1.2 mg/dL - 0.9 0.8  Alkaline Phos 38 - 126 U/L - 84 106  AST 15 - 41 U/L - 31 37  ALT 0 - 44 U/L - 43 52(H)     Imaging studies: No new pertinent imaging studies   Assessment/Plan:  75 y.o. male with abdominal pelvic abscesses   s/p PERC drain placement x2 for likely diverticular abscess, complicated by pertinent comorbidities including. He appears to be responding very well without evidence of sepsis and with excellent resolution of abdominal pain following PERC drain placement. Patient Active Problem List   Diagnosis Date Noted   CAD (coronary artery disease) 08/31/2021   Type 2 diabetes mellitus (HCC) 08/31/2021   Abdominal pain 08/31/2021   Intra-abdominal abscess (HCC)  08/31/2021   Pulmonary embolism ([place]) 08/31/2021   TIA (transient ischemic attack) 12/22/2015   Benign essential hypertension 09/16/2015   Graves' disease 10/06/2014     -Continue IV antibiotics.  Zosyn.   -Continue drains.  -May be able to gradually advance diet starting tomorrow if pain continues to improve.  -Anticipate likely reimaging early midweek, pending continued progress clinically.  -Appreciate medical management, we will follow.  -Heparin drip for  pulmonary embolism. - No emergent surgical intervention at this time. Patient and his family understand that our goal is to manage this conservatively. Should he fail to improve or clinically deteriorate, they understand we would need to proceed more urgently and that this would likely result in temporizing colostomy.    -Continue serial abdominal exams.  -Further management per primary service, we will follow-up.  All of the above findings and recommendations were discussed with the patient, and the medical team, and all of patient's were answered to his expressed satisfaction.   -- Ronny Bacon M.D., [facility] 09/02/2021 12:21 PM

## 2021-09-02 NOTE — Progress Notes (Signed)
PROGRESS NOTE    Todd Hansen Sr.  JKD:[id number] DOB: [birth year] DOA: 08/31/2021 PCP: Barbette Reichmann, MD  [phone]   Assessment & Plan:   Principal Problem:   Abdominal pain Active Problems:   TIA (transient ischemic attack)   CAD (coronary artery disease)   Benign essential hypertension   Type 2 diabetes mellitus (HCC)   Intra-abdominal abscess (HCC)   Pulmonary embolism (HCC)   [name] is a 75 y.o. male with hx of diverticulitis, BPH, gallstones, hypertension, hyPERthyroidism who presented to ED with complaints of abdominal pain that has been going on for nearly 2 weeks.  Pt had been treated for diverticulitis with antibiotics for the past 2 weeks    # Complicated diverticulitis with abscess S/p drain placement by IR on 12/23 --GenSurg consulted, No emergent surgical intervention at this time. --about 120 ml purulent material drained by IR, cx sent. --started on IV zosyn PLAN:  --cont IV zosyn --monitor drain output --f/u abscess cx --clear liquid diet, per GenSurg  # Sepsis, POA --tachycardia and WBC 15.7, source above  # Chronic PE, POA --newly dx during this admission, likely due to his covid infection.  ordered heparin gtt on admission, held for IR procedure. --cont heparin gtt for now  # HTN --on home amlodipine, irbesartan, HCTZ, Imdur and Toprol --cont home regimen  # HyPERthyroidism --cont home propylthiouracil   DVT prophylaxis: NL:ZJQBHAL gtt Code Status: Full code  Family Communication:   Level of care: Med-Surg Dispo:   The patient is from: home Anticipated d/c is to: home Anticipated d/c date is: > 3 days Patient currently is not medically ready to d/c due to: intra-abdominal abscesses, on IV abx, clear liquid diet   Subjective and Interval History:  Pt reported pain in his drain sites only when he coughed.  Pt said he has been having a nagging cough since he had COVID.  Having soft stool.   Objective: Vitals:    09/02/21 1253 09/02/21 1725 09/02/21 1727 09/02/21 1954  BP: 122/70  113/78 132/67  Pulse: 81 89 88 81  Resp: 17  18 16   Temp: 97.6 F (36.4 C)  98.4 F (36.9 C) 98.2 F (36.8 C)  TempSrc: Oral  Oral Oral  SpO2: 93% 91% 93% 93%  Weight:      Height:        Intake/Output Summary (Last 24 hours) at 09/02/2021 2241 Last data filed at 09/02/2021 1500 Gross per 24 hour  Intake 358.21 ml  Output 485 ml  Net -126.79 ml   Filed Weights   08/31/21 1747  Weight: 81.6 kg    Examination:   Constitutional: NAD, AAOx3 HEENT: conjunctivae and lids normal, EOMI CV: No cyanosis.   RESP: normal respiratory effort, on RA GI: 2 drains from left side of abdomen draining red-tan fluids. Extremities: No effusions, edema in BLE SKIN: warm, dry Neuro: II - XII grossly intact.   Psych: Normal mood and affect.  Appropriate judgement and reason   Data Reviewed: I have personally reviewed following labs and imaging studies  CBC: Recent Labs  Lab 08/31/21 1748 09/01/21 0500 09/02/21 0419  WBC 15.7* 14.1* 11.5*  HGB 14.7 12.9* 13.4  HCT 43.9 39.0 40.6  MCV 90.7 89.9 89.2  PLT 339 311 315   Basic Metabolic Panel: Recent Labs  Lab 08/31/21 1748 09/01/21 0500 09/02/21 0419  NA 132* 135 134*  K 4.0 3.8 3.9  CL 97* 105 105  CO2 25 23 23   GLUCOSE 111* 114* 86  BUN 19 14 11   CREATININE 1.10 0.95 0.79  CALCIUM 9.1 8.3* 8.6*  MG  --   --  2.1   GFR: Estimated Creatinine Clearance: 79.1 mL/min (by C-G formula based on SCr of 0.79 mg/dL). Liver Function Tests: Recent Labs  Lab 08/31/21 1748 09/01/21 0500  AST 37 31  ALT 52* 43  ALKPHOS 106 84  BILITOT 0.8 0.9  PROT 8.2* 6.8  ALBUMIN 3.2* 2.5*   Recent Labs  Lab 08/31/21 1748  LIPASE 44   No results for input(s): AMMONIA in the last 168 hours. Coagulation Profile: Recent Labs  Lab 08/31/21 1748  INR 1.0   Cardiac Enzymes: No results for input(s): CKTOTAL, CKMB, CKMBINDEX, TROPONINI in the last 168 hours. BNP  (last 3 results) No results for input(s): PROBNP in the last 8760 hours. HbA1C: Recent Labs    08/31/21 1748  HGBA1C 8.5*   CBG: Recent Labs  Lab 09/01/21 2206 09/02/21 0909 09/02/21 1250 09/02/21 1723 09/02/21 2128  GLUCAP 84 104* 129* 95 105*   Lipid Profile: No results for input(s): CHOL, HDL, LDLCALC, TRIG, CHOLHDL, LDLDIRECT in the last 72 hours. Thyroid Function Tests: No results for input(s): TSH, T4TOTAL, FREET4, T3FREE, THYROIDAB in the last 72 hours. Anemia Panel: No results for input(s): VITAMINB12, FOLATE, FERRITIN, TIBC, IRON, RETICCTPCT in the last 72 hours. Sepsis Labs: Recent Labs  Lab 08/31/21 1810  LATICACIDVEN 1.3    Recent Results (from the past 240 hour(s))  Resp Panel by RT-PCR (Flu A&B, Covid) Nasopharyngeal Swab     Status: None   Collection Time: 08/31/21  6:10 PM   Specimen: Nasopharyngeal Swab; Nasopharyngeal(NP) swabs in vial transport medium  Result Value Ref Range Status   SARS Coronavirus 2 by RT PCR NEGATIVE NEGATIVE Final    Comment: (NOTE) SARS-CoV-2 target nucleic acids are NOT DETECTED.  The SARS-CoV-2 RNA is generally detectable in upper respiratory specimens during the acute phase of infection. The lowest concentration of SARS-CoV-2 viral copies this assay can detect is 138 copies/mL. A negative result does not preclude SARS-Cov-2 infection and should not be used as the sole basis for treatment or other patient management decisions. A negative result may occur with  improper specimen collection/handling, submission of specimen other than nasopharyngeal swab, presence of viral mutation(s) within the areas targeted by this assay, and inadequate number of viral copies(<138 copies/mL). A negative result must be combined with clinical observations, patient history, and epidemiological information. The expected result is Negative.  Fact Sheet for Patients:  BloggerCourse.com  Fact Sheet for Healthcare  Providers:  SeriousBroker.it  This test is no t yet approved or cleared by the [place] FDA and  has been authorized for detection and/or diagnosis of SARS-CoV-2 by FDA under an Emergency Use Authorization (EUA). This EUA will remain  in effect (meaning this test can be used) for the duration of the COVID-19 declaration under Section 564(b)(1) of the Act, 21 U.S.C.section 360bbb-3(b)(1), unless the authorization is terminated  or revoked sooner.       Influenza A by PCR NEGATIVE NEGATIVE Final   Influenza B by PCR NEGATIVE NEGATIVE Final    Comment: (NOTE) The Xpert Xpress SARS-CoV-2/FLU/RSV plus assay is intended as an aid in the diagnosis of influenza from Nasopharyngeal swab specimens and should not be used as a sole basis for treatment. Nasal washings and aspirates are unacceptable for Xpert Xpress SARS-CoV-2/FLU/RSV testing.  Fact Sheet for Patients: BloggerCourse.com  Fact Sheet for Healthcare Providers: SeriousBroker.it  This test is not yet approved  41(78A Frame3657XAdvanced MiWhite Mountain Reg<BARosita FireC91823-8Doreat22ma M859-7Johney Frame0969nAdvanced MiSacred Heart Hospital On The Gulfcro Devicescro evi<BARosita FireC73152-2Doreat15ma M(702)87Johney Frame571dvAdvanced MiSportsortho Surgery Center LLCcro DevicesT AG>o DevRosita FireC47827-5Doreat38ma M276-2Johney Frame5706vAdvanced MiHattiesburg Surgery Center LCcro DevicMarland KitchenesTTAG> Devices MarlAdvancMarland Kitchen d MicDoris5792mine929-5Johney Frame8561TAdvanced MiSilver Oaks Behavorial Hospitalcro Device ta FiRosita [phone] [phone])09Johney Frame067aAAdvanced MiCamden County Health Services Centercro DevicesTAG>cro evicMaRosita FireC([phone])83Doreat73ma M(279)17Johney Frame162vaAdvanced MiWilson Memorial Hospitalcro Device TAG> DevicesMarland KitchenAdvanced MicDor stine57846MarMarland Kitchenland KitchenoLin ood DibblMarland Kitchenes en 209-865RosiRosita Fire42Marland KitchenC4Doreat97ma M(4 34)32JohneyRosita Fire1CDoreat33ma M805-7Johney Frame4727nAdvanced MiMt aurel Rosita FireC8614-Doreat28ma M984-8Johney Frame8604 Advanced MiGeorgiana Med cal CentercMarland Kitchenro DevicesXTTAG> DevicAdvanMarland KitchenMarland Kitchenced MicDoristin< ADTEXTTAGMarland Kitchen>57846oLinwRosit<BADTEMarland KitchenXTTAG> FireDDanR sita FireRosita FireC31460 3Doreat82ma M605-1Johney Frame4816oAdvanced MiLittle River Healthcare - Came<BADTRosita FireC509102 40Doreat67ma M231-8Johney Frame1416KAdvanced MiJewish [facility]'S HeRosita FireC91870-0Doreatha810-70Doristine [phone]<BMarland KitchenADTEXTTAG>hennbMarland Kitchen< ADT MarlaMarlMarland Kitchenand Kitchennd Ki<BADTEXTTAGMarland Kitchen>chenXTTAGRosit FireDTDanRosita FireC9109-Doreat64ma M2Marland Kitchen36-2Johney Fram 2477AdvancRosita FireC80461-4Doreat35ma M(330) 410Johney Frame45es<Advanc d MiSurgiRosita FireC([phone] MassednManhattan L LCcro DevicMarland KitchenesnTEXTTAG>  on the CT gantry and a pre procedural CT was performed re-demonstrating the known abscess/fluid collection within the left lower abdomen with dominant air and fluid containing collection measuring approximately 8.3 x 4.3 cm (image 48, series 2) and smaller pericolonic collection measuring approximately 3.1 x 2.2 cm (image 51, series 2). The procedure was planned. A timeout was performed prior to the initiation of the procedure. The skin overlying the inferolateral aspect the left abdomen was prepped and draped in the usual sterile fashion. The overlying soft tissues were anesthetized with 1% lidocaine with epinephrine. 18 gauge trocar needles were utilized to target both collections separately, ultimately allowing coiling of short Amplatz wires  within both collections. Appropriate positioning was confirmed with CT imaging. Next, both tracks were serially dilated allowing placement of 10 French all-purpose drainage catheters at both locations. Appropriate position was confirmed with CT imaging (series 9). Next, a total of approximately 120 cc of purulent fluid was aspirated from both collections. A representative sample of aspirated fluid was capped and sent to the laboratory for analysis. Both drainage catheters were connected to gravity bags and secured in place with interrupted sutures. Dressings were applied. The patient tolerated the procedure well without immediate post procedural complication. IMPRESSION: 1. Successful CT-guided placement a 10 French all-purpose drainage catheter into dominant collection within the left lower abdomen/upper pelvis 2. Successful CT-guided placement of a 10 French all-purpose drainage catheter into the smaller pericolonic diverticular abscess within the left lower abdomen 3. A total of approximately 120 cc of purulent fluid was aspirated from both collections. A representative sample of aspirated fluid was capped and sent to the laboratory for analysis. Electronically Signed   By: Simonne Come M.D.   On: 09/01/2021 14:51   CT IMAGE GUIDED DRAINAGE BY PERCUTANEOUS CATHETER  Result Date: 09/01/2021 INDICATION: Diverticular abscesses. Please perform CT-guided aspiration and/or drainage catheter placement(s). EXAM: CT IMAGE GUIDED DRAINAGE BY PERCUTANEOUS CATHETER x2 COMPARISON:  CT abdomen pelvis-08/31/2021 MEDICATIONS: The patient is currently admitted to the hospital and receiving intravenous antibiotics. The antibiotics were administered within an appropriate time frame prior to the initiation of the procedure. ANESTHESIA/SEDATION: Moderate (conscious) sedation was employed during this procedure. A total of Versed 1 mg and Fentanyl 50 mcg was administered intravenously. Moderate Sedation Time: 16 minutes. The  patient's level of consciousness and vital signs were monitored continuously by radiology nursing throughout the procedure under my direct supervision. CONTRAST:  None COMPLICATIONS: None immediate. PROCEDURE: Informed written consent was obtained from the patient after a discussion of the risks, benefits and alternatives to treatment. The patient was placed supine on the CT gantry and a pre procedural CT was performed re-demonstrating the known abscess/fluid collection within the left lower abdomen with dominant air and fluid containing collection measuring approximately 8.3 x 4.3 cm (image 48, series 2) and smaller pericolonic collection measuring approximately 3.1 x 2.2 cm (image 51, series 2). The procedure was planned. A timeout was performed prior to the initiation of the procedure. The skin overlying the inferolateral aspect the left abdomen was prepped and draped in the usual sterile fashion. The overlying soft tissues were anesthetized with 1% lidocaine with epinephrine. 18 gauge trocar needles were utilized to target both collections separately, ultimately allowing coiling of short Amplatz wires within both collections. Appropriate positioning was confirmed with CT imaging. Next, both tracks were serially dilated allowing placement of 10 French all-purpose drainage catheters at both locations. Appropriate position was confirmed with CT imaging (series 9). Next, a total of approximately 120 cc  of purulent fluid was aspirated from both collections. A representative sample of aspirated fluid was capped and sent to the laboratory for analysis. Both drainage catheters were connected to gravity bags and secured in place with interrupted sutures. Dressings were applied. The patient tolerated the procedure well without immediate post procedural complication. IMPRESSION: 1. Successful CT-guided placement a 10 French all-purpose drainage catheter into dominant collection within the left lower abdomen/upper pelvis 2.  Successful CT-guided placement of a 10 French all-purpose drainage catheter into the smaller pericolonic diverticular abscess within the left lower abdomen 3. A total of approximately 120 cc of purulent fluid was aspirated from both collections. A representative sample of aspirated fluid was capped and sent to the laboratory for analysis. Electronically Signed   By: Simonne Come M.D.   On: 09/01/2021 14:51     Scheduled Meds:  amLODipine  5 mg Oral BID   atorvastatin  80 mg Oral QHS   irbesartan  300 mg Oral Daily   And   hydrochlorothiazide  12.5 mg Oral Daily   insulin aspart  0-9 Units Subcutaneous TID WC   isosorbide mononitrate  60 mg Oral Daily   metoprolol succinate  12.5 mg Oral Daily   propylthiouracil  25 mg Oral BID   sodium chloride flush  3 mL Intravenous Q12H   sodium chloride flush  5 mL Intracatheter Q8H   Continuous Infusions:  heparin 1,750 Units/hr (09/02/21 1729)   piperacillin-tazobactam (ZOSYN)  IV 3.375 g (09/02/21 1736)     LOS: 2 days     Darlin Priestly, MD [facility] If 7PM-7AM, please contact night-coverage 09/02/2021, 10:41 PM

## 2021-09-02 NOTE — Progress Notes (Signed)
ANTICOAGULATION CONSULT NOTE  Pharmacy Consult for heparin post IR procedure Indication: pulmonary embolus  Allergies  Allergen Reactions   Ciprofloxacin Rash    Patient Measurements: Height: 5\' 7"  (170.2 cm) Weight: 81.6 kg (180 lb) IBW/kg (Calculated) : 66.1 Heparin Dosing Weight: 81 kg  Vital Signs: Temp: 98.3 F (36.8 C) (12/24 0505) Temp Source: Oral (12/24 0505) BP: 127/76 (12/24 0505) Pulse Rate: 70 (12/24 0505)  Labs: Recent Labs    08/31/21 1748 09/01/21 0252 09/01/21 0500 09/02/21 0419 09/02/21 0553  HGB 14.7  --  12.9* 13.4  --   HCT 43.9  --  39.0 40.6  --   PLT 339  --  311 315  --   APTT 30  --   --   --   --   LABPROT 13.6  --   --   --   --   INR 1.0  --   --   --   --   HEPARINUNFRC  --  0.37  --   --  0.14*  CREATININE 1.10  --  0.95 0.79  --      Estimated Creatinine Clearance: 79.1 mL/min (by C-G formula based on SCr of 0.79 mg/dL).   Medical History: Past Medical History:  Diagnosis Date   Diverticulitis    Enlarged prostate    Gallstones    Hypertension    Hyperthyroidism      Assessment: 75 year old male with abdominal pain sent for possible PE on outpatient CT imaging done 12/22. No anticoagulation noted PTA. Pharmacy consulted for restarting heparin post IR procedure with standard bleeding risk.  Goal of Therapy:  Heparin level 0.3-0.7 units/ml Monitor platelets by anticoagulation protocol: Yes   Plan:  12/24:  HL @ 0553 = 0.14, subtherapeutic  Will order heparin 2400 units IV X 1 and increase drip rate to 1600 units/hr.  Will recheck HL 8 hrs after rate change.   1/25, PharmD Clinical Pharmacist  09/02/2021 7:03 AM

## 2021-09-03 DIAGNOSIS — K5720 Diverticulitis of large intestine with perforation and abscess without bleeding: Secondary | ICD-10-CM | POA: Diagnosis not present

## 2021-09-03 DIAGNOSIS — K651 Peritoneal abscess: Secondary | ICD-10-CM | POA: Diagnosis not present

## 2021-09-03 LAB — CBC
HCT: 41.8 % (ref 39.0–52.0)
Hemoglobin: 13.8 g/dL (ref 13.0–17.0)
MCH: 29.3 pg (ref 26.0–34.0)
MCHC: 33 g/dL (ref 30.0–36.0)
MCV: 88.7 fL (ref 80.0–100.0)
Platelets: 335 10*3/uL (ref 150–400)
RBC: 4.71 MIL/uL (ref 4.22–5.81)
RDW: 13.3 % (ref 11.5–15.5)
WBC: 14.5 10*3/uL — ABNORMAL HIGH (ref 4.0–10.5)
nRBC: 0 % (ref 0.0–0.2)

## 2021-09-03 LAB — BASIC METABOLIC PANEL
Anion gap: 9 (ref 5–15)
BUN: 9 mg/dL (ref 8–23)
CO2: 21 mmol/L — ABNORMAL LOW (ref 22–32)
Calcium: 8.5 mg/dL — ABNORMAL LOW (ref 8.9–10.3)
Chloride: 102 mmol/L (ref 98–111)
Creatinine, Ser: 1.15 mg/dL (ref 0.61–1.24)
GFR, Estimated: >60 mL/min (ref >60)
Glucose, Bld: 106 mg/dL — ABNORMAL HIGH (ref 70–99)
Potassium: 3.8 mmol/L (ref 3.5–5.1)
Sodium: 132 mmol/L — ABNORMAL LOW (ref 135–145)

## 2021-09-03 LAB — MAGNESIUM: Magnesium: 2 mg/dL (ref 1.7–2.4)

## 2021-09-03 LAB — HEPARIN LEVEL (UNFRACTIONATED)
Heparin Unfractionated: 0.52 IU/mL (ref 0.30–0.70)
Heparin Unfractionated: 0.77 IU/mL — ABNORMAL HIGH (ref 0.30–0.70)
Heparin Unfractionated: 0.6 IU/mL (ref 0.30–0.70)

## 2021-09-03 MED ORDER — HYDROCODONE BIT-HOMATROP MBR 5-1.5 MG/5ML PO SOLN
5.0000 mL | Freq: Every day | ORAL | Status: DC
  Administered 2021-09-03: 22:00:00 5 mL via ORAL
  Filled 2021-09-03: qty 5

## 2021-09-03 NOTE — Progress Notes (Signed)
ANTICOAGULATION CONSULT NOTE  Pharmacy Consult for heparin post IR procedure Indication: pulmonary embolus  Patient Measurements: Height: 5\' 7"  (170.2 cm) Weight: 81.6 kg (180 lb) IBW/kg (Calculated) : 66.1 Heparin Dosing Weight: 81 kg  Labs: Recent Labs    09/01/21 0500 09/02/21 0419 09/02/21 0553 09/03/21 0106 09/03/21 0842 09/03/21 1756  HGB 12.9* 13.4  --   --  13.8  --   HCT 39.0 40.6  --   --  41.8  --   PLT 311 315  --   --  335  --   HEPARINUNFRC  --   --    < > 0.52 0.77* 0.60  CREATININE 0.95 0.79  --   --  1.15  --    < > = values in this interval not displayed.     Estimated Creatinine Clearance: 55 mL/min (by C-G formula based on SCr of 1.15 mg/dL).   Medical History: Past Medical History:  Diagnosis Date   Diverticulitis    Enlarged prostate    Gallstones    Hypertension    Hyperthyroidism      Assessment: 75 year old male with abdominal pain sent for possible PE on outpatient CT imaging done 12/22. No anticoagulation noted PTA. Pharmacy consulted for restarting heparin post IR procedure with standard bleeding risk.  Date Time HL Rate/comment 1224 0553 0.14 1300 un/hr, subtherapeutic  1224 1501 0.28 1600 un/hr, subtherapeutic 1225 0106   0.52     1750 un/hr, therapeutic x 1  1225 0842 0.77 1750 un/hr, supratherapeutic 1225 1756 0.60 1700 un/hr, therapeutic x 1  Goal of Therapy:  Heparin level 0.3-0.7 units/ml Monitor platelets by anticoagulation protocol: Yes   Plan:  Heparin level 0.60, therapeutic Continue heparin infusion at 1700 units/hr Check confirmatory HL in 8 hours  Monitor daily CBC, s/s of bleed   1/23, PharmD 09/03/2021 7:04 PM

## 2021-09-03 NOTE — Progress Notes (Addendum)
09/03/2021  Subjective: No acute events.  Patient's WBC had been improving and down to 11.5, but is elevated this morning to 14.5.  Clinically, the patient denies any significant pain and reports only some tenderness where the drains go into his abdomen.  Reports having flatus and bowel movements.  Denies any nausea or abdominal distention and had been tolerating clears.  Vital signs: Temp:  [97.5 F (36.4 C)-98.4 F (36.9 C)] 97.5 F (36.4 C) (12/25 0916) Pulse Rate:  [74-89] 74 (12/25 0916) Resp:  [16-20] 18 (12/25 0916) BP: (112-132)/(67-78) 121/75 (12/25 0916) SpO2:  [91 %-94 %] 94 % (12/25 0916)   Intake/Output: 12/24 0701 - 12/25 0700 In: 358.2 [I.V.:248.2; IV Piggyback:100] Out: 550 [Urine:500; Drains:50] Last BM Date: 09/02/21  Physical Exam: Constitutional:  No acute distress Abdomen:  soft, non-distended, non-tender to palpation with only soreness at the drain insertion sites.  Drains with seropurulent fluid in bag/tubing.  No peritonitis.  Labs:  Recent Labs    09/02/21 0419 09/03/21 0842  WBC 11.5* 14.5*  HGB 13.4 13.8  HCT 40.6 41.8  PLT 315 335   Recent Labs    09/02/21 0419 09/03/21 0842  NA 134* 132*  K 3.9 3.8  CL 105 102  CO2 23 21*  GLUCOSE 86 106*  BUN 11 9  CREATININE 0.79 1.15  CALCIUM 8.6* 8.5*   Recent Labs    08/31/21 1748  LABPROT 13.6  INR 1.0    Imaging: No results found.  Assessment/Plan: This is a 75 y.o. male with acute diverticulitis with abscesses, s/p percutaneous placement of two drains.  --Although his WBC is a bit more elevated today, clinically he continues to improve.  Will advance to full liquid diet today but will be slow to advance further for now. --Continue IV abx.  Cultures currently showing GNR.   --Anticipate repeating CT scan on 09/05/21, but may need to do sooner if his WBC continues to trend upward. --For now, would keep on heparin drip rather than transition to po anticoagulation in case any further  procedures are needed.  I spent 25 minutes dedicated to the care of this patient on the date of this encounter to include pre-visit review of records, face-to-face time with the patient discussing diagnosis and management, and any post-visit coordination of care.  Howie Ill, MD [facility]

## 2021-09-03 NOTE — Progress Notes (Signed)
ANTICOAGULATION CONSULT NOTE  Pharmacy Consult for heparin post IR procedure Indication: pulmonary embolus  Patient Measurements: Height: 5\' 7"  (170.2 cm) Weight: 81.6 kg (180 lb) IBW/kg (Calculated) : 66.1 Heparin Dosing Weight: 81 kg  Labs: Recent Labs    08/31/21 1748 09/01/21 0252 09/01/21 0500 09/02/21 0419 09/02/21 0553 09/02/21 1501 09/03/21 0106 09/03/21 0842  HGB 14.7  --  12.9* 13.4  --   --   --  13.8  HCT 43.9  --  39.0 40.6  --   --   --  41.8  PLT 339  --  311 315  --   --   --  335  APTT 30  --   --   --   --   --   --   --   LABPROT 13.6  --   --   --   --   --   --   --   INR 1.0  --   --   --   --   --   --   --   HEPARINUNFRC  --    < >  --   --    < > 0.28* 0.52 0.77*  CREATININE 1.10  --  0.95 0.79  --   --   --  1.15   < > = values in this interval not displayed.     Estimated Creatinine Clearance: 55 mL/min (by C-G formula based on SCr of 1.15 mg/dL).   Medical History: Past Medical History:  Diagnosis Date   Diverticulitis    Enlarged prostate    Gallstones    Hypertension    Hyperthyroidism      Assessment: 75 year old male with abdominal pain sent for possible PE on outpatient CT imaging done 12/22. No anticoagulation noted PTA. Pharmacy consulted for restarting heparin post IR procedure with standard bleeding risk.  Date Time HL Rate/comment 12/24 0553 0.14 1300 un/hr, subtherapeutic  12/24 1501 0.28 1600 un/hr, subtherapeutic 12/25   0106   0.52     1750 un/hr, therapeutic x 1  1225 0842 0.77 1750 un/hr, supratherapeutic  Goal of Therapy:  Heparin level 0.3-0.7 units/ml Monitor platelets by anticoagulation protocol: Yes   Plan:  --Heparin level is supratherapeutic --Decrease heparin infusion to 1700 units/hr --Re-check HL 8 hours after rate change --Daily CBC per protocol while on IV heparin  1/26 09/03/2021 9:31 AM

## 2021-09-03 NOTE — Progress Notes (Signed)
ANTICOAGULATION CONSULT NOTE  Pharmacy Consult for heparin post IR procedure Indication: pulmonary embolus  Allergies  Allergen Reactions   Ciprofloxacin Rash    Patient Measurements: Height: 5\' 7"  (170.2 cm) Weight: 81.6 kg (180 lb) IBW/kg (Calculated) : 66.1 Heparin Dosing Weight: 81 kg  Vital Signs: Temp: 98.2 F (36.8 C) (12/24 1954) Temp Source: Oral (12/24 1954) BP: 132/67 (12/24 1954) Pulse Rate: 81 (12/24 1954)  Labs: Recent Labs    08/31/21 1748 09/01/21 0252 09/01/21 0500 09/02/21 0419 09/02/21 0553 09/02/21 1501 09/03/21 0106  HGB 14.7  --  12.9* 13.4  --   --   --   HCT 43.9  --  39.0 40.6  --   --   --   PLT 339  --  311 315  --   --   --   APTT 30  --   --   --   --   --   --   LABPROT 13.6  --   --   --   --   --   --   INR 1.0  --   --   --   --   --   --   HEPARINUNFRC  --    < >  --   --  0.14* 0.28* 0.52  CREATININE 1.10  --  0.95 0.79  --   --   --    < > = values in this interval not displayed.     Estimated Creatinine Clearance: 79.1 mL/min (by C-G formula based on SCr of 0.79 mg/dL).   Medical History: Past Medical History:  Diagnosis Date   Diverticulitis    Enlarged prostate    Gallstones    Hypertension    Hyperthyroidism      Assessment: 75 year old male with abdominal pain sent for possible PE on outpatient CT imaging done 12/22. No anticoagulation noted PTA. Pharmacy consulted for restarting heparin post IR procedure with standard bleeding risk.  Date Time HL Rate/comment 12/24 0553 0.14 1300 un/hr, subtherapeutic  12/24 1501 0.28 1600 un/hr, subtherapeutic 12/25   0106   0.52     1750 un/hr, therapeutic X 1   Goal of Therapy:  Heparin level 0.3-0.7 units/ml Monitor platelets by anticoagulation protocol: Yes   Plan:  12/25:  HL @ 0106 = 0.52, therapeutic X 1  Will continue pt on current rate and recheck HL on 12/25 @ 0900.   1/26, PharmD Clinical Pharmacist  09/03/2021 1:59 AM

## 2021-09-03 NOTE — Plan of Care (Signed)

## 2021-09-03 NOTE — Progress Notes (Signed)
PROGRESS NOTE    Todd ROSEBOOM Sr.  HKV:[id number] DOB: 05-26-1946 DOA: 08/31/2021 PCP: Barbette Reichmann, MD  [phone]   Assessment & Plan:   Principal Problem:   Abdominal pain Active Problems:   TIA (transient ischemic attack)   CAD (coronary artery disease)   Benign essential hypertension   Type 2 diabetes mellitus (HCC)   Abdominopelvic abscess (HCC)   Pulmonary embolism (HCC)   Diverticulitis of large intestine with abscess without bleeding   Todd E Stepney Sr. is a 75 y.o. male with hx of diverticulitis, BPH, gallstones, hypertension, hyPERthyroidism who presented to ED with complaints of abdominal pain that has been going on for nearly 2 weeks.  Pt had been treated for diverticulitis with antibiotics for the past 2 weeks    # Complicated diverticulitis with abscess S/p drain placement by IR on 12/23 --GenSurg consulted, No emergent surgical intervention at this time. --about 120 ml purulent material drained by IR, cx sent. --started on IV zosyn PLAN:  --cont IV zosyn --monitor drain output --f/u abscess cx --advance to Full liquid, per Gensurg  # Sepsis, POA --tachycardia and WBC 15.7, source above  # Chronic PE, POA --newly dx during this admission, likely due to his covid infection.  ordered heparin gtt on admission, held for IR procedure. --cont heparin gtt for now  # HTN --on home amlodipine, irbesartan, HCTZ, Imdur and Toprol --cont home regimen  # HyPERthyroidism --cont home propylthiouracil  # post-COVID cough --hycodan nightly   DVT prophylaxis: AC:ZYSAYTK gtt Code Status: Full code  Family Communication:   Level of care: Med-Surg Dispo:   The patient is from: home Anticipated d/c is to: home Anticipated d/c date is: > 3 days Patient currently is not medically ready to d/c due to: intra-abdominal abscesses, on IV abx   Subjective and Interval History:  Pt complained of coughing triggering pain in his drain sites.      Objective: Vitals:   09/03/21 0322 09/03/21 0916 09/03/21 1620 09/03/21 2031  BP: 112/67 121/75 114/70 114/68  Pulse: 82 74 91 95  Resp: 20 18 18 20   Temp: 97.9 F (36.6 C) (!) 97.5 F (36.4 C) 98.1 F (36.7 C) 98.1 F (36.7 C)  TempSrc: Oral Oral Oral Oral  SpO2: 91% 94% 92% 94%  Weight:      Height:        Intake/Output Summary (Last 24 hours) at 09/03/2021 2057 Last data filed at 09/03/2021 2026 Gross per 24 hour  Intake 495.97 ml  Output 540 ml  Net -44.03 ml   Filed Weights   08/31/21 1747  Weight: 81.6 kg    Examination:   Constitutional: NAD, AAOx3 HEENT: conjunctivae and lids normal, EOMI CV: No cyanosis.   RESP: normal respiratory effort, on RA GI: 2 drains with tan colored fluid output Extremities: No effusions, edema in BLE SKIN: warm, dry Neuro: II - XII grossly intact.   Psych: Normal mood and affect.  Appropriate judgement and reason   Data Reviewed: I have personally reviewed following labs and imaging studies  CBC: Recent Labs  Lab 08/31/21 1748 09/01/21 0500 09/02/21 0419 09/03/21 0842  WBC 15.7* 14.1* 11.5* 14.5*  HGB 14.7 12.9* 13.4 13.8  HCT 43.9 39.0 40.6 41.8  MCV 90.7 89.9 89.2 88.7  PLT 339 311 315 335   Basic Metabolic Panel: Recent Labs  Lab 08/31/21 1748 09/01/21 0500 09/02/21 0419 09/03/21 0842  NA 132* 135 134* 132*  K 4.0 3.8 3.9 3.8  CL 97* 105 105 102  CO2 25 23 23  21*  GLUCOSE 111* 114* 86 106*  BUN 19 14 11 9   CREATININE 1.10 0.95 0.79 1.15  CALCIUM 9.1 8.3* 8.6* 8.5*  MG  --   --  2.1 2.0   GFR: Estimated Creatinine Clearance: 55 mL/min (by C-G formula based on SCr of 1.15 mg/dL). Liver Function Tests: Recent Labs  Lab 08/31/21 1748 09/01/21 0500  AST 37 31  ALT 52* 43  ALKPHOS 106 84  BILITOT 0.8 0.9  PROT 8.2* 6.8  ALBUMIN 3.2* 2.5*   Recent Labs  Lab 08/31/21 1748  LIPASE 44   No results for input(s): AMMONIA in the last 168 hours. Coagulation Profile: Recent Labs  Lab  08/31/21 1748  INR 1.0   Cardiac Enzymes: No results for input(s): CKTOTAL, CKMB, CKMBINDEX, TROPONINI in the last 168 hours. BNP (last 3 results) No results for input(s): PROBNP in the last 8760 hours. HbA1C: No results for input(s): HGBA1C in the last 72 hours.  CBG: Recent Labs  Lab 09/01/21 2206 09/02/21 0909 09/02/21 1250 09/02/21 1723 09/02/21 2128  GLUCAP 84 104* 129* 95 105*   Lipid Profile: No results for input(s): CHOL, HDL, LDLCALC, TRIG, CHOLHDL, LDLDIRECT in the last 72 hours. Thyroid Function Tests: No results for input(s): TSH, T4TOTAL, FREET4, T3FREE, THYROIDAB in the last 72 hours. Anemia Panel: No results for input(s): VITAMINB12, FOLATE, FERRITIN, TIBC, IRON, RETICCTPCT in the last 72 hours. Sepsis Labs: Recent Labs  Lab 08/31/21 1810  LATICACIDVEN 1.3    Recent Results (from the past 240 hour(s))  Resp Panel by RT-PCR (Flu A&B, Covid) Nasopharyngeal Swab     Status: None   Collection Time: 08/31/21  6:10 PM   Specimen: Nasopharyngeal Swab; Nasopharyngeal(NP) swabs in vial transport medium  Result Value Ref Range Status   SARS Coronavirus 2 by RT PCR NEGATIVE NEGATIVE Final    Comment: (NOTE) SARS-CoV-2 target nucleic acids are NOT DETECTED.  The SARS-CoV-2 RNA is generally detectable in upper respiratory specimens during the acute phase of infection. The lowest concentration of SARS-CoV-2 viral copies this assay can detect is 138 copies/mL. A negative result does not preclude SARS-Cov-2 infection and should not be used as the sole basis for treatment or other patient management decisions. A negative result may occur with  improper specimen collection/handling, submission of specimen other than nasopharyngeal swab, presence of viral mutation(s) within the areas targeted by this assay, and inadequate number of viral copies(<138 copies/mL). A negative result must be combined with clinical observations, patient history, and  epidemiological information. The expected result is Negative.  Fact Sheet for Patients:  09/02/21  Fact Sheet for Healthcare Providers:  09/02/21  This test is no t yet approved or cleared by the [employer] FDA and  has been authorized for detection and/or diagnosis of SARS-CoV-2 by FDA under an Emergency Use Authorization (EUA). This EUA will remain  in effect (meaning this test can be used) for the duration of the COVID-19 declaration under Section 564(b)(1) of the Act, 21 U.S.C.section 360bbb-3(b)(1), unless the authorization is terminated  or revoked sooner.       Influenza A by PCR NEGATIVE NEGATIVE Final   Influenza B by PCR NEGATIVE NEGATIVE Final    Comment: (NOTE) The Xpert Xpress SARS-CoV-2/FLU/RSV plus assay is intended as an aid in the diagnosis of influenza from Nasopharyngeal swab specimens and should not be used as a sole basis for treatment. Nasal washings and aspirates are unacceptable for Xpert Xpress SARS-CoV-2/FLU/RSV testing.  Fact Sheet for Patients:  [employer]  Fact Sheet for Healthcare Providers: [employer]ious[employer]it  This test is not yet approved or cleared by the [place] FDA and has been authorized for detection and/or diagnosis of SARS-CoV-2 by FDA under an Emergency Use Authorization (EUA). This EUA will remain in effect (meaning this test can be used) for the duration of the COVID-19 declaration under Section 564(b)(1) of the Act, 21 U.S.C. section 360bbb-3(b)(1), unless the authorization is terminated or revoked.  Performed at [facility], [address] [place], [place] [address]   Aerobic/Anaerobic Culture w Gram Stain (surgical/deep wound)     Status: None (Preliminary result)   Collection Time: 09/01/21  2:10 PM   Specimen: Wound; Abscess  Result Value Ref Range Status   Specimen Description    Final    WOUND Performed at [facility], [address] [place], [place] [address]    Special Requests   Final    [facility] ABSCESS Performed at [facility], [address] [place], [place] [address]    Gram Stain   Final    ABUNDANT WBC PRESENT, PREDOMINANTLY MONONUCLEAR FEW GRAM NEGATIVE RODS    Culture   Final    MODERATE ENTEROBACTER AEROGENES RARE GRAM NEGATIVE RODS RARE PSEUDOMONAS AERUGINOSA IDENTIFICATION AND SUSCEPTIBILITIES TO FOLLOW CULTURE REINCUBATED FOR BETTER GROWTH HOLDING FOR POSSIBLE ANAEROBE Performed at [facility], [address], [place], [place] [address]    Report Status PENDING  Incomplete      Radiology Studies: No results found.   Scheduled Meds:  amLODipine  5 mg Oral BID   atorvastatin  80 mg Oral QHS   irbesartan  300 mg Oral Daily   And   hydrochlorothiazide  12.5 mg Oral Daily   HYDROcodone bit-homatropine  5 mL Oral QHS   isosorbide mononitrate  60 mg Oral Daily   metoprolol succinate  12.5 mg Oral Daily   propylthiouracil  25 mg Oral BID   sodium chloride flush  3 mL Intravenous Q12H   sodium chloride flush  5 mL Intracatheter Q8H   Continuous Infusions:  heparin 1,700 Units/hr (09/03/21 2026)   piperacillin-tazobactam (ZOSYN)  IV 3.375 g (09/03/21 1601)     LOS: 3 days     Darlin Priestly, MD [facility] If 7PM-7AM, please contact night-coverage 09/03/2021, 8:57 PM

## 2021-09-04 ENCOUNTER — Encounter: Payer: Self-pay | Admitting: Internal Medicine

## 2021-09-04 ENCOUNTER — Inpatient Hospital Stay: Payer: Medicare Other

## 2021-09-04 DIAGNOSIS — K5720 Diverticulitis of large intestine with perforation and abscess without bleeding: Secondary | ICD-10-CM | POA: Diagnosis not present

## 2021-09-04 DIAGNOSIS — K651 Peritoneal abscess: Secondary | ICD-10-CM | POA: Diagnosis not present

## 2021-09-04 LAB — BASIC METABOLIC PANEL
Anion gap: 6 (ref 5–15)
BUN: 10 mg/dL (ref 8–23)
CO2: 26 mmol/L (ref 22–32)
Calcium: 8.8 mg/dL — ABNORMAL LOW (ref 8.9–10.3)
Chloride: 98 mmol/L (ref 98–111)
Creatinine, Ser: 1.23 mg/dL (ref 0.61–1.24)
GFR, Estimated: >60 mL/min (ref >60)
Glucose, Bld: 113 mg/dL — ABNORMAL HIGH (ref 70–99)
Potassium: 3.6 mmol/L (ref 3.5–5.1)
Sodium: 130 mmol/L — ABNORMAL LOW (ref 135–145)

## 2021-09-04 LAB — CBC
HCT: 40.2 % (ref 39.0–52.0)
Hemoglobin: 13.3 g/dL (ref 13.0–17.0)
MCH: 29.4 pg (ref 26.0–34.0)
MCHC: 33.1 g/dL (ref 30.0–36.0)
MCV: 88.7 fL (ref 80.0–100.0)
Platelets: 355 10*3/uL (ref 150–400)
RBC: 4.53 MIL/uL (ref 4.22–5.81)
RDW: 13.4 % (ref 11.5–15.5)
WBC: 16.9 10*3/uL — ABNORMAL HIGH (ref 4.0–10.5)
nRBC: 0 % (ref 0.0–0.2)

## 2021-09-04 LAB — HEPARIN LEVEL (UNFRACTIONATED)
Heparin Unfractionated: 0.77 IU/mL — ABNORMAL HIGH (ref 0.30–0.70)
Heparin Unfractionated: 0.6 IU/mL (ref 0.30–0.70)
Heparin Unfractionated: 0.64 IU/mL (ref 0.30–0.70)

## 2021-09-04 LAB — MAGNESIUM: Magnesium: 2.2 mg/dL (ref 1.7–2.4)

## 2021-09-04 MED ORDER — GUAIFENESIN-DM 100-10 MG/5ML PO SYRP
10.0000 mL | ORAL_SOLUTION | Freq: Four times a day (QID) | ORAL | Status: DC | PRN
  Administered 2021-09-04 – 2021-09-05 (×3): 10 mL via ORAL
  Filled 2021-09-04 (×4): qty 10

## 2021-09-04 MED ORDER — IOHEXOL 300 MG/ML  SOLN
100.0000 mL | Freq: Once | INTRAMUSCULAR | Status: AC | PRN
  Administered 2021-09-04: 12:00:00 100 mL via INTRAVENOUS

## 2021-09-04 MED ORDER — HYDROCODONE BIT-HOMATROP MBR 5-1.5 MG/5ML PO SOLN
5.0000 mL | Freq: Four times a day (QID) | ORAL | Status: DC | PRN
  Administered 2021-09-04 – 2021-09-08 (×12): 5 mL via ORAL
  Filled 2021-09-04 (×12): qty 5

## 2021-09-04 MED ORDER — SODIUM CHLORIDE 0.9 % IV SOLN
1.0000 g | Freq: Three times a day (TID) | INTRAVENOUS | Status: DC
  Administered 2021-09-04 – 2021-09-08 (×12): 1 g via INTRAVENOUS
  Filled 2021-09-04 (×13): qty 1

## 2021-09-04 MED ORDER — IOHEXOL 9 MG/ML PO SOLN
500.0000 mL | ORAL | Status: AC
  Administered 2021-09-04 (×2): 500 mL via ORAL

## 2021-09-04 MED ORDER — POLYETHYLENE GLYCOL 3350 17 G PO PACK
34.0000 g | PACK | Freq: Two times a day (BID) | ORAL | Status: DC
  Administered 2021-09-05 – 2021-09-08 (×5): 34 g via ORAL
  Filled 2021-09-04 (×8): qty 2

## 2021-09-04 NOTE — Progress Notes (Signed)
ANTICOAGULATION CONSULT NOTE  Pharmacy Consult for heparin post IR procedure Indication: pulmonary embolus  Patient Measurements: Height: 5\' 7"  (170.2 cm) Weight: 81.6 kg (180 lb) IBW/kg (Calculated) : 66.1 Heparin Dosing Weight: 81 kg  Labs: Recent Labs    09/02/21 0419 09/02/21 0553 09/03/21 0842 09/03/21 1756 09/04/21 0243 09/04/21 1306  HGB 13.4  --  13.8  --  13.3  --   HCT 40.6  --  41.8  --  40.2  --   PLT 315  --  335  --  355  --   HEPARINUNFRC  --    < > 0.77* 0.60 0.77* 0.60  CREATININE 0.79  --  1.15  --  1.23  --    < > = values in this interval not displayed.     Estimated Creatinine Clearance: 51.4 mL/min (by C-G formula based on SCr of 1.23 mg/dL).   Medical History: Past Medical History:  Diagnosis Date   Diverticulitis    Enlarged prostate    Gallstones    Hypertension    Hyperthyroidism      Assessment: 75 year old male with abdominal pain sent for possible PE on outpatient CT imaging done 12/22. No anticoagulation noted PTA. Pharmacy consulted for restarting heparin post IR procedure with standard bleeding risk.  Date Time HL Rate/comment 1224 0553 0.14 1300 un/hr, subtherapeutic  1224 1501 0.28 1600 un/hr, subtherapeutic 1225 0106   0.52     1750 un/hr, therapeutic x 1  1225 0842 0.77 1750 un/hr, supratherapeutic 1225 1756 0.60 1700 un/hr, therapeutic x 1 1226    0243   0.77      1700 unit/hr,  SUPRAtherapeutic   Goal of Therapy:  Heparin level 0.3-0.7 units/ml Monitor platelets by anticoagulation protocol: Yes   Plan:  12/26:  HL @ 1306 = 0.60, therapeutic x1  Continue heparin drip rate at 1600 units/hr. Will check confirmatory HL in 8 hrs  1/27, PharmD 09/04/2021 2:13 PM

## 2021-09-04 NOTE — Progress Notes (Signed)
PROGRESS NOTE    Todd PLESS Sr.  GEX:[id number] DOB: [birth year] DOA: 08/31/2021 PCP: Barbette Reichmann, MD  [phone]   Assessment & Plan:   Principal Problem:   Abdominal pain Active Problems:   TIA (transient ischemic attack)   CAD (coronary artery disease)   Benign essential hypertension   Type 2 diabetes mellitus (HCC)   Abdominopelvic abscess (HCC)   Pulmonary embolism (HCC)   Diverticulitis of large intestine with abscess without bleeding   Todd E Tsuchiya Sr. is a 75 y.o. male with hx of diverticulitis, BPH, gallstones, hypertension, hyPERthyroidism who presented to ED with complaints of abdominal pain that has been going on for nearly 2 weeks.  Pt had been treated for diverticulitis with antibiotics for the past 2 weeks    # Complicated diverticulitis with abscess S/p drain placement by IR on 12/23 --GenSurg consulted, No emergent surgical intervention at this time. --about 120 ml purulent material drained by IR, cx sent. --started on IV zosyn PLAN:  --cont IV zosyn --monitor drain output --f/u abscess cx --cont Full liquid diet  # Sepsis, POA --tachycardia and WBC 15.7, source above  # Chronic PE, POA --newly dx during this admission, likely due to his covid infection.  ordered heparin gtt on admission, held for IR procedure. --cont heparin gtt for now, per [place]  # HTN --on home amlodipine, irbesartan, HCTZ, Imdur and Toprol --cont home regimen  # HyPERthyroidism --cont home propylthiouracil  # post-COVID cough --hycodan PRN, Robitussin PRN   DVT prophylaxis: [id number] gtt Code Status: Full code  Family Communication:   Level of care: Med-Surg Dispo:   The patient is from: home Anticipated d/c is to: home Anticipated d/c date is: > 3 days Patient currently is not medically ready to d/c due to: intra-abdominal abscesses, on IV abx   Subjective and Interval History:  Due to increasing WBC, repeat CT a/p done today.    Saw pt  with Dr. Rivka Safer and noted drain output to look and smell like liquid stool.  Dr. Aleen Campi made aware.   Objective: Vitals:   09/04/21 0524 09/04/21 0856 09/04/21 1521 09/04/21 1918  BP: (!) 100/53 114/64 (!) 86/53 103/65  Pulse: 73 79 88 91  Resp: Temp: 98.2 F (36.8 C) (!) 97.4 F (36.3 C) 97.7 F (36.5 C) 98.1 F (36.7 C)  TempSrc: Oral Oral Oral Oral  SpO2: 92% 93% 91% 93%  Weight:      Height:        Intake/Output Summary (Last 24 hours) at 09/04/2021 2051 Last data filed at 09/04/2021 1752 Gross per 24 hour  Intake 1066.57 ml  Output 1190 ml  Net -123.43 ml   Filed Weights   08/31/21 1747  Weight: 81.6 kg    Examination:   Constitutional: NAD, AAOx3 HEENT: conjunctivae and lids normal, EOMI CV: No cyanosis.   RESP: normal respiratory effort, on RA GI: 2 drains over left side of abdomen draining tan viscous liquid that smelled like sulfur.   Extremities: No effusions, edema in BLE SKIN: warm, dry Neuro: II - XII grossly intact.   Psych: Normal mood and affect.  Appropriate judgement and reason   Data Reviewed: I have personally reviewed following labs and imaging studies  CBC: Recent Labs  Lab 08/31/21 1748 09/01/21 0500 09/02/21 0419 09/03/21 0842 09/04/21 0243  WBC 15.7* 14.1* 11.5* 14.5* 16.9*  HGB 14.7 12.9* 13.4 13.8 13.3  HCT 43.9 39.0 40.6 41.8 40.2  MCV 90.7 89.9 89.2 88.7 88.7  PLT 339 311 315 335 355   Basic Metabolic Panel: Recent Labs  Lab 08/31/21 1748 09/01/21 0500 09/02/21 0419 09/03/21 0842 09/04/21 0243  NA 132* 135 134* 132* 130*  K 4.0 3.8 3.9 3.8 3.6  CL 97* 105 105 102 98  CO2 25 23 23  21* 26  GLUCOSE 111* 114* 86 106* 113*  BUN 19 14 11 9 10   CREATININE 1.10 0.95 0.79 1.15 1.23  CALCIUM 9.1 8.3* 8.6* 8.5* 8.8*  MG  --   --  2.1 2.0 2.2   GFR: Estimated Creatinine Clearance: 51.4 mL/min (by C-G formula based on SCr of 1.23 mg/dL). Liver Function Tests: Recent Labs  Lab 08/31/21 1748  09/01/21 0500  AST 37 31  ALT 52* 43  ALKPHOS 106 84  BILITOT 0.8 0.9  PROT 8.2* 6.8  ALBUMIN 3.2* 2.5*   Recent Labs  Lab 08/31/21 1748  LIPASE 44   No results for input(s): AMMONIA in the last 168 hours. Coagulation Profile: Recent Labs  Lab 08/31/21 1748  INR 1.0   Cardiac Enzymes: No results for input(s): CKTOTAL, CKMB, CKMBINDEX, TROPONINI in the last 168 hours. BNP (last 3 results) No results for input(s): PROBNP in the last 8760 hours. HbA1C: No results for input(s): HGBA1C in the last 72 hours.  CBG: Recent Labs  Lab 09/01/21 2206 09/02/21 0909 09/02/21 1250 09/02/21 1723 09/02/21 2128  GLUCAP 84 104* 129* 95 105*   Lipid Profile: No results for input(s): CHOL, HDL, LDLCALC, TRIG, CHOLHDL, LDLDIRECT in the last 72 hours. Thyroid Function Tests: No results for input(s): TSH, T4TOTAL, FREET4, T3FREE, THYROIDAB in the last 72 hours. Anemia Panel: No results for input(s): VITAMINB12, FOLATE, FERRITIN, TIBC, IRON, RETICCTPCT in the last 72 hours. Sepsis Labs: Recent Labs  Lab 08/31/21 1810  LATICACIDVEN 1.3    Recent Results (from the past 240 hour(s))  Resp Panel by RT-PCR (Flu A&B, Covid) Nasopharyngeal Swab     Status: None   Collection Time: 08/31/21  6:10 PM   Specimen: Nasopharyngeal Swab; Nasopharyngeal(NP) swabs in vial transport medium  Result Value Ref Range Status   SARS Coronavirus 2 by RT PCR NEGATIVE NEGATIVE Final    Comment: (NOTE) SARS-CoV-2 target nucleic acids are NOT DETECTED.  The SARS-CoV-2 RNA is generally detectable in upper respiratory specimens during the acute phase of infection. The lowest concentration of SARS-CoV-2 viral copies this assay can detect is 138 copies/mL. A negative result does not preclude SARS-Cov-2 infection and should not be used as the sole basis for treatment or other patient management decisions. A negative result may occur with  improper specimen collection/handling, submission of specimen  other than nasopharyngeal swab, presence of viral mutation(s) within the areas targeted by this assay, and inadequate number of viral copies(<138 copies/mL). A negative result must be combined with clinical observations, patient history, and epidemiological information. The expected result is Negative.  Fact Sheet for Patients:  09/02/21  Fact Sheet for Healthcare Providers:  09/02/21  This test is no t yet approved or cleared by the [employer] FDA and  has been authorized for detection and/or diagnosis of SARS-CoV-2 by FDA under an Emergency Use Authorization (EUA). This EUA will remain  in effect (meaning this test can be used) for the duration of the COVID-19 declaration under Section 564(b)(1) of the Act, 21 U.S.C.section 360bbb-3(b)(1), unless the authorization is terminated  or revoked sooner.       Influenza A by PCR NEGATIVE NEGATIVE Final   Influenza B by PCR NEGATIVE NEGATIVE  Final    Comment: (NOTE) The Xpert Xpress SARS-CoV-2/FLU/RSV plus assay is intended as an aid in the diagnosis of influenza from Nasopharyngeal swab specimens and should not be used as a sole basis for treatment. Nasal washings and aspirates are unacceptable for Xpert Xpress SARS-CoV-2/FLU/RSV testing.  Fact Sheet for Patients: [employer]  Fact Sheet for Healthcare Providers: [employer]  This test is not yet approved or cleared by the [place] FDA and has been authorized for detection and/or diagnosis of SARS-CoV-2 by FDA under an Emergency Use Authorization (EUA). This EUA will remain in effect (meaning this test can be used) for the duration of the COVID-19 declaration under Section 564(b)(1) of the Act, 21 U.S.C. section 360bbb-3(b)(1), unless the authorization is terminated or revoked.  Performed at [facility] [address]  [place], [place] [address]   Aerobic/Anaerobic Culture w Gram Stain (surgical/deep wound)     Status: None (Preliminary result)   Collection Time: 09/01/21  2:10 PM   Specimen: Wound; Abscess  Result Value Ref Range Status   Specimen Description   Final    WOUND Performed at [facility], [address] [place], [place] [address]    Special Requests   Final    [facility] ABSCESS Performed at [facility], [address] [place], [place] [address]    Gram Stain   Final    ABUNDANT WBC PRESENT, PREDOMINANTLY MONONUCLEAR FEW GRAM NEGATIVE RODS    Culture   Final    MODERATE ENTEROBACTER AEROGENES RARE ESCHERICHIA COLI RARE PSEUDOMONAS AERUGINOSA HOLDING FOR POSSIBLE ANAEROBE SUSCEPTIBILITIES TO FOLLOW Performed at [facility], [address] [place], [place] [address]    Report Status PENDING  Incomplete   Organism ID, Bacteria ENTEROBACTER AEROGENES  Final   Organism ID, Bacteria ESCHERICHIA COLI  Final      Susceptibility   Enterobacter aerogenes - MIC*    CEFAZOLIN >=64 RESISTANT Resistant     CEFEPIME 0.5 SENSITIVE Sensitive     CEFTAZIDIME >=64 RESISTANT Resistant     CEFTRIAXONE >=64 RESISTANT Resistant     CIPROFLOXACIN <=0.25 SENSITIVE Sensitive     GENTAMICIN <=1 SENSITIVE Sensitive     IMIPENEM <=0.25 SENSITIVE Sensitive     TRIMETH/SULFA <=20 SENSITIVE Sensitive     PIP/TAZO >=128 RESISTANT Resistant     * MODERATE ENTEROBACTER AEROGENES   Escherichia coli - MIC*    AMPICILLIN 4 SENSITIVE Sensitive     CEFAZOLIN <=4 SENSITIVE Sensitive     CEFEPIME <=0.12 SENSITIVE Sensitive     CEFTAZIDIME <=1 SENSITIVE Sensitive     CEFTRIAXONE <=0.25 SENSITIVE Sensitive     CIPROFLOXACIN <=0.25 SENSITIVE Sensitive     GENTAMICIN <=1 SENSITIVE Sensitive     IMIPENEM <=0.25 SENSITIVE Sensitive     TRIMETH/SULFA <=20 SENSITIVE Sensitive     AMPICILLIN/SULBACTAM <=2 SENSITIVE Sensitive     PIP/TAZO <=4 SENSITIVE Sensitive     * RARE ESCHERICHIA COLI       Radiology Studies: CT ABDOMEN PELVIS W CONTRAST  Result Date: 09/04/2021 CLINICAL DATA:  Abscess diverticular abscess, post percutaneous drainage catheter placement x2 on 09/01/2021. EXAM: CT ABDOMEN AND PELVIS WITH CONTRAST TECHNIQUE: Multidetector CT imaging of the abdomen and pelvis was performed using the standard protocol following bolus administration of intravenous contrast. CONTRAST:  OMNIPAQUE IOHEXOL 300 MG/ML  SOLN COMPARISON:  CT abdomen pelvis-08/31/2021; CT-guided per drainage catheter placement x2-09/01/2021 FINDINGS: Lower chest: Limited visualization of the lower thorax demonstrates minimal dependent subpleural ground-glass atelectasis, most conspicuous  within the left lower lobe. Normal heart size.  No pericardial effusion. Hepatobiliary: Normal hepatic contour. There is diffuse decreased attenuation of the hepatic parenchyma suggestive of hepatic steatosis. There is a punctate (approximately 0.6 cm) hypoattenuating lesion within the dome of the right lobe of the liver (image 15, series 2, which is too small to adequately characterize though favored to represent a hepatic cyst. Normal appearance of the gallbladder given degree distention. No radiopaque gallstones. No intra or extrahepatic biliary duct dilatation. No ascites. Pancreas: Normal appearance of the pancreas. Spleen: Normal appearance of the spleen. Adrenals/Urinary Tract: There is symmetric enhancement and excretion of the bilateral kidneys. No evidence of nephrolithiasis on this postcontrast examination. Note is made of an approximately 3.1 cm hypoattenuating left-sided renal cyst (image 29, series 7). Additional subcentimeter hypoattenuating renal lesions are too small to accurately characterize though favored to represent additional renal cysts. Normal appearance of the bilateral adrenal glands. Normal appearance of the urinary bladder given degree of distention. Stomach/Bowel: Stable positioning of both left-sided  percutaneous drainage catheters. There is near complete resolution of previously noted dominant interloop abscess within the left lower abdomen/pelvis as the smaller pericolonic abscess adjacent to the distal descending colon following percutaneous drainage catheter placement. A moderate amount of residual mesenteric stranding persists. Scattered adjacent foci of subcutaneous air may represent the sequela of drainage catheter flushing. There is wall thickening involving the mesenteric portion of an adjacent loop of small bowel (image 52, series 2), with adjacent small foci of subcutaneous emphysema. No new definable/drainable fluid collection within the abdomen or pelvis. Redemonstrated extensive colonic diverticulosis. Ingested enteric contrast extends to the level of the rectum. Large colonic stool burden without evidence of enteric obstruction. No hiatal hernia. No pneumatosis or portal venous gas. Vascular/Lymphatic: Moderate to large amount of irregular mixed calcified and noncalcified atherosclerotic plaque within a mildly tortuous but normal caliber abdominal aorta. The major branch vessels of the abdominal aorta appear patent on this non CTA examination. No bulky retroperitoneal, mesenteric, pelvic or inguinal lymphadenopathy. Reproductive: Redemonstrated distension of the prosthetic portion of the urethra. Otherwise, normal appearance the prostate gland. No free fluid the pelvic cul-de-sac. Other: Minimal amount of subcutaneous edema about the left lateral abdominal wall. Musculoskeletal: No acute or aggressive osseous abnormalities. 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 Mild-to-moderate degenerative change the bilateral hips with joint space loss, subchondral sclerosis and osteophytosis. Note is made of a small os acetabuli bilaterally. IMPRESSION: 1. Interval  reduction/resolution of dominant interloop abscess and pericolonic abscess following image guided percutaneous drainage catheter placement. While a moderate amount of residual mesenteric stranding persists, there are no new definable/drainable fluid collections within the abdomen or pelvis. 2. Redemonstrated wall thickening involving the mesenteric surface of an adjacent loop of small bowel with an adjacent foci of subcutaneous emphysema - nonspecific though could represent a concomitant area of bowel perforation (in addition to the suspected diverticular abscess arising from the anterior surface of the descending colon). 3. Extensive colonic diverticulosis. 4. Large colonic stool burden without evidence of enteric obstruction. 5. Suspected hepatic steatosis.  Correlation with LFTs is advised. 6.  Aortic Atherosclerosis (ICD10-I70.0). PLAN: - Given the amount of persistent mesenteric stranding, do not recommend drainage catheter removal at this time. - Follow-up CT scan of the abdomen and pelvis in 1 week (January 2nd) followed by potential drainage catheter injection is advised. - Maintain continued flushing of the drainage  catheter with 10 cc normal saline twice per day. - Recommend continued diligent monitoring and recording of daily drainage catheter output. Electronically Signed   By: Simonne Come M.D.   On: 09/04/2021 13:50     Scheduled Meds:  atorvastatin  80 mg Oral QHS   isosorbide mononitrate  60 mg Oral Daily   metoprolol succinate  12.5 mg Oral Daily   polyethylene glycol  34 g Oral BID   propylthiouracil  25 mg Oral BID   sodium chloride flush  3 mL Intravenous Q12H   sodium chloride flush  5 mL Intracatheter Q8H   Continuous Infusions:  heparin 1,600 Units/hr (09/04/21 1752)   meropenem (MERREM) IV Stopped (09/04/21 1727)     LOS: 4 days     Darlin Priestly, MD [facility] If 7PM-7AM, please contact night-coverage 09/04/2021, 8:51 PM

## 2021-09-04 NOTE — Progress Notes (Signed)
ANTICOAGULATION CONSULT NOTE  Pharmacy Consult for heparin post IR procedure Indication: pulmonary embolus  Patient Measurements: Height: 5\' 7"  (170.2 cm) Weight: 81.6 kg (180 lb) IBW/kg (Calculated) : 66.1 Heparin Dosing Weight: 81 kg  Labs: Recent Labs    09/02/21 0419 09/02/21 0553 09/03/21 0842 09/03/21 1756 09/04/21 0243 09/04/21 1306 09/04/21 2106  HGB 13.4  --  13.8  --  13.3  --   --   HCT 40.6  --  41.8  --  40.2  --   --   PLT 315  --  335  --  355  --   --   HEPARINUNFRC  --    < > 0.77*   < > 0.77* 0.60 0.64  CREATININE 0.79  --  1.15  --  1.23  --   --    < > = values in this interval not displayed.     Estimated Creatinine Clearance: 51.4 mL/min (by C-G formula based on SCr of 1.23 mg/dL).   Medical History: Past Medical History:  Diagnosis Date   Diverticulitis    Enlarged prostate    Gallstones    Hypertension    Hyperthyroidism      Assessment: 75 year old male with abdominal pain sent for possible PE on outpatient CT imaging done 12/22. No anticoagulation noted PTA. Pharmacy consulted for restarting heparin post IR procedure with standard bleeding risk.  Date Time HL Rate/comment 1224 0553 0.14 1300 un/hr, subtherapeutic  1224 1501 0.28 1600 un/hr, subtherapeutic 1225 0106   0.52     1750 un/hr, therapeutic x 1  1225 0842 0.77 1750 un/hr, supratherapeutic 1225 1756 0.60 1700 un/hr, therapeutic x 1 1226    0243   0.77      1700 unit/hr, supratherapeutic  1226    1306   0.60      1600 unit/hr, therapeutic x 1 1226    2106   0.64      1600 unit/hr, therapeutic x 2  Goal of Therapy:  Heparin level 0.3-0.7 units/ml Monitor platelets by anticoagulation protocol: Yes   Plan:  12/26:  HL @ 2106 = 0.64, therapeutic x2  Continue heparin drip rate at 1600 units/hr. Will check HL with AM labs, CBC daily while on Heparin drip.  2107, PharmD, BCPS 09/04/2021 9:32 PM

## 2021-09-04 NOTE — Progress Notes (Signed)
Pharmacy Antibiotic Note  Todd RAUCH Sr. is a 75 y.o. male admitted on 08/31/2021 with  diverticular abscess  s/p IR drain placement 12/23.  Pharmacy has been consulted for meropenem dosing.  12/23 diverticular abscess cx:  E aerogenes (Resistant to piperacillin/tazobactam) E coli (Pan-susc) Pseudomonas aeruginosa (susc pending) Possible anaerobe  Plan: Meropenem 1gm IV q8h  Follow-up final cultures and susc) Follow-up renal function, CrCl is borderline for needing dose adjustment  Height: 5\' 7"  (170.2 cm) Weight: 81.6 kg (180 lb) IBW/kg (Calculated) : 66.1  Temp (24hrs), Avg:97.9 F (36.6 C), Min:97.4 F (36.3 C), Max:98.2 F (36.8 C)  Recent Labs  Lab 08/31/21 1748 08/31/21 1810 09/01/21 0500 09/02/21 0419 09/03/21 0842 09/04/21 0243  WBC 15.7*  --  14.1* 11.5* 14.5* 16.9*  CREATININE 1.10  --  0.95 0.79 1.15 1.23  LATICACIDVEN  --  1.3  --   --   --   --     Estimated Creatinine Clearance: 51.4 mL/min (by C-G formula based on SCr of 1.23 mg/dL).    Allergies  Allergen Reactions   Ciprofloxacin Rash    Antimicrobials this admission: 12/22 CRO/flagyl x1 12/22 Zosyn >> 12/26 12/26 meropenem >>  Dose adjustments this admission:   Microbiology results: 12/23 diveriticular abscess cx:  E aerogenes (Resistant to piperacillin/tazobactam) E coli (Pan-susc) Pseudomonas aeruginosa (susc pending) Possible anaerobe  Thank you for allowing pharmacy to be a part of this patients care.  1/24, PharmD, BCPS, BCIDP Work Cell: [phone] 09/04/2021 4:23 PM

## 2021-09-04 NOTE — Progress Notes (Signed)
ANTICOAGULATION CONSULT NOTE  Pharmacy Consult for heparin post IR procedure Indication: pulmonary embolus  Patient Measurements: Height: 5\' 7"  (170.2 cm) Weight: 81.6 kg (180 lb) IBW/kg (Calculated) : 66.1 Heparin Dosing Weight: 81 kg  Labs: Recent Labs    09/02/21 0419 09/02/21 0553 09/03/21 0842 09/03/21 1756 09/04/21 0243  HGB 13.4  --  13.8  --  13.3  HCT 40.6  --  41.8  --  40.2  PLT 315  --  335  --  355  HEPARINUNFRC  --    < > 0.77* 0.60 0.77*  CREATININE 0.79  --  1.15  --  1.23   < > = values in this interval not displayed.     Estimated Creatinine Clearance: 51.4 mL/min (by C-G formula based on SCr of 1.23 mg/dL).   Medical History: Past Medical History:  Diagnosis Date   Diverticulitis    Enlarged prostate    Gallstones    Hypertension    Hyperthyroidism      Assessment: 75 year old male with abdominal pain sent for possible PE on outpatient CT imaging done 12/22. No anticoagulation noted PTA. Pharmacy consulted for restarting heparin post IR procedure with standard bleeding risk.  Date Time HL Rate/comment 1224 0553 0.14 1300 un/hr, subtherapeutic  1224 1501 0.28 1600 un/hr, subtherapeutic 1225 0106   0.52     1750 un/hr, therapeutic x 1  1225 0842 0.77 1750 un/hr, supratherapeutic 1225 1756 0.60 1700 un/hr, therapeutic x 1 1226    0243   0.77      1700 unit/hr,  SUPRAtherapeutic   Goal of Therapy:  Heparin level 0.3-0.7 units/ml Monitor platelets by anticoagulation protocol: Yes   Plan:  12/26:  HL @ 0243 = 0.77, SUPRAtherapeutic  Will decrease heparin drip rate to 1600 units/hr. Will recheck HL 8 hrs after rate change.   Cruz Devilla D, PharmD 09/04/2021 4:11 AM

## 2021-09-04 NOTE — Care Management Important Message (Signed)
Important Message  Patient Details  Name: Todd GARRAWAY Sr. MRN: [id number] Date of Birth: [birth year]   Medicare Important Message Given:  Yes     [name] 09/04/2021, 12:51 PM

## 2021-09-04 NOTE — Progress Notes (Signed)
09/04/2021  Subjective: No acute events overnight.  Patient denies any significant abdominal pain and reports only discomfort where the drain was inserted through the skin.  Denies any nausea and has been tolerating a full liquid diet that was advanced yesterday.  However the only concern is that his WBC has been trending up and it was 14.5 yesterday and 16.9 today.  Vital signs: Temp:  [97.4 F (36.3 C)-98.2 F (36.8 C)] 97.4 F (36.3 C) (12/26 0856) Pulse Rate:  [73-95] 79 (12/26 0856) Resp:  [16-20] 20 (12/26 0856) BP: (100-114)/(53-70) 114/64 (12/26 0856) SpO2:  [92 %-94 %] 93 % (12/26 0856)   Intake/Output: 12/25 0701 - 12/26 0700 In: 496 [I.V.:496] Out: 670 [Urine:600; Drains:70] Last BM Date: 09/02/21  Physical Exam: Constitutional: No acute distress Abdomen: Soft, nondistended, with only some discomfort to palpation where the drains insert in the abdomen but otherwise no tenderness in the left lower quadrant or pelvis compared to prior.  2 drains in place with seropurulent fluid in the bags.  Labs:  Recent Labs    09/03/21 0842 09/04/21 0243  WBC 14.5* 16.9*  HGB 13.8 13.3  HCT 41.8 40.2  PLT 335 355   Recent Labs    09/03/21 0842 09/04/21 0243  NA 132* 130*  K 3.8 3.6  CL 102 98  CO2 21* 26  GLUCOSE 106* 113*  BUN 9 10  CREATININE 1.15 1.23  CALCIUM 8.5* 8.8*   No results for input(s): LABPROT, INR in the last 72 hours.  Imaging: No results found.  Assessment/Plan: This is a 75 y.o. male with acute diverticulitis with large intra-abdominal abscess.  - Clinically, the patient is feeling well without any worsening pain and has not been having any fevers either.  He does report a cough that he has had for a while and is not new today.  However his WBC continues to trend upwards and is 16.9 today. - Discussed with Dr. Claudine Mouton and will repeat CT scan of his abdomen pelvis today to evaluate the intra-abdominal abscesses and to see if there is need for any  further drains or manipulation of the currently existing drains. - Okay for now to continue full liquid diet and heparin drip but both may need to be paused if any other procedures are needed. - We will continue to follow along with you.   I spent 25 minutes dedicated to the care of this patient on the date of this encounter to include pre-visit review of records, face-to-face time with the patient discussing diagnosis and management, and any post-visit coordination of care.  Todd Ill, MD [facility]

## 2021-09-04 NOTE — Consult Note (Signed)
NAME: Todd BABULA Sr.  DOB: 12-03-1945  MRN: [id number]  Date/Time: 09/04/2021 6:38 PM  REQUESTING PROVIDER: Dr.Lai Subjective:  REASON FOR CONSULT: Perforated Diverticulitis ? Todd E Gleed Sr. is a 75 y.o. male with a history of HTN, hyperthyroidism presented to the ED on 08/31/21 with left lower abdominal pain of 2 weeks . HE has ah/o diverticulosis  so his PCP gave him augmentin. There was no improvement with Augmentin . He had nausea- A CT was done on 12/22 as OP by his PCP and it showed a large abdominal abscess and intramural abscess secondary to perforated diverticulitis. Ihe was sent to ED Vitals  139/76, Temp 97.9, PR 75, RR 27, sats 94% WBC 15.7, HB 14.7 and PLT 339 Cr 1.10 CTA showed Tiny subsegmental left lower lobe pulmonary emboli. Chronic right lower lobe pulmonary embolus. No right heart strain. Blood culture sent, HE was starte don zosyn- had a Drain placed by IR on 09/01/21 Culture is growing enterobacter aerogenes, e.coli and pseudomonas  H/o COVID early this year followed by chronic cough.   Past Medical History:  Diagnosis Date   Diverticulitis    Enlarged prostate    Gallstones    Hypertension    Hyperthyroidism     Past Surgical History:  Procedure Laterality Date   LEFT HEART CATH AND CORONARY ANGIOGRAPHY Left 08/03/2019   Procedure: LEFT HEART CATH AND CORONARY ANGIOGRAPHY;  Surgeon: Yolonda Kida, MD;  Location: [facility];  Service: Cardiovascular;  Laterality: Left;   MEDIAL PARTIAL KNEE REPLACEMENT Left    2018   SHOULDER SURGERY Right 2016    Social History   Socioeconomic History   Marital status: Married    Spouse name: Leda Gauze    Number of children: 2   Years of education: Not on file   Highest education level: 12th grade  Occupational History   Not on file  Tobacco Use   Smoking status: Former    Types: Cigarettes    Quit date: 1970    Years since quitting: 53.0   Smokeless tobacco: Never  Substance and  Sexual Activity   Alcohol use: Yes    Comment: Social   Drug use: Never   Sexual activity: Not on file  Other Topics Concern   Not on file  Social History Narrative   Not on file   Social Determinants of Health   Financial Resource Strain: Not on file  Food Insecurity: Not on file  Transportation Needs: Not on file  Physical Activity: Not on file  Stress: Not on file  Social Connections: Not on file  Intimate Partner Violence: Not on file    Family History  Problem Relation Age of Onset   Heart disease Mother    Heart disease Father    Allergies  Allergen Reactions   Ciprofloxacin Rash   I? Current Facility-Administered Medications  Medication Dose Route Frequency Provider Last Rate Last Admin   acetaminophen (TYLENOL) tablet 650 mg  650 mg Oral Q6H PRN Para Skeans, MD   650 mg at 08/31/21 2202   Or   acetaminophen (TYLENOL) suppository 650 mg  650 mg Rectal Q6H PRN Para Skeans, MD       atorvastatin (LIPITOR) tablet 80 mg  80 mg Oral QHS Para Skeans, MD   80 mg at 09/03/21 2211   fentaNYL (SUBLIMAZE) injection   Intravenous PRN Sandi Mariscal, MD   50 mcg at 09/01/21 1330   guaiFENesin-dextromethorphan (ROBITUSSIN DM) 100-10 MG/5ML syrup 10  mL  10 mL Oral Q6H PRN Enzo Bi, MD   10 mL at 09/04/21 1017   heparin ADULT infusion 100 units/mL (25000 units/251mL)  1,600 Units/hr Intravenous Continuous Enzo Bi, MD 16 mL/hr at 09/04/21 1752 1,600 Units/hr at 09/04/21 1752   HYDROcodone bit-homatropine (HYCODAN) 5-1.5 MG/5ML syrup 5 mL  5 mL Oral Q6H PRN Enzo Bi, MD   5 mL at 09/04/21 1653   isosorbide mononitrate (IMDUR) 24 hr tablet 60 mg  60 mg Oral Daily Para Skeans, MD   60 mg at 09/04/21 1020   meropenem (MERREM) 1 g in sodium chloride 0.9 % 100 mL IVPB  1 g Intravenous Q8H Berton Mount, RPH   Stopped at 09/04/21 1727   metoprolol succinate (TOPROL-XL) 24 hr tablet 12.5 mg  12.5 mg Oral Daily Florina Ou V, MD   12.5 mg at 09/03/21 2211   midazolam (VERSED) 5  MG/5ML injection   Intravenous PRN Sandi Mariscal, MD   1 mg at 09/01/21 1330   morphine 2 MG/ML injection 2 mg  2 mg Intravenous Q3H PRN Sharion Settler, NP       oxyCODONE (Oxy IR/ROXICODONE) immediate release tablet 5 mg  5 mg Oral Q6H PRN Sharion Settler, NP   5 mg at 09/04/21 1020   polyethylene glycol (MIRALAX / GLYCOLAX) packet 34 g  34 g Oral BID Enzo Bi, MD       propylthiouracil (PTU) tablet 25 mg  25 mg Oral BID Florina Ou V, MD   25 mg at 09/04/21 1019   sodium chloride flush (NS) 0.9 % injection 3 mL  3 mL Intravenous Q12H Florina Ou V, MD   3 mL at 09/04/21 1021   sodium chloride flush (NS) 0.9 % injection 3 mL  3 mL Intravenous PRN Para Skeans, MD   3 mL at 09/03/21 1423   sodium chloride flush (NS) 0.9 % injection 5 mL  5 mL Intracatheter Lovena Neighbours, MD   5 mL at 09/04/21 1629     Abtx:  Anti-infectives (From admission, onward)    Start     Dose/Rate Route Frequency Ordered Stop   09/04/21 1800  meropenem (MERREM) 1 g in sodium chloride 0.9 % 100 mL IVPB        1 g 200 mL/hr over 30 Minutes Intravenous Every 8 hours 09/04/21 1618     09/02/21 0100  piperacillin-tazobactam (ZOSYN) IVPB 3.375 g  Status:  Discontinued        3.375 g 12.5 mL/hr over 240 Minutes Intravenous Every 8 hours 09/01/21 2158 09/04/21 1616   09/01/21 1400  piperacillin-tazobactam (ZOSYN) IVPB 3.375 g  Status:  Discontinued        3.375 g 12.5 mL/hr over 240 Minutes Intravenous Every 8 hours 09/01/21 1029 09/01/21 2158   08/31/21 2100  piperacillin-tazobactam (ZOSYN) IVPB 3.375 g  Status:  Discontinued        3.375 g 100 mL/hr over 30 Minutes Intravenous Every 6 hours 08/31/21 1948 09/01/21 1029   08/31/21 1800  cefTRIAXone (ROCEPHIN) 1 g in sodium chloride 0.9 % 100 mL IVPB        1 g 200 mL/hr over 30 Minutes Intravenous  Once 08/31/21 1758 08/31/21 1912   08/31/21 1800  metroNIDAZOLE (FLAGYL) IVPB 500 mg        500 mg 100 mL/hr over 60 Minutes Intravenous  Once 08/31/21 1758 08/31/21  1928       REVIEW OF SYSTEMS:  Const: negative fever, negative chills,  negative weight loss Eyes: negative diplopia or visual changes, negative eye pain ENT: negative coryza, negative sore throat Resp: chronic  cough, no hemoptysis, has occasional dyspnea Cards: negative for chest pain, palpitations, lower extremity edema GU: negative for frequency, dysuria and hematuria GI: abdominal pain, , constipation Skin: negative for rash and pruritus Heme: negative for easy bruising and gum/nose bleeding MS: weakness Neurolo:negative for headaches, dizziness, vertigo, memory problems  Psych: negative for feelings of anxiety, depression  Endocrine: negative for thyroid, diabetes Allergy/Immunology- cipro Objective:  VITALS:  BP (!) 86/53 (BP Location: Right Arm)    Pulse 88    Temp 97.7 F (36.5 C) (Oral)    Resp 18    Ht 5\' 7"  (1.702 m)    Wt 81.6 kg    SpO2 91%    BMI 28.19 kg/m  PHYSICAL EXAM:  General: Alert, cooperative, no distress, appears stated age.  Head: Normocephalic, without obvious abnormality, atraumatic. Eyes: Conjunctivae clear, anicteric sclerae. Pupils are equal ENT Nares normal. No drainage or sinus tenderness. Lips, mucosa, and tongue normal. No Thrush Neck: Supple, symmetrical, no adenopathy, thyroid: non tender no carotid bruit and no JVD. Back: No CVA tenderness. Lungs: Clear to auscultation bilaterally. No Wheezing or Rhonchi. No rales. Heart: Regular rate and rhythm, no murmur, rub or gallop. Abdomen: Soft, left lower quadrant- 2 drains- bag has liquid stool  Extremities: atraumatic, no cyanosis. No edema. No clubbing Skin:hypopigmented patches Lymph: Cervical, supraclavicular normal. Neurologic: Grossly non-focal Pertinent Labs Lab Results CBC    Component Value Date/Time   WBC 16.9 (H) 09/04/2021 0243   RBC 4.53 09/04/2021 0243   HGB 13.3 09/04/2021 0243   HGB 9.8 (L) 10/25/2013 1008   HCT 40.2 09/04/2021 0243   HCT 28.6 (L) 10/25/2013 1008   PLT  355 09/04/2021 0243   PLT 155 10/25/2013 1008   MCV 88.7 09/04/2021 0243   MCV 90 10/25/2013 1008   MCH 29.4 09/04/2021 0243   MCHC 33.1 09/04/2021 0243   RDW 13.4 09/04/2021 0243   RDW 13.6 10/25/2013 1008   LYMPHSABS 1.7 12/22/2015 1337   LYMPHSABS 1.3 10/25/2013 1008   MONOABS 1.0 12/22/2015 1337   MONOABS 0.7 10/25/2013 1008   EOSABS 0.4 12/22/2015 1337   EOSABS 0.1 10/25/2013 1008   BASOSABS 0.0 12/22/2015 1337   BASOSABS 0.1 10/25/2013 1008    CMP Latest Ref Rng & Units 09/04/2021 09/03/2021 09/02/2021  Glucose 70 - 99 mg/dL 473(F) 584(Y) 86  BUN 8 - 23 mg/dL 10 9 11   Creatinine 0.61 - 1.24 mg/dL 1.71 2.78 7.18  Sodium 135 - 145 mmol/L 130(L) 132(L) 134(L)  Potassium 3.5 - 5.1 mmol/L 3.6 3.8 3.9  Chloride 98 - 111 mmol/L 98 102 105  CO2 22 - 32 mmol/L 26 21(L) 23  Calcium 8.9 - 10.3 mg/dL 3.6(D) 2.5(H) 0.0(T)  Total Protein 6.5 - 8.1 g/dL - - -  Total Bilirubin 0.3 - 1.2 mg/dL - - -  Alkaline Phos 38 - 126 U/L - - -  AST 15 - 41 U/L - - -  ALT 0 - 44 U/L - - -      Microbiology: Recent Results (from the past 240 hour(s))  Resp Panel by RT-PCR (Flu A&B, Covid) Nasopharyngeal Swab     Status: None   Collection Time: 08/31/21  6:10 PM   Specimen: Nasopharyngeal Swab; Nasopharyngeal(NP) swabs in vial transport medium  Result Value Ref Range Status   SARS Coronavirus 2 by RT PCR NEGATIVE NEGATIVE Final    Comment: (  NOTE) SARS-CoV-2 target nucleic acids are NOT DETECTED.  The SARS-CoV-2 RNA is generally detectable in upper respiratory specimens during the acute phase of infection. The lowest concentration of SARS-CoV-2 viral copies this assay can detect is 138 copies/mL. A negative result does not preclude SARS-Cov-2 infection and should not be used as the sole basis for treatment or other patient management decisions. A negative result may occur with  improper specimen collection/handling, submission of specimen other than nasopharyngeal swab, presence of viral  mutation(s) within the areas targeted by this assay, and inadequate number of viral copies(<138 copies/mL). A negative result must be combined with clinical observations, patient history, and epidemiological information. The expected result is Negative.  Fact Sheet for Patients:  EntrepreneurPulse.com.au  Fact Sheet for Healthcare Providers:  IncredibleEmployment.be  This test is no t yet approved or cleared by the [place] FDA and  has been authorized for detection and/or diagnosis of SARS-CoV-2 by FDA under an Emergency Use Authorization (EUA). This EUA will remain  in effect (meaning this test can be used) for the duration of the COVID-19 declaration under Section 564(b)(1) of the Act, 21 U.S.C.section 360bbb-3(b)(1), unless the authorization is terminated  or revoked sooner.       Influenza A by PCR NEGATIVE NEGATIVE Final   Influenza B by PCR NEGATIVE NEGATIVE Final    Comment: (NOTE) The Xpert Xpress SARS-CoV-2/FLU/RSV plus assay is intended as an aid in the diagnosis of influenza from Nasopharyngeal swab specimens and should not be used as a sole basis for treatment. Nasal washings and aspirates are unacceptable for Xpert Xpress SARS-CoV-2/FLU/RSV testing.  Fact Sheet for Patients: EntrepreneurPulse.com.au  Fact Sheet for Healthcare Providers: IncredibleEmployment.be  This test is not yet approved or cleared by the [place] FDA and has been authorized for detection and/or diagnosis of SARS-CoV-2 by FDA under an Emergency Use Authorization (EUA). This EUA will remain in effect (meaning this test can be used) for the duration of the COVID-19 declaration under Section 564(b)(1) of the Act, 21 U.S.C. section 360bbb-3(b)(1), unless the authorization is terminated or revoked.  Performed at [facility], [address] [place], [place] [address]   Aerobic/Anaerobic Culture w Gram  Stain (surgical/deep wound)     Status: None (Preliminary result)   Collection Time: 09/01/21  2:10 PM   Specimen: Wound; Abscess  Result Value Ref Range Status   Specimen Description   Final    WOUND Performed at [facility], [address] [place], [place] [address]    Special Requests   Final    [facility] ABSCESS Performed at [facility], [address] [place], [place] [address]    Gram Stain   Final    ABUNDANT WBC PRESENT, PREDOMINANTLY MONONUCLEAR FEW GRAM NEGATIVE RODS    Culture   Final    MODERATE ENTEROBACTER AEROGENES RARE ESCHERICHIA COLI RARE PSEUDOMONAS AERUGINOSA HOLDING FOR POSSIBLE ANAEROBE SUSCEPTIBILITIES TO FOLLOW Performed at [facility], [place] [address], [place], [place] [address]    Report Status PENDING  Incomplete   Organism ID, Bacteria ENTEROBACTER AEROGENES  Final   Organism ID, Bacteria ESCHERICHIA COLI  Final      Susceptibility   Enterobacter aerogenes - MIC*    CEFAZOLIN >=64 RESISTANT Resistant     CEFEPIME 0.5 SENSITIVE Sensitive     CEFTAZIDIME >=64 RESISTANT Resistant     CEFTRIAXONE >=64 RESISTANT Resistant     CIPROFLOXACIN <=0.25 SENSITIVE Sensitive     GENTAMICIN <=1 SENSITIVE Sensitive  IMIPENEM <=0.25 SENSITIVE Sensitive     TRIMETH/SULFA <=20 SENSITIVE Sensitive     PIP/TAZO >=128 RESISTANT Resistant     * MODERATE ENTEROBACTER AEROGENES   Escherichia coli - MIC*    AMPICILLIN 4 SENSITIVE Sensitive     CEFAZOLIN <=4 SENSITIVE Sensitive     CEFEPIME <=0.12 SENSITIVE Sensitive     CEFTAZIDIME <=1 SENSITIVE Sensitive     CEFTRIAXONE <=0.25 SENSITIVE Sensitive     CIPROFLOXACIN <=0.25 SENSITIVE Sensitive     GENTAMICIN <=1 SENSITIVE Sensitive     IMIPENEM <=0.25 SENSITIVE Sensitive     TRIMETH/SULFA <=20 SENSITIVE Sensitive     AMPICILLIN/SULBACTAM <=2 SENSITIVE Sensitive     PIP/TAZO <=4 SENSITIVE Sensitive     * RARE ESCHERICHIA COLI    IMAGING RESULTS:  I have personally reviewed the  films Colon shows diverticular change without evidence of diverticulitis at the junction of the descending and sigmoid colon.A small intramural abscess is noted measuring approximately 19 mm.Adjacent to the sigmoid colon draped over by a multiple inflamed small bowel loops there is a 8.5 x 5.7 cm air-fluid collection consistent with focal abscess. Given the changes in the colon this is likely a diverticular abscess. Multiple smaller collections of air are noted interposed between this abscess and the sigmoid colon. ? Impression/Recommendation ? Intraabdominal abscess  Smaller abscesses Diverticular abscess due to  diverticular perforation S/p drain placement It is surprising that he has Pseudomonas, enterobacter aerogenes which are usually not normal clonic flora. Also has e.coli Leucocytosis As enterobacter resistant to zosyn change to meropenem Will give for 7-10 days  There is stool in the bag and very likely the drain is in the colon  HTN CAD- Imdur, metoprolol and lipitor ? Graves disease - on  PTU-  followed by Dr.Solum _ PT seen with Dr.Lai ________________________________________________ Discussed with patient, and care team Note:  This document was prepared using Dragon voice recognition software and may include unintentional dictation errors.

## 2021-09-05 DIAGNOSIS — K651 Peritoneal abscess: Secondary | ICD-10-CM | POA: Diagnosis not present

## 2021-09-05 DIAGNOSIS — K5720 Diverticulitis of large intestine with perforation and abscess without bleeding: Secondary | ICD-10-CM | POA: Diagnosis not present

## 2021-09-05 LAB — HEPARIN LEVEL (UNFRACTIONATED)
Heparin Unfractionated: 0.75 IU/mL — ABNORMAL HIGH (ref 0.30–0.70)
Heparin Unfractionated: 0.72 IU/mL — ABNORMAL HIGH (ref 0.30–0.70)

## 2021-09-05 LAB — AEROBIC/ANAEROBIC CULTURE W GRAM STAIN (SURGICAL/DEEP WOUND)

## 2021-09-05 LAB — BASIC METABOLIC PANEL
Anion gap: 10 (ref 5–15)
BUN: 11 mg/dL (ref 8–23)
CO2: 24 mmol/L (ref 22–32)
Calcium: 8.6 mg/dL — ABNORMAL LOW (ref 8.9–10.3)
Chloride: 102 mmol/L (ref 98–111)
Creatinine, Ser: 1.21 mg/dL (ref 0.61–1.24)
GFR, Estimated: >60 mL/min (ref >60)
Glucose, Bld: 124 mg/dL — ABNORMAL HIGH (ref 70–99)
Potassium: 3.5 mmol/L (ref 3.5–5.1)
Sodium: 136 mmol/L (ref 135–145)

## 2021-09-05 LAB — CBC
HCT: 39.7 % (ref 39.0–52.0)
Hemoglobin: 13 g/dL (ref 13.0–17.0)
MCH: 29.3 pg (ref 26.0–34.0)
MCHC: 32.7 g/dL (ref 30.0–36.0)
MCV: 89.4 fL (ref 80.0–100.0)
Platelets: 351 10*3/uL (ref 150–400)
RBC: 4.44 MIL/uL (ref 4.22–5.81)
RDW: 13.4 % (ref 11.5–15.5)
WBC: 14.5 10*3/uL — ABNORMAL HIGH (ref 4.0–10.5)
nRBC: 0 % (ref 0.0–0.2)

## 2021-09-05 LAB — MAGNESIUM: Magnesium: 2.1 mg/dL (ref 1.7–2.4)

## 2021-09-05 NOTE — Progress Notes (Signed)
PROGRESS NOTE    Todd PENNINGS Sr.  Todd Hansen:[id number] DOB: 1946/06/30 DOA: 08/31/2021 PCP: Barbette Reichmann, MD  [phone]   Assessment & Plan:   Principal Problem:   Abdominal pain Active Problems:   TIA (transient ischemic attack)   CAD (coronary artery disease)   Benign essential hypertension   Type 2 diabetes mellitus (HCC)   Abdominopelvic abscess (HCC)   Pulmonary embolism (HCC)   Diverticulitis of large intestine with abscess without bleeding   Todd E Alderete Sr. is a 75 y.o. male with hx of diverticulitis, BPH, gallstones, hypertension, hyPERthyroidism who presented to ED with complaints of abdominal pain that has been going on for nearly 2 weeks.  Pt had been treated for diverticulitis with antibiotics for the past 2 weeks    # Complicated diverticulitis with abscess S/p drain placement by IR on 12/23 --GenSurg consulted, No emergent surgical intervention at this time. --about 120 ml purulent material drained by IR, cx sent, grew Pseudomonas, enterobacter aerogenes which are usually not normal clonic flora, and e.coli. --There is liquid stool in the bag and very likely the drain is in the colon, GenSurg aware, recommending not disturbing the drains. --started on IV zosyn, transitioned to meropenem per ID rec PLAN:  --cont IV meropenem --monitor drain output --cont soft diet, per surgery  # Sepsis, POA --tachycardia and WBC 15.7, source above  # Chronic PE, POA --newly dx during this admission, likely due to his covid infection.  ordered heparin gtt on admission, held for IR procedure. --cont heparin gtt for now, in case pt needs procedure done, per GenSurg  # HTN --on home amlodipine, irbesartan, HCTZ, Imdur and Toprol --cont home regimen  # HyPERthyroidism --cont home propylthiouracil  # post-COVID cough --cont hycodan PRN, Robitussin PRN   DVT prophylaxis: HQ:IONGEXB gtt Code Status: Full code  Family Communication:   Level of care:  Med-Surg Dispo:   The patient is from: home Anticipated d/c is to: home Anticipated d/c date is: > 3 days Patient currently is not medically ready to d/c due to: intra-abdominal abscesses, on IV abx   Subjective and Interval History:  Cough improved with hycodan.  Having BM's.   Objective: Vitals:   09/04/21 1918 09/05/21 0426 09/05/21 0836 09/05/21 1604  BP: 103/65 97/67 99/71  (!) 93/53  Pulse: 91 71 67 80  Resp: 20 16 17 18   Temp: 98.1 F (36.7 C) 98.3 F (36.8 C) 97.6 F (36.4 C) 97.8 F (36.6 C)  TempSrc: Oral Oral Oral   SpO2: 93% 94% 93% 96%  Weight:      Height:        Intake/Output Summary (Last 24 hours) at 09/05/2021 2033 Last data filed at 09/05/2021 1800 Gross per 24 hour  Intake 1181.87 ml  Output 25 ml  Net 1156.87 ml   Filed Weights   08/31/21 1747  Weight: 81.6 kg    Examination:   Constitutional: NAD, AAOx3 HEENT: conjunctivae and lids normal, EOMI CV: No cyanosis.   RESP: normal respiratory effort, on RA GI: 2 drains from left side of abdomen draining tan viscous fluid that appeared to be liquid stool Extremities: No effusions, edema in BLE SKIN: warm, dry Neuro: II - XII grossly intact.   Psych: Normal mood and affect.  Appropriate judgement and reason   Data Reviewed: I have personally reviewed following labs and imaging studies  CBC: Recent Labs  Lab 09/01/21 0500 09/02/21 0419 09/03/21 0842 09/04/21 0243 09/05/21 0601  WBC 14.1* 11.5* 14.5* 16.9* 14.5*  HGB  12.9* 13.4 13.8 13.3 13.0  HCT 39.0 40.6 41.8 40.2 39.7  MCV 89.9 89.2 88.7 88.7 89.4  PLT 311 315 335 355 351   Basic Metabolic Panel: Recent Labs  Lab 09/01/21 0500 09/02/21 0419 09/03/21 0842 09/04/21 0243 09/05/21 0601  NA 135 134* 132* 130* 136  K 3.8 3.9 3.8 3.6 3.5  CL 105 105 102 98 102  CO2 23 23 21* 26 24  GLUCOSE 114* 86 106* 113* 124*  BUN CREATININE 0.95 0.79 1.15 1.23 1.21  CALCIUM 8.3* 8.6* 8.5* 8.8* 8.6*  MG  --  2.1 2.0 2.2  2.1   GFR: Estimated Creatinine Clearance: 52.3 mL/min (by C-G formula based on SCr of 1.21 mg/dL). Liver Function Tests: Recent Labs  Lab 08/31/21 1748 09/01/21 0500  AST 37 31  ALT 52* 43  ALKPHOS 106 84  BILITOT 0.8 0.9  PROT 8.2* 6.8  ALBUMIN 3.2* 2.5*   Recent Labs  Lab 08/31/21 1748  LIPASE 44   No results for input(s): AMMONIA in the last 168 hours. Coagulation Profile: Recent Labs  Lab 08/31/21 1748  INR 1.0   Cardiac Enzymes: No results for input(s): CKTOTAL, CKMB, CKMBINDEX, TROPONINI in the last 168 hours. BNP (last 3 results) No results for input(s): PROBNP in the last 8760 hours. HbA1C: No results for input(s): HGBA1C in the last 72 hours.  CBG: Recent Labs  Lab 09/01/21 2206 09/02/21 0909 09/02/21 1250 09/02/21 1723 09/02/21 2128  GLUCAP 84 104* 129* 95 105*   Lipid Profile: No results for input(s): CHOL, HDL, LDLCALC, TRIG, CHOLHDL, LDLDIRECT in the last 72 hours. Thyroid Function Tests: No results for input(s): TSH, T4TOTAL, FREET4, T3FREE, THYROIDAB in the last 72 hours. Anemia Panel: No results for input(s): VITAMINB12, FOLATE, FERRITIN, TIBC, IRON, RETICCTPCT in the last 72 hours. Sepsis Labs: Recent Labs  Lab 08/31/21 1810  LATICACIDVEN 1.3    Recent Results (from the past 240 hour(s))  Resp Panel by RT-PCR (Flu A&B, Covid) Nasopharyngeal Swab     Status: None   Collection Time: 08/31/21  6:10 PM   Specimen: Nasopharyngeal Swab; Nasopharyngeal(NP) swabs in vial transport medium  Result Value Ref Range Status   SARS Coronavirus 2 by RT PCR NEGATIVE NEGATIVE Final    Comment: (NOTE) SARS-CoV-2 target nucleic acids are NOT DETECTED.  The SARS-CoV-2 RNA is generally detectable in upper respiratory specimens during the acute phase of infection. The lowest concentration of SARS-CoV-2 viral copies this assay can detect is 138 copies/mL. A negative result does not preclude SARS-Cov-2 infection and should not be used as the sole basis  for treatment or other patient management decisions. A negative result may occur with  improper specimen collection/handling, submission of specimen other than nasopharyngeal swab, presence of viral mutation(s) within the areas targeted by this assay, and inadequate number of viral copies(<138 copies/mL). A negative result must be combined with clinical observations, patient history, and epidemiological information. The expected result is Negative.  Fact Sheet for Patients:  BloggerCourse.com  Fact Sheet for Healthcare Providers:  SeriousBroker.it  This test is no t yet approved or cleared by the [place] FDA and  has been authorized for detection and/or diagnosis of SARS-CoV-2 by FDA under an Emergency Use Authorization (EUA). This EUA will remain  in effect (meaning this test can be used) for the duration of the COVID-19 declaration under Section 564(b)(1) of the Act, 21 U.S.C.section 360bbb-3(b)(1), unless the authorization is terminated  or revoked sooner.  Influenza A by PCR NEGATIVE NEGATIVE Final   Influenza B by PCR NEGATIVE NEGATIVE Final    Comment: (NOTE) The Xpert Xpress SARS-CoV-2/FLU/RSV plus assay is intended as an aid in the diagnosis of influenza from Nasopharyngeal swab specimens and should not be used as a sole basis for treatment. Nasal washings and aspirates are unacceptable for Xpert Xpress SARS-CoV-2/FLU/RSV testing.  Fact Sheet for Patients: BloggerCourse.com  Fact Sheet for Healthcare Providers: SeriousBroker.it  This test is not yet approved or cleared by the [place] FDA and has been authorized for detection and/or diagnosis of SARS-CoV-2 by FDA under an Emergency Use Authorization (EUA). This EUA will remain in effect (meaning this test can be used) for the duration of the COVID-19 declaration under Section 564(b)(1) of the Act, 21  U.S.C. section 360bbb-3(b)(1), unless the authorization is terminated or revoked.  Performed at [facility], [address] [place], [place] [address]   Aerobic/Anaerobic Culture w Gram Stain (surgical/deep wound)     Status: None   Collection Time: 09/01/21  2:10 PM   Specimen: Wound; Abscess  Result Value Ref Range Status   Specimen Description   Final    WOUND Performed at [facility], [address] [place], [place] [address]    Special Requests   Final    [facility] ABSCESS Performed at [facility], [address] [place], [place] [address]    Gram Stain   Final    ABUNDANT WBC PRESENT, PREDOMINANTLY MONONUCLEAR FEW GRAM NEGATIVE RODS    Culture   Final    MODERATE ENTEROBACTER AEROGENES RARE ESCHERICHIA COLI RARE PSEUDOMONAS AERUGINOSA RARE BACTEROIDES DISTASONIS BETA LACTAMASE POSITIVE Performed at [facility], [address] [place], [place] [address]    Report Status 09/05/2021 FINAL  Final   Organism ID, Bacteria ENTEROBACTER AEROGENES  Final   Organism ID, Bacteria ESCHERICHIA COLI  Final   Organism ID, Bacteria PSEUDOMONAS AERUGINOSA  Final      Susceptibility   Enterobacter aerogenes - MIC*    CEFAZOLIN >=64 RESISTANT Resistant     CEFEPIME 0.5 SENSITIVE Sensitive     CEFTAZIDIME >=64 RESISTANT Resistant     CEFTRIAXONE >=64 RESISTANT Resistant     CIPROFLOXACIN <=0.25 SENSITIVE Sensitive     GENTAMICIN <=1 SENSITIVE Sensitive     IMIPENEM <=0.25 SENSITIVE Sensitive     TRIMETH/SULFA <=20 SENSITIVE Sensitive     PIP/TAZO >=128 RESISTANT Resistant     * MODERATE ENTEROBACTER AEROGENES   Escherichia coli - MIC*    AMPICILLIN 4 SENSITIVE Sensitive     CEFAZOLIN <=4 SENSITIVE Sensitive     CEFEPIME <=0.12 SENSITIVE Sensitive     CEFTAZIDIME <=1 SENSITIVE Sensitive     CEFTRIAXONE <=0.25 SENSITIVE Sensitive     CIPROFLOXACIN <=0.25 SENSITIVE Sensitive     GENTAMICIN <=1 SENSITIVE Sensitive     IMIPENEM <=0.25  SENSITIVE Sensitive     TRIMETH/SULFA <=20 SENSITIVE Sensitive     AMPICILLIN/SULBACTAM <=2 SENSITIVE Sensitive     PIP/TAZO <=4 SENSITIVE Sensitive     * RARE ESCHERICHIA COLI   Pseudomonas aeruginosa - MIC*    CEFTAZIDIME <=1 SENSITIVE Sensitive     CIPROFLOXACIN <=0.25 SENSITIVE Sensitive     GENTAMICIN <=1 SENSITIVE Sensitive     IMIPENEM 2 SENSITIVE Sensitive     PIP/TAZO <=4 SENSITIVE Sensitive     CEFEPIME 1 SENSITIVE Sensitive     * RARE PSEUDOMONAS AERUGINOSA      Radiology Studies: CT ABDOMEN PELVIS W CONTRAST  Result Date: 09/04/2021 CLINICAL DATA:  Abscess diverticular abscess, post percutaneous drainage catheter placement x2 on 09/01/2021. EXAM: CT ABDOMEN AND PELVIS WITH CONTRAST TECHNIQUE: Multidetector CT imaging of the abdomen and pelvis was performed using the standard protocol following bolus administration of intravenous contrast. CONTRAST:  OMNIPAQUE IOHEXOL 300 MG/ML  SOLN COMPARISON:  CT abdomen pelvis-08/31/2021; CT-guided per drainage catheter placement x2-09/01/2021 FINDINGS: Lower chest: Limited visualization of the lower thorax demonstrates minimal dependent subpleural ground-glass atelectasis, most conspicuous within the left lower lobe. Normal heart size.  No pericardial effusion. Hepatobiliary: Normal hepatic contour. There is diffuse decreased attenuation of the hepatic parenchyma suggestive of hepatic steatosis. There is a punctate (approximately 0.6 cm) hypoattenuating lesion within the dome of the right lobe of the liver (image 15, series 2, which is too small to adequately characterize though favored to represent a hepatic cyst. Normal appearance of the gallbladder given degree distention. No radiopaque gallstones. No intra or extrahepatic biliary duct dilatation. No ascites. Pancreas: Normal appearance of the pancreas. Spleen: Normal appearance of the spleen. Adrenals/Urinary Tract: There is symmetric enhancement and excretion of the bilateral kidneys.  No evidence of nephrolithiasis on this postcontrast examination. Note is made of an approximately 3.1 cm hypoattenuating left-sided renal cyst (image 29, series 7). Additional subcentimeter hypoattenuating renal lesions are too small to accurately characterize though favored to represent additional renal cysts. Normal appearance of the bilateral adrenal glands. Normal appearance of the urinary bladder given degree of distention. Stomach/Bowel: Stable positioning of both left-sided percutaneous drainage catheters. There is near complete resolution of previously noted dominant interloop abscess within the left lower abdomen/pelvis as the smaller pericolonic abscess adjacent to the distal descending colon following percutaneous drainage catheter placement. A moderate amount of residual mesenteric stranding persists. Scattered adjacent foci of subcutaneous air may represent the sequela of drainage catheter flushing. There is wall thickening involving the mesenteric portion of an adjacent loop of small bowel (image 52, series 2), with adjacent small foci of subcutaneous emphysema. No new definable/drainable fluid collection within the abdomen or pelvis. Redemonstrated extensive colonic diverticulosis. Ingested enteric contrast extends to the level of the rectum. Large colonic stool burden without evidence of enteric obstruction. No hiatal hernia. No pneumatosis or portal venous gas. Vascular/Lymphatic: Moderate to large amount of irregular mixed calcified and noncalcified atherosclerotic plaque within a mildly tortuous but normal caliber abdominal aorta. The major branch vessels of the abdominal aorta appear patent on this non CTA examination. No bulky retroperitoneal, mesenteric, pelvic or inguinal lymphadenopathy. Reproductive: Redemonstrated distension of the prosthetic portion of the urethra. Otherwise, normal appearance the prostate gland. No free fluid the pelvic cul-de-sac. Other: Minimal amount of subcutaneous  edema about the left lateral abdominal wall. Musculoskeletal: No acute or aggressive osseous abnormalities. 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 Mild-to-moderate degenerative change the bilateral hips with joint space loss, subchondral sclerosis and osteophytosis. Note is made of a small os acetabuli bilaterally. IMPRESSION: 1. Interval reduction/resolution of dominant interloop abscess and pericolonic abscess following image guided percutaneous drainage catheter placement. While a moderate amount of residual mesenteric stranding persists, there are no new definable/drainable fluid collections within the abdomen or pelvis. 2. Redemonstrated wall thickening involving the mesenteric surface of an adjacent loop of small bowel with an adjacent foci of subcutaneous emphysema - nonspecific though could represent a concomitant area of bowel perforation (in addition to the suspected diverticular abscess arising from the  anterior surface of the descending colon). 3. Extensive colonic diverticulosis. 4. Large colonic stool burden without evidence of enteric obstruction. 5. Suspected hepatic steatosis.  Correlation with LFTs is advised. 6.  Aortic Atherosclerosis (ICD10-I70.0). PLAN: - Given the amount of persistent mesenteric stranding, do not recommend drainage catheter removal at this time. - Follow-up CT scan of the abdomen and pelvis in 1 week (January 2nd) followed by potential drainage catheter injection is advised. - Maintain continued flushing of the drainage catheter with 10 cc normal saline twice per day. - Recommend continued diligent monitoring and recording of daily drainage catheter output. Electronically Signed   By: Simonne Come M.D.   On: 09/04/2021 13:50     Scheduled Meds:  atorvastatin  80 mg Oral QHS   isosorbide mononitrate  60 mg Oral Daily   metoprolol  succinate  12.5 mg Oral Daily   polyethylene glycol  34 g Oral BID   propylthiouracil  25 mg Oral BID   sodium chloride flush  3 mL Intravenous Q12H   sodium chloride flush  5 mL Intracatheter Q8H   Continuous Infusions:  heparin 1,400 Units/hr (09/05/21 1831)   meropenem (MERREM) IV 1 g (09/05/21 1444)     LOS: 5 days     Darlin Priestly, MD [facility] If 7PM-7AM, please contact night-coverage 09/05/2021, 8:33 PM

## 2021-09-05 NOTE — Progress Notes (Signed)
09/05/2021  Daughter at bedside, excellent participation in discussion with overall long-term and short-term plans.  Subjective: No acute events overnight.  Patient denies any significant abdominal pain and reports only discomfort at the drain sites.  Denies any nausea and has been tolerating soft diet that was advanced yesterday.  However the only concern is that his WBC had been trending up and it is back down to 14.5 today.  He remains afebrile, reports feeling quite well.  Denying any central abdominal pain.  No nausea, no vomiting.  Vital signs: Temp:  [97.6 F (36.4 C)-98.3 F (36.8 C)] 97.6 F (36.4 C) (12/27 0836) Pulse Rate:  [67-91] 67 (12/27 0836) Resp:  [16-20] 17 (12/27 0836) BP: (86-103)/(53-71) 99/71 (12/27 0836) SpO2:  [91 %-94 %] 93 % (12/27 0836)   Intake/Output: 12/26 0701 - 12/27 0700 In: 1076.6 [P.O.:360; I.V.:351.5; IV Piggyback:345.1] Out: 565 [Urine:500; Drains:65] Last BM Date: 09/04/21  Physical Exam: Constitutional: No acute distress Abdomen: Soft, nondistended, with only some discomfort to palpation where the drains insert in the abdomen but otherwise no tenderness in the central abdomen.  2 drains in place with sero-feculent fluid in the bags.  Labs:  Recent Labs    09/04/21 0243 09/05/21 0601  WBC 16.9* 14.5*  HGB 13.3 13.0  HCT 40.2 39.7  PLT 355 351    Recent Labs    09/04/21 0243 09/05/21 0601  NA 130* 136  K 3.6 3.5  CL 98 102  CO2 26 24  GLUCOSE 113* 124*  BUN 10 11  CREATININE 1.23 1.21  CALCIUM 8.8* 8.6*      Imaging: CT ABDOMEN PELVIS W CONTRAST  Result Date: 09/04/2021 CLINICAL DATA:  Abscess diverticular abscess, post percutaneous drainage catheter placement x2 on 09/01/2021. EXAM: CT ABDOMEN AND PELVIS WITH CONTRAST TECHNIQUE: Multidetector CT imaging of the abdomen and pelvis was performed using the standard protocol following bolus administration of intravenous contrast. CONTRAST:  OMNIPAQUE IOHEXOL 300 MG/ML   SOLN COMPARISON:  CT abdomen pelvis-08/31/2021; CT-guided per drainage catheter placement x2-09/01/2021 FINDINGS: Lower chest: Limited visualization of the lower thorax demonstrates minimal dependent subpleural ground-glass atelectasis, most conspicuous within the left lower lobe. Normal heart size.  No pericardial effusion. Hepatobiliary: Normal hepatic contour. There is diffuse decreased attenuation of the hepatic parenchyma suggestive of hepatic steatosis. There is a punctate (approximately 0.6 cm) hypoattenuating lesion within the dome of the right lobe of the liver (image 15, series 2, which is too small to adequately characterize though favored to represent a hepatic cyst. Normal appearance of the gallbladder given degree distention. No radiopaque gallstones. No intra or extrahepatic biliary duct dilatation. No ascites. Pancreas: Normal appearance of the pancreas. Spleen: Normal appearance of the spleen. Adrenals/Urinary Tract: There is symmetric enhancement and excretion of the bilateral kidneys. No evidence of nephrolithiasis on this postcontrast examination. Note is made of an approximately 3.1 cm hypoattenuating left-sided renal cyst (image 29, series 7). Additional subcentimeter hypoattenuating renal lesions are too small to accurately characterize though favored to represent additional renal cysts. Normal appearance of the bilateral adrenal glands. Normal appearance of the urinary bladder given degree of distention. Stomach/Bowel: Stable positioning of both left-sided percutaneous drainage catheters. There is near complete resolution of previously noted dominant interloop abscess within the left lower abdomen/pelvis as the smaller pericolonic abscess adjacent to the distal descending colon following percutaneous drainage catheter placement. A moderate amount of residual mesenteric stranding persists. Scattered adjacent foci of subcutaneous air may represent the sequela of drainage catheter flushing.  There  is wall thickening involving the mesenteric portion of an adjacent loop of small bowel (image 52, series 2), with adjacent small foci of subcutaneous emphysema. No new definable/drainable fluid collection within the abdomen or pelvis. Redemonstrated extensive colonic diverticulosis. Ingested enteric contrast extends to the level of the rectum. Large colonic stool burden without evidence of enteric obstruction. No hiatal hernia. No pneumatosis or portal venous gas. Vascular/Lymphatic: Moderate to large amount of irregular mixed calcified and noncalcified atherosclerotic plaque within a mildly tortuous but normal caliber abdominal aorta. The major branch vessels of the abdominal aorta appear patent on this non CTA examination. No bulky retroperitoneal, mesenteric, pelvic or inguinal lymphadenopathy. Reproductive: Redemonstrated distension of the prosthetic portion of the urethra. Otherwise, normal appearance the prostate gland. No free fluid the pelvic cul-de-sac. Other: Minimal amount of subcutaneous edema about the left lateral abdominal wall. Musculoskeletal: No acute or aggressive osseous abnormalities. 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 Mild-to-moderate degenerative change the bilateral hips with joint space loss, subchondral sclerosis and osteophytosis. Note is made of a small os acetabuli bilaterally. IMPRESSION: 1. Interval reduction/resolution of dominant interloop abscess and pericolonic abscess following image guided percutaneous drainage catheter placement. While a moderate amount of residual mesenteric stranding persists, there are no new definable/drainable fluid collections within the abdomen or pelvis. 2. Redemonstrated wall thickening involving the mesenteric surface of an adjacent loop of small bowel with an adjacent foci of subcutaneous emphysema -  nonspecific though could represent a concomitant area of bowel perforation (in addition to the suspected diverticular abscess arising from the anterior surface of the descending colon). 3. Extensive colonic diverticulosis. 4. Large colonic stool burden without evidence of enteric obstruction. 5. Suspected hepatic steatosis.  Correlation with LFTs is advised. 6.  Aortic Atherosclerosis (ICD10-I70.0). PLAN: - Given the amount of persistent mesenteric stranding, do not recommend drainage catheter removal at this time. - Follow-up CT scan of the abdomen and pelvis in 1 week (January 2nd) followed by potential drainage catheter injection is advised. - Maintain continued flushing of the drainage catheter with 10 cc normal saline twice per day. - Recommend continued diligent monitoring and recording of daily drainage catheter output. Electronically Signed   By: Simonne Come M.D.   On: 09/04/2021 13:50    Assessment/Plan: This is a 75 y.o. male with acute diverticulitis with large intra-abdominal abscess.  - Clinically, the patient is feeling well without any worsening pain and has not been having any fevers either.  He does report a cough that he has had for a while and is not new.  However his WBC has begun to descend again today, with transition to meropenem.  - Okay for now to continue soft diet and heparin drip but both may need to be paused if the need for other procedures arise. - We will continue to follow along with you.   I spent 15 minutes dedicated to the care of this patient on the date of this encounter to include pre-visit review of records, face-to-face time with the patient discussing diagnosis and management, and any post-visit coordination of care.  Campbell Lerner, M.D., [facility]  09/05/2021 ; 10:34 AM

## 2021-09-05 NOTE — Progress Notes (Signed)
ANTICOAGULATION CONSULT NOTE  Pharmacy Consult for heparin post IR procedure Indication: pulmonary embolus  Patient Measurements: Height: 5\' 7"  (170.2 cm) Weight: 81.6 kg (180 lb) IBW/kg (Calculated) : 66.1 Heparin Dosing Weight: 81 kg  Labs: Recent Labs    09/03/21 0842 09/03/21 1756 09/04/21 0243 09/04/21 1306 09/04/21 2106 09/05/21 0601  HGB 13.8  --  13.3  --   --  13.0  HCT 41.8  --  40.2  --   --  39.7  PLT 335  --  355  --   --  351  HEPARINUNFRC 0.77*   < > 0.77* 0.60 0.64 0.75*  CREATININE 1.15  --  1.23  --   --  1.21   < > = values in this interval not displayed.     Estimated Creatinine Clearance: 52.3 mL/min (by C-G formula based on SCr of 1.21 mg/dL).   Medical History: Past Medical History:  Diagnosis Date   Diverticulitis    Enlarged prostate    Gallstones    Hypertension    Hyperthyroidism      Assessment: 75 year old male with abdominal pain sent for possible PE on outpatient CT imaging done 12/22. No anticoagulation noted PTA. Pharmacy consulted for restarting heparin post IR procedure with standard bleeding risk.  Date Time HL Rate/comment 1224 0553 0.14 1300 un/hr, subtherapeutic  1224 1501 0.28 1600 un/hr, subtherapeutic 1225 0106   0.52     1750 un/hr, therapeutic x 1  1225 0842 0.77 1750 un/hr, supratherapeutic 1225 1756 0.60 1700 un/hr, therapeutic x 1 1226    0243   0.77      1700 unit/hr, supratherapeutic  1226    1306   0.60      1600 unit/hr, therapeutic x 1 1226    2106   0.64      1600 unit/hr, therapeutic x 2  Goal of Therapy:  Heparin level 0.3-0.7 units/ml Monitor platelets by anticoagulation protocol: Yes   Plan:  12/27:  HL @ 0601 = 0.75, supratherapeutic  Decrease heparin drip rate to 1500 units/hr. Hemoglobin and Platelets remain stable. Will check HL in 8 hours after rate change, CBC daily while on Heparin drip.  1/28, PharmD Clinical Pharmacist 09/05/2021 9:46 AM

## 2021-09-05 NOTE — Progress Notes (Signed)
Doing well No pain abdomen Eating Son at bed side   Date of Admission:  08/31/2021    ID: Todd Sickle Sr. is a 75 y.o. male  Principal Problem:   Abdominal pain Active Problems:   TIA (transient ischemic attack)   CAD (coronary artery disease)   Benign essential hypertension   Type 2 diabetes mellitus (HCC)   Abdominopelvic abscess (HCC)   Pulmonary embolism (HCC)   Diverticulitis of large intestine with abscess without bleeding  Son at bed side  Subjective: Doing well No pain abdomen No fever Says 20 yrs ago when he took cipro he had hives- he is not sure if it was cipro. Never had any swelling of lips or SOB Willing to try levaquin  Medications:   atorvastatin  80 mg Oral QHS   isosorbide mononitrate  60 mg Oral Daily   metoprolol succinate  12.5 mg Oral Daily   polyethylene glycol  34 g Oral BID   propylthiouracil  25 mg Oral BID   sodium chloride flush  3 mL Intravenous Q12H   sodium chloride flush  5 mL Intracatheter Q8H    Objective: Vital signs in last 24 hours: Temp:  [97.6 F (36.4 C)-98.3 F (36.8 C)] 97.6 F (36.4 C) (12/27 0836) Pulse Rate:  [67-91] 67 (12/27 0836) Resp:  [16-20] 17 (12/27 0836) BP: (97-103)/(65-71) 99/71 (12/27 0836) SpO2:  [93 %-94 %] 93 % (12/27 0836)  PHYSICAL EXAM:  General: Alert, cooperative, no distress, appears stated age.  Head: Normocephalic, without obvious abnormality, atraumatic. Eyes: Conjunctivae clear, anicteric sclerae. Pupils are equal ENT Nares normal. No drainage or sinus tenderness. Lips, mucosa, and tongue normal. No Thrush Neck: Supple, symmetrical, no adenopathy, thyroid: non tender no carotid bruit and no JVD. Back: No CVA tenderness. Lungs: Clear to auscultation bilaterally. No Wheezing or Rhonchi. No rales. Heart: Regular rate and rhythm, no murmur, rub or gallop. Abdomen: Soft, non-tender,not distended. Bowel sounds normal. No masses. Drain present left lower quadrant Extremities: atraumatic,  no cyanosis. No edema. No clubbing Skin: No rashes or lesions. Or bruising Lymph: Cervical, supraclavicular normal. Neurologic: Grossly non-focal  Lab Results Recent Labs    09/04/21 0243 09/05/21 0601  WBC 16.9* 14.5*  HGB 13.3 13.0  HCT 40.2 39.7  NA 130* 136  K 3.6 3.5  CL 98 102  CO2 26 24  BUN 10 11  CREATININE 1.23 1.21   Liver Panel No results for input(s): PROT, ALBUMIN, AST, ALT, ALKPHOS, BILITOT, BILIDIR, IBILI in the last 72 hours. Sedimentation Rate No results for input(s): ESRSEDRATE in the last 72 hours. C-Reactive Protein No results for input(s): CRP in the last 72 hours.  Microbiology:  Studies/Results: CT ABDOMEN PELVIS W CONTRAST  Result Date: 09/04/2021 CLINICAL DATA:  Abscess diverticular abscess, post percutaneous drainage catheter placement x2 on 09/01/2021. EXAM: CT ABDOMEN AND PELVIS WITH CONTRAST TECHNIQUE: Multidetector CT imaging of the abdomen and pelvis was performed using the standard protocol following bolus administration of intravenous contrast. CONTRAST:  OMNIPAQUE IOHEXOL 300 MG/ML  SOLN COMPARISON:  CT abdomen pelvis-08/31/2021; CT-guided per drainage catheter placement x2-09/01/2021 FINDINGS: Lower chest: Limited visualization of the lower thorax demonstrates minimal dependent subpleural ground-glass atelectasis, most conspicuous within the left lower lobe. Normal heart size.  No pericardial effusion. Hepatobiliary: Normal hepatic contour. There is diffuse decreased attenuation of the hepatic parenchyma suggestive of hepatic steatosis. There is a punctate (approximately 0.6 cm) hypoattenuating lesion within the dome of the right lobe of the liver (image 15, series 2,  which is too small to adequately characterize though favored to represent a hepatic cyst. Normal appearance of the gallbladder given degree distention. No radiopaque gallstones. No intra or extrahepatic biliary duct dilatation. No ascites. Pancreas: Normal appearance of the  pancreas. Spleen: Normal appearance of the spleen. Adrenals/Urinary Tract: There is symmetric enhancement and excretion of the bilateral kidneys. No evidence of nephrolithiasis on this postcontrast examination. Note is made of an approximately 3.1 cm hypoattenuating left-sided renal cyst (image 29, series 7). Additional subcentimeter hypoattenuating renal lesions are too small to accurately characterize though favored to represent additional renal cysts. Normal appearance of the bilateral adrenal glands. Normal appearance of the urinary bladder given degree of distention. Stomach/Bowel: Stable positioning of both left-sided percutaneous drainage catheters. There is near complete resolution of previously noted dominant interloop abscess within the left lower abdomen/pelvis as the smaller pericolonic abscess adjacent to the distal descending colon following percutaneous drainage catheter placement. A moderate amount of residual mesenteric stranding persists. Scattered adjacent foci of subcutaneous air may represent the sequela of drainage catheter flushing. There is wall thickening involving the mesenteric portion of an adjacent loop of small bowel (image 52, series 2), with adjacent small foci of subcutaneous emphysema. No new definable/drainable fluid collection within the abdomen or pelvis. Redemonstrated extensive colonic diverticulosis. Ingested enteric contrast extends to the level of the rectum. Large colonic stool burden without evidence of enteric obstruction. No hiatal hernia. No pneumatosis or portal venous gas. Vascular/Lymphatic: Moderate to large amount of irregular mixed calcified and noncalcified atherosclerotic plaque within a mildly tortuous but normal caliber abdominal aorta. The major branch vessels of the abdominal aorta appear patent on this non CTA examination. No bulky retroperitoneal, mesenteric, pelvic or inguinal lymphadenopathy. Reproductive: Redemonstrated distension of the prosthetic  portion of the urethra. Otherwise, normal appearance the prostate gland. No free fluid the pelvic cul-de-sac. Other: Minimal amount of subcutaneous edema about the left lateral abdominal wall. Musculoskeletal: No acute or aggressive osseous abnormalities. 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 Mild-to-moderate degenerative change the bilateral hips with joint space loss, subchondral sclerosis and osteophytosis. Note is made of a small os acetabuli bilaterally. IMPRESSION: 1. Interval reduction/resolution of dominant interloop abscess and pericolonic abscess following image guided percutaneous drainage catheter placement. While a moderate amount of residual mesenteric stranding persists, there are no new definable/drainable fluid collections within the abdomen or pelvis. 2. Redemonstrated wall thickening involving the mesenteric surface of an adjacent loop of small bowel with an adjacent foci of subcutaneous emphysema - nonspecific though could represent a concomitant area of bowel perforation (in addition to the suspected diverticular abscess arising from the anterior surface of the descending colon). 3. Extensive colonic diverticulosis. 4. Large colonic stool burden without evidence of enteric obstruction. 5. Suspected hepatic steatosis.  Correlation with LFTs is advised. 6.  Aortic Atherosclerosis (ICD10-I70.0). PLAN: - Given the amount of persistent mesenteric stranding, do not recommend drainage catheter removal at this time. - Follow-up CT scan of the abdomen and pelvis in 1 week (January 2nd) followed by potential drainage catheter injection is advised. - Maintain continued flushing of the drainage catheter with 10 cc normal saline twice per day. - Recommend continued diligent monitoring and recording of daily drainage catheter output. Electronically Signed   By: Sandi Mariscal  M.D.   On: 09/04/2021 13:50     Assessment/Plan: Intraabdominal abscess  Smaller abscesses Diverticular abscess due to  diverticular  perforation S/p drain placement It is surprising that he has Pseudomonas, enterobacter aerogenes which are usually not normal clonic flora. Also has e.coli Leucocytosis Currently on meropenem Will give for 7-10 days  PO levaquin is an option He had hives to cipro 20 yrs ago-Never had swelling of oral mucosa or sob-  levaquin may not have the same effect- pt says he wants to try levaquin while in the hospital when he is under observation Will try to give  small dose  5-10mg  first and see . Will discuss with pharmacist tomorrow   There is stool in the bag and very likely the drain is in the colon   HTN CAD- Imdur, metoprolol and lipitor ? Graves disease - on  PTU-  followed by Dr.Solum Discussed with patient and his son

## 2021-09-05 NOTE — Progress Notes (Signed)
ANTICOAGULATION CONSULT NOTE  Pharmacy Consult for heparin post IR procedure Indication: pulmonary embolus  Patient Measurements: Height: 5\' 7"  (170.2 cm) Weight: 81.6 kg (180 lb) IBW/kg (Calculated) : 66.1 Heparin Dosing Weight: 81 kg  Labs: Recent Labs    09/03/21 0842 09/03/21 1756 09/04/21 0243 09/04/21 1306 09/04/21 2106 09/05/21 0601 09/05/21 1708  HGB 13.8  --  13.3  --   --  13.0  --   HCT 41.8  --  40.2  --   --  39.7  --   PLT 335  --  355  --   --  351  --   HEPARINUNFRC 0.77*   < > 0.77*   < > 0.64 0.75* 0.72*  CREATININE 1.15  --  1.23  --   --  1.21  --    < > = values in this interval not displayed.     Estimated Creatinine Clearance: 52.3 mL/min (by C-G formula based on SCr of 1.21 mg/dL).   Medical History: Past Medical History:  Diagnosis Date   Diverticulitis    Enlarged prostate    Gallstones    Hypertension    Hyperthyroidism      Assessment: 75 year old male with abdominal pain sent for possible PE on outpatient CT imaging done 12/22. No anticoagulation noted PTA. Pharmacy consulted for restarting heparin post IR procedure with standard bleeding risk.  Date Time HL Rate/comment 1224 0553 0.14 1300 un/hr, subtherapeutic  1224 1501 0.28 1600 un/hr, subtherapeutic 1225 0106   0.52     1750 un/hr, therapeutic x 1  1225 0842 0.77 1750 un/hr, supratherapeutic 1225 1756 0.60 1700 un/hr, therapeutic x 1 1226    0243   0.77      1700 unit/hr, supratherapeutic  1226    1306   0.60      1600 unit/hr, therapeutic x 1 1226    2106   0.64      1600 unit/hr, therapeutic x 2 1227 0601 0.75  Supratherapeutic 1227 1708 0.72  Supratherapeutic   Goal of Therapy:  Heparin level 0.3-0.7 units/ml Monitor platelets by anticoagulation protocol: Yes   Plan:  HL supratherapeutic  Decrease heparin drip rate to 1400 units/hr. Hemoglobin and Platelets remain stable. Will check HL in 8 hours after rate change, CBC daily while on heparin drip.  2107, PharmD Clinical Pharmacist 09/05/2021 6:04 PM

## 2021-09-06 DIAGNOSIS — K5720 Diverticulitis of large intestine with perforation and abscess without bleeding: Secondary | ICD-10-CM | POA: Diagnosis not present

## 2021-09-06 DIAGNOSIS — K651 Peritoneal abscess: Secondary | ICD-10-CM | POA: Diagnosis not present

## 2021-09-06 LAB — CBC
HCT: 37.2 % — ABNORMAL LOW (ref 39.0–52.0)
Hemoglobin: 12.1 g/dL — ABNORMAL LOW (ref 13.0–17.0)
MCH: 29.6 pg (ref 26.0–34.0)
MCHC: 32.5 g/dL (ref 30.0–36.0)
MCV: 91 fL (ref 80.0–100.0)
Platelets: 384 10*3/uL (ref 150–400)
RBC: 4.09 MIL/uL — ABNORMAL LOW (ref 4.22–5.81)
RDW: 13.7 % (ref 11.5–15.5)
WBC: 13.6 10*3/uL — ABNORMAL HIGH (ref 4.0–10.5)
nRBC: 0 % (ref 0.0–0.2)

## 2021-09-06 LAB — HEPARIN LEVEL (UNFRACTIONATED)
Heparin Unfractionated: 0.55 IU/mL (ref 0.30–0.70)
Heparin Unfractionated: 0.6 IU/mL (ref 0.30–0.70)

## 2021-09-06 LAB — BASIC METABOLIC PANEL
Anion gap: 6 (ref 5–15)
BUN: 20 mg/dL (ref 8–23)
CO2: 27 mmol/L (ref 22–32)
Calcium: 8.9 mg/dL (ref 8.9–10.3)
Chloride: 104 mmol/L (ref 98–111)
Creatinine, Ser: 1.02 mg/dL (ref 0.61–1.24)
GFR, Estimated: >60 mL/min (ref >60)
Glucose, Bld: 123 mg/dL — ABNORMAL HIGH (ref 70–99)
Potassium: 4.2 mmol/L (ref 3.5–5.1)
Sodium: 137 mmol/L (ref 135–145)

## 2021-09-06 LAB — MAGNESIUM: Magnesium: 2.2 mg/dL (ref 1.7–2.4)

## 2021-09-06 NOTE — Progress Notes (Signed)
Progress Note    Todd HARPSTER Sr.  YME:[id number] DOB: 09/07/1946  DOA: 08/31/2021 PCP: Barbette Reichmann, MD      Brief Narrative:    Medical records reviewed and are as summarized below:  Todd E Ninneman Sr. is a 75 y.o. male with medical history significant for diverticulitis, BPH, gallstones, hypertension, hyperthyroidism, COVID-19 infection, who presented to the hospital with abdominal pain.      Assessment/Plan:   Principal Problem:   Abdominal pain Active Problems:   TIA (transient ischemic attack)   CAD (coronary artery disease)   Benign essential hypertension   Type 2 diabetes mellitus (HCC)   Abdominopelvic abscess (HCC)   Pulmonary embolism (HCC)   Diverticulitis of large intestine with abscess without bleeding    Body mass index is 28.19 kg/m.  Sepsis secondary to acute complicated diverticulitis with abscess: S/p abdominal drain by IR on 09/01/2021.  Continue IV meropenem through 09/14/2021 per ID.  Culture from diverticular abscess drain revealed Enterobacter originates, E. coli, Pseudomonas aeruginosa, Bacteroides species.  Follow-up with ID and general surgeon.  Chronic pulmonary embolism: Continue IV heparin and monitor heparin level per protocol.  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  Continue antitussives.  Diet Order             DIET SOFT Room service appropriate? Yes; Fluid consistency: Thin  Diet effective now                      Consultants: General surgeon Infectious disease  Procedures: Abdominal drain by IR on 09/01/2021    Medications:    atorvastatin  80 mg Oral QHS   isosorbide mononitrate  60 mg Oral Daily   metoprolol succinate  12.5 mg Oral Daily   polyethylene glycol  34 g Oral BID   propylthiouracil  25 mg Oral BID   sodium chloride flush  3 mL Intravenous Q12H   sodium chloride flush  5 mL  Intracatheter Q8H   Continuous Infusions:  heparin 1,400 Units/hr (09/05/21 2357)   meropenem (MERREM) IV 1 g (09/06/21 1429)     Anti-infectives (From admission, onward)    Start     Dose/Rate Route Frequency Ordered Stop   09/04/21 1800  meropenem (MERREM) 1 g in sodium chloride 0.9 % 100 mL IVPB        1 g 200 mL/hr over 30 Minutes Intravenous Every 8 hours 09/04/21 1618     09/02/21 0100  piperacillin-tazobactam (ZOSYN) IVPB 3.375 g  Status:  Discontinued        3.375 g 12.5 mL/hr over 240 Minutes Intravenous Every 8 hours 09/01/21 2158 09/04/21 1616   09/01/21 1400  piperacillin-tazobactam (ZOSYN) IVPB 3.375 g  Status:  Discontinued        3.375 g 12.5 mL/hr over 240 Minutes Intravenous Every 8 hours 09/01/21 1029 09/01/21 2158   08/31/21 2100  piperacillin-tazobactam (ZOSYN) IVPB 3.375 g  Status:  Discontinued        3.375 g 100 mL/hr over 30 Minutes Intravenous Every 6 hours 08/31/21 1948 09/01/21 1029   08/31/21 1800  cefTRIAXone (ROCEPHIN) 1 g in sodium chloride 0.9 % 100 mL IVPB        1 g 200 mL/hr over 30 Minutes Intravenous  Once 08/31/21 1758 08/31/21 1912   08/31/21 1800  metroNIDAZOLE (FLAGYL) IVPB 500 mg        500 mg 100 mL/hr over 60  Minutes Intravenous  Once 08/31/21 1758 08/31/21 1928              Family Communication/Anticipated D/C date and plan/Code Status   DVT prophylaxis: SCDs Start: 08/31/21 1938     Code Status: Full Code  Family Communication: Plan discussed with his daughter at the bedside Disposition Plan: Plan to discharge home when cleared by general surgeon   Status is: Inpatient  Remains inpatient appropriate because: On IV antibiotics           Subjective:   Interval events noted.  No abdominal pain or vomiting.  His daughter was at the bedside.  Objective:    Vitals:   09/05/21 2051 09/06/21 0410 09/06/21 0834 09/06/21 1520  BP: 111/70 103/65 122/73 (!) 100/55  Pulse: 79 63 71 83  Resp: 20 18 20 18    Temp: 98.6 F (37 C) 97.8 F (36.6 C) 98 F (36.7 C) 97.7 F (36.5 C)  TempSrc: Oral Oral Oral Oral  SpO2: 91% 93% 95% 93%  Weight:      Height:       No data found.   Intake/Output Summary (Last 24 hours) at 09/06/2021 1649 Last data filed at 09/06/2021 1433 Gross per 24 hour  Intake 1161.87 ml  Output 350 ml  Net 811.87 ml   Filed Weights   08/31/21 1747  Weight: 81.6 kg    Exam:  GEN: NAD SKIN: No rash EYES: EOMI ENT: MMM CV: RRR PULM: CTA B ABD: soft, ND, NT, +BS, + drain with feculent drainage CNS: AAO x 3, non focal EXT: No edema or tenderness        Data Reviewed:   I have personally reviewed following labs and imaging studies:  Labs: Labs show the following:   Basic Metabolic Panel: Recent Labs  Lab 09/02/21 0419 09/03/21 0842 09/04/21 0243 09/05/21 0601 09/06/21 0302  NA 134* 132* 130* 136 137  K 3.9 3.8 3.6 3.5 4.2  CL 105 102 98 102 104  CO2 23 21* 26 24 27   GLUCOSE 86 106* 113* 124* 123*  BUN 11 9 10 11 20   CREATININE 0.79 1.15 1.23 1.21 1.02  CALCIUM 8.6* 8.5* 8.8* 8.6* 8.9  MG 2.1 2.0 2.2 2.1 2.2   GFR Estimated Creatinine Clearance: 62 mL/min (by C-G formula based on SCr of 1.02 mg/dL). Liver Function Tests: Recent Labs  Lab 08/31/21 1748 09/01/21 0500  AST 37 31  ALT 52* 43  ALKPHOS 106 84  BILITOT 0.8 0.9  PROT 8.2* 6.8  ALBUMIN 3.2* 2.5*   Recent Labs  Lab 08/31/21 1748  LIPASE 44   No results for input(s): AMMONIA in the last 168 hours. Coagulation profile Recent Labs  Lab 08/31/21 1748  INR 1.0    CBC: Recent Labs  Lab 09/02/21 0419 09/03/21 0842 09/04/21 0243 09/05/21 0601 09/06/21 0302  WBC 11.5* 14.5* 16.9* 14.5* 13.6*  HGB 13.4 13.8 13.3 13.0 12.1*  HCT 40.6 41.8 40.2 39.7 37.2*  MCV 89.2 88.7 88.7 89.4 91.0  PLT 315 335 355 351 384   Cardiac Enzymes: No results for input(s): CKTOTAL, CKMB, CKMBINDEX, TROPONINI in the last 168 hours. BNP (last 3 results) No results for input(s):  PROBNP in the last 8760 hours. CBG: Recent Labs  Lab 09/01/21 2206 09/02/21 0909 09/02/21 1250 09/02/21 1723 09/02/21 2128  GLUCAP 84 104* 129* 95 105*   D-Dimer: No results for input(s): DDIMER in the last 72 hours. Hgb A1c: No results for input(s): HGBA1C in the last 72  hours. Lipid Profile: No results for input(s): CHOL, HDL, LDLCALC, TRIG, CHOLHDL, LDLDIRECT in the last 72 hours. Thyroid function studies: No results for input(s): TSH, T4TOTAL, T3FREE, THYROIDAB in the last 72 hours.  Invalid input(s): FREET3 Anemia work up: No results for input(s): VITAMINB12, FOLATE, FERRITIN, TIBC, IRON, RETICCTPCT in the last 72 hours. Sepsis Labs: Recent Labs  Lab 08/31/21 1810 09/01/21 0500 09/03/21 0842 09/04/21 0243 09/05/21 0601 09/06/21 0302  WBC  --    < > 14.5* 16.9* 14.5* 13.6*  LATICACIDVEN 1.3  --   --   --   --   --    < > = values in this interval not displayed.    Microbiology Recent Results (from the past 240 hour(s))  Resp Panel by RT-PCR (Flu A&B, Covid) Nasopharyngeal Swab     Status: None   Collection Time: 08/31/21  6:10 PM   Specimen: Nasopharyngeal Swab; Nasopharyngeal(NP) swabs in vial transport medium  Result Value Ref Range Status   SARS Coronavirus 2 by RT PCR NEGATIVE NEGATIVE Final    Comment: (NOTE) SARS-CoV-2 target nucleic acids are NOT DETECTED.  The SARS-CoV-2 RNA is generally detectable in upper respiratory specimens during the acute phase of infection. The lowest concentration of SARS-CoV-2 viral copies this assay can detect is 138 copies/mL. A negative result does not preclude SARS-Cov-2 infection and should not be used as the sole basis for treatment or other patient management decisions. A negative result may occur with  improper specimen collection/handling, submission of specimen other than nasopharyngeal swab, presence of viral mutation(s) within the areas targeted by this assay, and inadequate number of viral copies(<138  copies/mL). A negative result must be combined with clinical observations, patient history, and epidemiological information. The expected result is Negative.  Fact Sheet for Patients:  BloggerCourse.com  Fact Sheet for Healthcare Providers:  SeriousBroker.it  This test is no t yet approved or cleared by the [place] FDA and  has been authorized for detection and/or diagnosis of SARS-CoV-2 by FDA under an Emergency Use Authorization (EUA). This EUA will remain  in effect (meaning this test can be used) for the duration of the COVID-19 declaration under Section 564(b)(1) of the Act, 21 U.S.C.section 360bbb-3(b)(1), unless the authorization is terminated  or revoked sooner.       Influenza A by PCR NEGATIVE NEGATIVE Final   Influenza B by PCR NEGATIVE NEGATIVE Final    Comment: (NOTE) The Xpert Xpress SARS-CoV-2/FLU/RSV plus assay is intended as an aid in the diagnosis of influenza from Nasopharyngeal swab specimens and should not be used as a sole basis for treatment. Nasal washings and aspirates are unacceptable for Xpert Xpress SARS-CoV-2/FLU/RSV testing.  Fact Sheet for Patients: BloggerCourse.com  Fact Sheet for Healthcare Providers: SeriousBroker.it  This test is not yet approved or cleared by the [place] FDA and has been authorized for detection and/or diagnosis of SARS-CoV-2 by FDA under an Emergency Use Authorization (EUA). This EUA will remain in effect (meaning this test can be used) for the duration of the COVID-19 declaration under Section 564(b)(1) of the Act, 21 U.S.C. section 360bbb-3(b)(1), unless the authorization is terminated or revoked.  Performed at [facility], [address] [place], [place] [address]   Aerobic/Anaerobic Culture w Gram Stain (surgical/deep wound)     Status: None   Collection Time: 09/01/21  2:10 PM   Specimen:  Wound; Abscess  Result Value Ref Range Status   Specimen Description   Final    WOUND Performed at [facility]  [facility], [address], [place], [place] [address]    Special Requests   Final    [facility] ABSCESS Performed at [facility], [address] [place], [place] [address]    Gram Stain   Final    ABUNDANT WBC PRESENT, PREDOMINANTLY MONONUCLEAR FEW GRAM NEGATIVE RODS    Culture   Final    MODERATE ENTEROBACTER AEROGENES RARE ESCHERICHIA COLI RARE PSEUDOMONAS AERUGINOSA RARE BACTEROIDES DISTASONIS BETA LACTAMASE POSITIVE Performed at [facility], [address] [place], [place] [address]    Report Status 09/05/2021 FINAL  Final   Organism ID, Bacteria ENTEROBACTER AEROGENES  Final   Organism ID, Bacteria ESCHERICHIA COLI  Final   Organism ID, Bacteria PSEUDOMONAS AERUGINOSA  Final      Susceptibility   Enterobacter aerogenes - MIC*    CEFAZOLIN >=64 RESISTANT Resistant     CEFEPIME 0.5 SENSITIVE Sensitive     CEFTAZIDIME >=64 RESISTANT Resistant     CEFTRIAXONE >=64 RESISTANT Resistant     CIPROFLOXACIN <=0.25 SENSITIVE Sensitive     GENTAMICIN <=1 SENSITIVE Sensitive     IMIPENEM <=0.25 SENSITIVE Sensitive     TRIMETH/SULFA <=20 SENSITIVE Sensitive     PIP/TAZO >=128 RESISTANT Resistant     * MODERATE ENTEROBACTER AEROGENES   Escherichia coli - MIC*    AMPICILLIN 4 SENSITIVE Sensitive     CEFAZOLIN <=4 SENSITIVE Sensitive     CEFEPIME <=0.12 SENSITIVE Sensitive     CEFTAZIDIME <=1 SENSITIVE Sensitive     CEFTRIAXONE <=0.25 SENSITIVE Sensitive     CIPROFLOXACIN <=0.25 SENSITIVE Sensitive     GENTAMICIN <=1 SENSITIVE Sensitive     IMIPENEM <=0.25 SENSITIVE Sensitive     TRIMETH/SULFA <=20 SENSITIVE Sensitive     AMPICILLIN/SULBACTAM <=2 SENSITIVE Sensitive     PIP/TAZO <=4 SENSITIVE Sensitive     * RARE ESCHERICHIA COLI   Pseudomonas aeruginosa - MIC*    CEFTAZIDIME <=1 SENSITIVE Sensitive     CIPROFLOXACIN <=0.25 SENSITIVE Sensitive      GENTAMICIN <=1 SENSITIVE Sensitive     IMIPENEM 2 SENSITIVE Sensitive     PIP/TAZO <=4 SENSITIVE Sensitive     CEFEPIME 1 SENSITIVE Sensitive     * RARE PSEUDOMONAS AERUGINOSA    Procedures and diagnostic studies:  No results found.             LOS: 6 days   Aven Christen  [facility]   Pager on [email or web address]. If 7PM-7AM, please contact night-coverage at [email or web address]     09/06/2021, 4:49 PM

## 2021-09-06 NOTE — Progress Notes (Signed)
° °  Date of Admission:  08/31/2021    ID: Todd Sickle Sr. is a 75 y.o. male Principal Problem:   Abdominal pain Active Problems:   TIA (transient ischemic attack)   CAD (coronary artery disease)   Benign essential hypertension   Type 2 diabetes mellitus (HCC)   Abdominopelvic abscess (HCC)   Pulmonary embolism (HCC)   Diverticulitis of large intestine with abscess without bleeding    Subjective: Feeling good Walking with PT No pain abdomen Ate well  Medications:   atorvastatin  80 mg Oral QHS   isosorbide mononitrate  60 mg Oral Daily   metoprolol succinate  12.5 mg Oral Daily   polyethylene glycol  34 g Oral BID   propylthiouracil  25 mg Oral BID   sodium chloride flush  3 mL Intravenous Q12H   sodium chloride flush  5 mL Intracatheter Q8H    Objective: Vital signs in last 24 hours: Temp:  [97.8 F (36.6 C)-98.6 F (37 C)] 98 F (36.7 C) (12/28 0834) Pulse Rate:  [63-80] 71 (12/28 0834) Resp:  [18-20] 20 (12/28 0834) BP: (93-122)/(53-73) 122/73 (12/28 0834) SpO2:  [91 %-96 %] 95 % (12/28 0834)  PHYSICAL EXAM:  General: Alert, cooperative, no distress, appears stated age.  Lungs: Clear to auscultation bilaterally. No Wheezing or Rhonchi. No rales. Heart: Regular rate and rhythm, no murmur, rub or gallop. Abdomen: Soft, drains left lower quadrant Extremities: atraumatic, no cyanosis. No edema. No clubbing Skin: No rashes or lesions. Or bruising Lymph: Cervical, supraclavicular normal. Neurologic: Grossly non-focal  Lab Results Recent Labs    09/05/21 0601 09/06/21 0302  WBC 14.5* 13.6*  HGB 13.0 12.1*  HCT 39.7 37.2*  NA 136 137  K 3.5 4.2  CL 102 104  CO2 24 27  BUN 11 20  CREATININE 1.21 1.02   Liver Panel No results for input(s): PROT, ALBUMIN, AST, ALT, ALKPHOS, BILITOT, BILIDIR, IBILI in the last 72 hours. Sedimentation Rate No results for input(s): ESRSEDRATE in the last 72 hours. C-Reactive Protein No results for input(s): CRP in the  last 72 hours.  Microbiology:  Studies/Results: No results found.   Assessment/Plan: Intraabdominal abscesses Diverticular abscess due to  diverticular perforation S/p drain placement It is surprising that he has Pseudomonas, enterobacter aerogenes which are usually not normal colonic flora. Also has e.coli Leucocytosis Currently on meropenem Will give until 09/14/21   Initially PO levaquin iwas considered but it will not cover enterococcus or other strep species well - so decided to continue with meropenem and not give a test dose of levaquin  There is stool in the bag and very likely the drain is in the colon   HTN CAD- Imdur, metoprolol and lipitor ? Graves disease - on  PTU-  followed by Dr.Solum    Discussed the management with the patient and care team

## 2021-09-06 NOTE — Progress Notes (Signed)
Mobility Specialist - Progress Note   09/06/21 1200  Mobility  Activity Ambulated in hall  Level of Assistance Independent  Assistive Device None  Distance Ambulated (ft) 550 ft  Mobility Ambulated independently in hallway  Mobility Response Tolerated well  Mobility performed by Mobility specialist  $Mobility charge 1 Mobility    Pt ambulated in hallway independently, no LOB. No complaints. Pt returned to bed with family at bedside.    Filiberto Pinks Mobility Specialist 09/06/21, 12:26 PM

## 2021-09-06 NOTE — Progress Notes (Signed)
ANTICOAGULATION CONSULT NOTE  Pharmacy Consult for heparin post IR procedure Indication: pulmonary embolus  Patient Measurements: Height: 5\' 7"  (170.2 cm) Weight: 81.6 kg (180 lb) IBW/kg (Calculated) : 66.1 Heparin Dosing Weight: 81 kg  Labs: Recent Labs    09/04/21 0243 09/04/21 1306 09/05/21 0601 09/05/21 1708 09/06/21 0302 09/06/21 0459 09/06/21 1305  HGB 13.3  --  13.0  --  12.1*  --   --   HCT 40.2  --  39.7  --  37.2*  --   --   PLT 355  --  351  --  384  --   --   HEPARINUNFRC 0.77*   < > 0.75* 0.72*  --  0.55 0.60  CREATININE 1.23  --  1.21  --  1.02  --   --    < > = values in this interval not displayed.     Estimated Creatinine Clearance: 62 mL/min (by C-G formula based on SCr of 1.02 mg/dL).   Medical History: Past Medical History:  Diagnosis Date   Diverticulitis    Enlarged prostate    Gallstones    Hypertension    Hyperthyroidism      Assessment: 75 year old male with abdominal pain sent for possible PE on outpatient CT imaging done 12/22. No anticoagulation noted PTA. Pharmacy consulted for restarting heparin post IR procedure with standard bleeding risk.  Date Time HL Rate/comment 1224 0553 0.14 1300 un/hr, subtherapeutic  1224 1501 0.28 1600 un/hr, subtherapeutic 1225 0106   0.52     1750 un/hr, therapeutic x 1  1225 0842 0.77 1750 un/hr, supratherapeutic 1225 1756 0.60 1700 un/hr, therapeutic x 1 1226    0243   0.77      1700 unit/hr, supratherapeutic  1226    1306   0.60      1600 unit/hr, therapeutic x 1 1226    2106   0.64      1600 unit/hr, therapeutic x 2 1227 0601 0.75  Supratherapeutic 1227 1708 0.72  Supratherapeutic  1228 0459 0.55 Therapeutic x 1  Goal of Therapy:  Heparin level 0.3-0.7 units/ml Monitor platelets by anticoagulation protocol: Yes   Plan:  12/28@1305 : HL 0.60. Therapeutic x2. Continue heparin drip rate at 1400 units/hr. Hemoglobin and Platelets remain stable. Will recheck HL with AM labs CBC daily while on  heparin drip.  2107, PharmD Clinical Pharmacist 09/06/2021 1:37 PM

## 2021-09-06 NOTE — Progress Notes (Signed)
ANTICOAGULATION CONSULT NOTE  Pharmacy Consult for heparin post IR procedure Indication: pulmonary embolus  Patient Measurements: Height: 5\' 7"  (170.2 cm) Weight: 81.6 kg (180 lb) IBW/kg (Calculated) : 66.1 Heparin Dosing Weight: 81 kg  Labs: Recent Labs    09/04/21 0243 09/04/21 1306 09/05/21 0601 09/05/21 1708 09/06/21 0302 09/06/21 0459  HGB 13.3  --  13.0  --  12.1*  --   HCT 40.2  --  39.7  --  37.2*  --   PLT 355  --  351  --  384  --   HEPARINUNFRC 0.77*   < > 0.75* 0.72*  --  0.55  CREATININE 1.23  --  1.21  --  1.02  --    < > = values in this interval not displayed.     Estimated Creatinine Clearance: 62 mL/min (by C-G formula based on SCr of 1.02 mg/dL).   Medical History: Past Medical History:  Diagnosis Date   Diverticulitis    Enlarged prostate    Gallstones    Hypertension    Hyperthyroidism      Assessment: 75 year old male with abdominal pain sent for possible PE on outpatient CT imaging done 12/22. No anticoagulation noted PTA. Pharmacy consulted for restarting heparin post IR procedure with standard bleeding risk.  Date Time HL Rate/comment 1224 0553 0.14 1300 un/hr, subtherapeutic  1224 1501 0.28 1600 un/hr, subtherapeutic 1225 0106   0.52     1750 un/hr, therapeutic x 1  1225 0842 0.77 1750 un/hr, supratherapeutic 1225 1756 0.60 1700 un/hr, therapeutic x 1 1226    0243   0.77      1700 unit/hr, supratherapeutic  1226    1306   0.60      1600 unit/hr, therapeutic x 1 1226    2106   0.64      1600 unit/hr, therapeutic x 2 1227 0601 0.75  Supratherapeutic 1227 1708 0.72  Supratherapeutic  1228 0459 0.55 Therapeutic x 1  Goal of Therapy:  Heparin level 0.3-0.7 units/ml Monitor platelets by anticoagulation protocol: Yes   Plan:  Continue heparin drip rate at 1400 units/hr. Hemoglobin and Platelets remain stable. Will recheck HL in 8 hours to confirm CBC daily while on heparin drip.  2107, PharmD, [facility] 09/06/2021 6:42  AM

## 2021-09-06 NOTE — Progress Notes (Signed)
Patient ID: Todd Sickle Sr., male   DOB: 07-16-1946, 75 y.o.   MRN: [id number]     SURGICAL PROGRESS NOTE   Hospital Day(s): 6.   Interval History: Patient seen and examined, no acute events or new complaints overnight. Patient reports feeling better.  He denies abdominal pain.  He endorsed that he has been able to ambulate adequately.  Denies any fever.  Denies any nausea or vomiting.  Endorses tolerating diet.  Vital signs in last 24 hours: [min-max] current  Temp:  [97.8 F (36.6 C)-98.6 F (37 C)] 98 F (36.7 C) (12/28 0834) Pulse Rate:  [63-80] 71 (12/28 0834) Resp:  [18-20] 20 (12/28 0834) BP: (93-122)/(53-73) 122/73 (12/28 0834) SpO2:  [91 %-96 %] 95 % (12/28 0834)     Height: 5\' 7"  (170.2 cm) Weight: 81.6 kg BMI (Calculated): 28.19   Physical Exam:  Constitutional: alert, cooperative and no distress  Respiratory: breathing non-labored at rest  Cardiovascular: regular rate and sinus rhythm  Gastrointestinal: soft, non-tender, and non-distended.  Drains with feculent purulent output  Labs:  CBC Latest Ref Rng & Units 09/06/2021 09/05/2021 09/04/2021  WBC 4.0 - 10.5 K/uL 13.6(H) 14.5(H) 16.9(H)  Hemoglobin 13.0 - 17.0 g/dL 12.1(L) 13.0 13.3  Hematocrit 39.0 - 52.0 % 37.2(L) 39.7 40.2  Platelets 150 - 400 K/uL 384 351 355   CMP Latest Ref Rng & Units 09/06/2021 09/05/2021 09/04/2021  Glucose 70 - 99 mg/dL 09/06/2021) 694(W) 546(E)  BUN 8 - 23 mg/dL 20 11 10   Creatinine 0.61 - 1.24 mg/dL 703(J 0.09  Sodium 135 - 145 mmol/L 137 136 130(L)  Potassium 3.5 - 5.1 mmol/L 4.2 3.5 3.6  Chloride 98 - 111 mmol/L 104 102 98  CO2 22 - 32 mmol/L 27 24 26   Calcium 8.9 - 10.3 mg/dL 8.9 3.81) 8.29)  Total Protein 6.5 - 8.1 g/dL - - -  Total Bilirubin 0.3 - 1.2 mg/dL - - -  Alkaline Phos 38 - 126 U/L - - -  AST 15 - 41 U/L - - -  ALT 0 - 44 U/L - - -    Imaging studies: No new pertinent imaging studies   Assessment/Plan:  75 y.o. male with complicated diverticulitis with  abscess   s/p percutaneous drainage, complicated by pertinent comorbidities including hypertension, hypothyroidism, history of TIA, Crohn artery disease, type 2 diabetes mellitus, history of pulmonary embolism on anticoagulation.  Patient evaluated today.  He continues to improve slowly.  White blood cell continue slowly decreasing trend.  Physical exam is unremarkable.  I agree with considering transition to oral antibiotic to Levaquin.  Agree with recommendation of repeating CT scan in a week.  Continue medical management as per primary team.  We will continue to follow with you.  9.3(Z, MD

## 2021-09-07 ENCOUNTER — Ambulatory Visit: Payer: Medicare Other

## 2021-09-07 DIAGNOSIS — K5720 Diverticulitis of large intestine with perforation and abscess without bleeding: Secondary | ICD-10-CM | POA: Diagnosis not present

## 2021-09-07 DIAGNOSIS — R1032 Left lower quadrant pain: Secondary | ICD-10-CM | POA: Diagnosis not present

## 2021-09-07 DIAGNOSIS — K651 Peritoneal abscess: Secondary | ICD-10-CM | POA: Diagnosis not present

## 2021-09-07 DIAGNOSIS — I2782 Chronic pulmonary embolism: Secondary | ICD-10-CM

## 2021-09-07 LAB — CBC WITH DIFFERENTIAL/PLATELET
Abs Immature Granulocytes: 0.88 10*3/uL — ABNORMAL HIGH (ref 0.00–0.07)
Basophils Absolute: 0.2 10*3/uL — ABNORMAL HIGH (ref 0.0–0.1)
Basophils Relative: 2 %
Eosinophils Absolute: 0.7 10*3/uL — ABNORMAL HIGH (ref 0.0–0.5)
Eosinophils Relative: 4 %
HCT: 35.6 % — ABNORMAL LOW (ref 39.0–52.0)
Hemoglobin: 11.5 g/dL — ABNORMAL LOW (ref 13.0–17.0)
Immature Granulocytes: 5 %
Lymphocytes Relative: 14 %
Lymphs Abs: 2.4 10*3/uL (ref 0.7–4.0)
MCH: 29.8 pg (ref 26.0–34.0)
MCHC: 32.3 g/dL (ref 30.0–36.0)
MCV: 92.2 fL (ref 80.0–100.0)
Monocytes Absolute: 1.2 10*3/uL — ABNORMAL HIGH (ref 0.1–1.0)
Monocytes Relative: 7 %
Neutro Abs: 11.1 10*3/uL — ABNORMAL HIGH (ref 1.7–7.7)
Neutrophils Relative %: 68 %
Platelets: 388 10*3/uL (ref 150–400)
RBC: 3.86 MIL/uL — ABNORMAL LOW (ref 4.22–5.81)
RDW: 13.9 % (ref 11.5–15.5)
WBC: 16.5 10*3/uL — ABNORMAL HIGH (ref 4.0–10.5)
nRBC: 0 % (ref 0.0–0.2)

## 2021-09-07 LAB — HEPARIN LEVEL (UNFRACTIONATED): Heparin Unfractionated: 0.54 IU/mL (ref 0.30–0.70)

## 2021-09-07 MED ORDER — SODIUM CHLORIDE 0.9 % IV SOLN: INTRAVENOUS | Status: DC | PRN

## 2021-09-07 MED ORDER — APIXABAN 5 MG PO TABS
5.0000 mg | ORAL_TABLET | Freq: Two times a day (BID) | ORAL | Status: DC
  Administered 2021-09-07 – 2021-09-08 (×2): 5 mg via ORAL
  Filled 2021-09-07 (×2): qty 1

## 2021-09-07 MED ORDER — ACETAMINOPHEN 325 MG PO TABS: 650.0000 mg | ORAL_TABLET | Freq: Four times a day (QID) | ORAL | Status: DC | PRN

## 2021-09-07 MED ORDER — ACETAMINOPHEN 650 MG RE SUPP: 650.0000 mg | Freq: Four times a day (QID) | RECTAL | Status: DC | PRN

## 2021-09-07 NOTE — Care Management (Signed)
TOC assessment complete Full note to follow.  Referral made to  [facility] for home IV antibiotics

## 2021-09-07 NOTE — Progress Notes (Signed)
ANTICOAGULATION CONSULT NOTE  Pharmacy Consult for heparin post IR procedure Indication: pulmonary embolus  Patient Measurements: Height: 5\' 7"  (170.2 cm) Weight: 81.6 kg (180 lb) IBW/kg (Calculated) : 66.1 Heparin Dosing Weight: 81 kg  Labs: Recent Labs    09/05/21 0601 09/05/21 1708 09/06/21 0302 09/06/21 0459 09/06/21 1305 09/07/21 0425  HGB 13.0  --  12.1*  --   --  11.5*  HCT 39.7  --  37.2*  --   --  35.6*  PLT 351  --  384  --   --  388  HEPARINUNFRC 0.75*   < >  --  0.55 0.60 0.54  CREATININE 1.21  --  1.02  --   --   --    < > = values in this interval not displayed.     Estimated Creatinine Clearance: 62 mL/min (by C-G formula based on SCr of 1.02 mg/dL).   Medical History: Past Medical History:  Diagnosis Date   Diverticulitis    Enlarged prostate    Gallstones    Hypertension    Hyperthyroidism      Assessment: 75 year old male with abdominal pain sent for possible PE on outpatient CT imaging done 12/22. No anticoagulation noted PTA. Pharmacy consulted for restarting heparin post IR procedure with standard bleeding risk.  Date Time HL Rate/comment 1224 0553 0.14 1300 un/hr, subtherapeutic  1224 1501 0.28 1600 un/hr, subtherapeutic 1225 0106   0.52     1750 un/hr, therapeutic x 1  1225 0842 0.77 1750 un/hr, supratherapeutic 1225 1756 0.60 1700 un/hr, therapeutic x 1 1226    0243   0.77      1700 unit/hr, supratherapeutic  1226    1306   0.60      1600 unit/hr, therapeutic x 1 1226    2106   0.64      1600 unit/hr, therapeutic x 2 1227 0601 0.75  Supratherapeutic 1227 1708 0.72  Supratherapeutic  1228 0459 0.55 Therapeutic x 1 1229 0425 0.54 Therapeutic x 3  Goal of Therapy:  Heparin level 0.3-0.7 units/ml Monitor platelets by anticoagulation protocol: Yes   Plan:  Continue heparin drip rate at 1400 units/hr. Will recheck HL daily with AM labs CBC daily while on heparin drip.  2107, PharmD, [facility] 09/07/2021 6:40  AM

## 2021-09-07 NOTE — Care Management Important Message (Signed)
Important Message  Patient Details  Name: Todd DEPOY Sr. MRN: [id number] Date of Birth: 1946/06/11   Medicare Important Message Given:  Yes     [name] 09/07/2021, 11:28 AM

## 2021-09-07 NOTE — Progress Notes (Signed)
Progress Note    Todd BAZE Sr.  HKV:[id number] DOB: 02/07/1946  DOA: 08/31/2021 PCP: Barbette Reichmann, MD      Brief Narrative:    Medical records reviewed and are as summarized below:  Todd E Schwinn Sr. is a 75 y.o. male with medical history significant for diverticulitis, BPH, gallstones, hypertension, hyperthyroidism, COVID-19 infection, who presented to the hospital with abdominal pain.      Assessment/Plan:   Principal Problem:   Abdominal pain Active Problems:   TIA (transient ischemic attack)   CAD (coronary artery disease)   Benign essential hypertension   Type 2 diabetes mellitus (HCC)   Abdominopelvic abscess (HCC)   Pulmonary embolism (HCC)   Diverticulitis of large intestine with abscess without bleeding    Body mass index is 28.19 kg/m.  Sepsis secondary to acute complicated diverticulitis with abscess: S/p abdominal drain by IR on 09/01/2021.  Continue IV meropenem through 09/14/2021.  Culture from diverticular abscess drain revealed Enterobacter aerogenes, E. coli, Pseudomonas aeruginosa, Bacteroides species.   Midline catheter has been ordered for IV antibiotics.  It was recommended that patient be discharged home to complete his antibiotics.  However, patient said he is mostly home alone and his home situation does not make it conducive for him to have the IV antibiotics at home.  He prefers to stay in the hospital to complete antibiotics.  Plan of care discussed with Dr. Joylene Draft, ID and Dr. Everlene Farrier, general surgeon, via secure chat.  Chronic pulmonary embolism: Change IV heparin to oral Eliquis.  Long-term side effect of Eliquis risks, benefits and alternatives to long-term anticoagulation was discussed with the patient and he agrees to proceed with treatment.  Surgical team was notified that patient is starting Eliquis today.  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  Continue antitussives.  Diet Order             DIET SOFT Room service appropriate? Yes; Fluid consistency: Thin  Diet effective now                      Consultants: General surgeon Infectious disease  Procedures: Abdominal drain by IR on 09/01/2021    Medications:    atorvastatin  80 mg Oral QHS   isosorbide mononitrate  60 mg Oral Daily   metoprolol succinate  12.5 mg Oral Daily   polyethylene glycol  34 g Oral BID   propylthiouracil  25 mg Oral BID   sodium chloride flush  3 mL Intravenous Q12H   sodium chloride flush  5 mL Intracatheter Q8H   Continuous Infusions:  heparin 1,400 Units/hr (09/07/21 1200)   meropenem (MERREM) IV 1 g (09/07/21 1359)     Anti-infectives (From admission, onward)    Start     Dose/Rate Route Frequency Ordered Stop   09/04/21 1800  meropenem (MERREM) 1 g in sodium chloride 0.9 % 100 mL IVPB        1 g 200 mL/hr over 30 Minutes Intravenous Every 8 hours 09/04/21 1618     09/02/21 0100  piperacillin-tazobactam (ZOSYN) IVPB 3.375 g  Status:  Discontinued        3.375 g 12.5 mL/hr over 240 Minutes Intravenous Every 8 hours 09/01/21 2158 09/04/21 1616   09/01/21 1400  piperacillin-tazobactam (ZOSYN) IVPB 3.375 g  Status:  Discontinued        3.375 g 12.5 mL/hr over 240 Minutes Intravenous Every 8  hours 09/01/21 1029 09/01/21 2158   08/31/21 2100  piperacillin-tazobactam (ZOSYN) IVPB 3.375 g  Status:  Discontinued        3.375 g 100 mL/hr over 30 Minutes Intravenous Every 6 hours 08/31/21 1948 09/01/21 1029   08/31/21 1800  cefTRIAXone (ROCEPHIN) 1 g in sodium chloride 0.9 % 100 mL IVPB        1 g 200 mL/hr over 30 Minutes Intravenous  Once 08/31/21 1758 08/31/21 1912   08/31/21 1800  metroNIDAZOLE (FLAGYL) IVPB 500 mg        500 mg 100 mL/hr over 60 Minutes Intravenous  Once 08/31/21 1758 08/31/21 1928              Family Communication/Anticipated D/C date and plan/Code Status   DVT  prophylaxis: SCDs Start: 08/31/21 1938     Code Status: Full Code  Family Communication: None Disposition Plan: Plan to discharge home after completion of IV antibiotics.  Status is: Inpatient  Remains inpatient appropriate because: On IV antibiotics           Subjective:   No abdominal pain or vomiting.  He feels better today.  Objective:    Vitals:   09/06/21 1520 09/06/21 2021 09/07/21 0519 09/07/21 0728  BP: (!) 100/55 (!) 115/54 114/65 100/77  Pulse: 83 83 71 75  Resp: 18 18 20 17   Temp: 97.7 F (36.5 C) (!) 97.5 F (36.4 C) 98.2 F (36.8 C) 97.7 F (36.5 C)  TempSrc: Oral     SpO2: 93% 93% 92% 95%  Weight:      Height:       No data found.   Intake/Output Summary (Last 24 hours) at 09/07/2021 1514 Last data filed at 09/07/2021 1428 Gross per 24 hour  Intake 980 ml  Output 430 ml  Net 550 ml   Filed Weights   08/31/21 1747  Weight: 81.6 kg    Exam:  GEN: NAD SKIN: No rash EYES: EOMI ENT: MMM CV: RRR PULM: CTA B ABD: soft, ND, NT, +BS, + abdominal drain with small amount of feculent drainage CNS: AAO x 3, non focal EXT: No edema or tenderness       Data Reviewed:   I have personally reviewed following labs and imaging studies:  Labs: Labs show the following:   Basic Metabolic Panel: Recent Labs  Lab 09/02/21 0419 09/03/21 0842 09/04/21 0243 09/05/21 0601 09/06/21 0302  NA 134* 132* 130* 136 137  K 3.9 3.8 3.6 3.5 4.2  CL 105 102 98 102 104  CO2 23 21* 26 24 27   GLUCOSE 86 106* 113* 124* 123*  BUN 11 9 10 11 20   CREATININE 0.79 1.15 1.23 1.21 1.02  CALCIUM 8.6* 8.5* 8.8* 8.6* 8.9  MG 2.1 2.0 2.2 2.1 2.2   GFR Estimated Creatinine Clearance: 62 mL/min (by C-G formula based on SCr of 1.02 mg/dL). Liver Function Tests: Recent Labs  Lab 08/31/21 1748 09/01/21 0500  AST 37 31  ALT 52* 43  ALKPHOS 106 84  BILITOT 0.8 0.9  PROT 8.2* 6.8  ALBUMIN 3.2* 2.5*   Recent Labs  Lab 08/31/21 1748  LIPASE 44   No  results for input(s): AMMONIA in the last 168 hours. Coagulation profile Recent Labs  Lab 08/31/21 1748  INR 1.0    CBC: Recent Labs  Lab 09/03/21 0842 09/04/21 0243 09/05/21 0601 09/06/21 0302 09/07/21 0425  WBC 14.5* 16.9* 14.5* 13.6* 16.5*  NEUTROABS  --   --   --   --  11.1*  HGB 13.8 13.3 13.0 12.1* 11.5*  HCT 41.8 40.2 39.7 37.2* 35.6*  MCV 88.7 88.7 89.4 91.0 92.2  PLT 335 355 351 384 388   Cardiac Enzymes: No results for input(s): CKTOTAL, CKMB, CKMBINDEX, TROPONINI in the last 168 hours. BNP (last 3 results) No results for input(s): PROBNP in the last 8760 hours. CBG: Recent Labs  Lab 09/01/21 2206 09/02/21 0909 09/02/21 1250 09/02/21 1723 09/02/21 2128  GLUCAP 84 104* 129* 95 105*   D-Dimer: No results for input(s): DDIMER in the last 72 hours. Hgb A1c: No results for input(s): HGBA1C in the last 72 hours. Lipid Profile: No results for input(s): CHOL, HDL, LDLCALC, TRIG, CHOLHDL, LDLDIRECT in the last 72 hours. Thyroid function studies: No results for input(s): TSH, T4TOTAL, T3FREE, THYROIDAB in the last 72 hours.  Invalid input(s): FREET3 Anemia work up: No results for input(s): VITAMINB12, FOLATE, FERRITIN, TIBC, IRON, RETICCTPCT in the last 72 hours. Sepsis Labs: Recent Labs  Lab 08/31/21 1810 09/01/21 0500 09/04/21 0243 09/05/21 0601 09/06/21 0302 09/07/21 0425  WBC  --    < > 16.9* 14.5* 13.6* 16.5*  LATICACIDVEN 1.3  --   --   --   --   --    < > = values in this interval not displayed.    Microbiology Recent Results (from the past 240 hour(s))  Resp Panel by RT-PCR (Flu A&B, Covid) Nasopharyngeal Swab     Status: None   Collection Time: 08/31/21  6:10 PM   Specimen: Nasopharyngeal Swab; Nasopharyngeal(NP) swabs in vial transport medium  Result Value Ref Range Status   SARS Coronavirus 2 by RT PCR NEGATIVE NEGATIVE Final    Comment: (NOTE) SARS-CoV-2 target nucleic acids are NOT DETECTED.  The SARS-CoV-2 RNA is generally  detectable in upper respiratory specimens during the acute phase of infection. The lowest concentration of SARS-CoV-2 viral copies this assay can detect is 138 copies/mL. A negative result does not preclude SARS-Cov-2 infection and should not be used as the sole basis for treatment or other patient management decisions. A negative result may occur with  improper specimen collection/handling, submission of specimen other than nasopharyngeal swab, presence of viral mutation(s) within the areas targeted by this assay, and inadequate number of viral copies(<138 copies/mL). A negative result must be combined with clinical observations, patient history, and epidemiological information. The expected result is Negative.  Fact Sheet for Patients:  BloggerCourse.com  Fact Sheet for Healthcare Providers:  SeriousBroker.it  This test is no t yet approved or cleared by the [place] FDA and  has been authorized for detection and/or diagnosis of SARS-CoV-2 by FDA under an Emergency Use Authorization (EUA). This EUA will remain  in effect (meaning this test can be used) for the duration of the COVID-19 declaration under Section 564(b)(1) of the Act, 21 U.S.C.section 360bbb-3(b)(1), unless the authorization is terminated  or revoked sooner.       Influenza A by PCR NEGATIVE NEGATIVE Final   Influenza B by PCR NEGATIVE NEGATIVE Final    Comment: (NOTE) The Xpert Xpress SARS-CoV-2/FLU/RSV plus assay is intended as an aid in the diagnosis of influenza from Nasopharyngeal swab specimens and should not be used as a sole basis for treatment. Nasal washings and aspirates are unacceptable for Xpert Xpress SARS-CoV-2/FLU/RSV testing.  Fact Sheet for Patients: BloggerCourse.com  Fact Sheet for Healthcare Providers: SeriousBroker.it  This test is not yet approved or cleared by the [place] FDA  and has been authorized for detection and/or diagnosis of  SARS-CoV-2 by FDA under an Emergency Use Authorization (EUA). This EUA will remain in effect (meaning this test can be used) for the duration of the COVID-19 declaration under Section 564(b)(1) of the Act, 21 U.S.C. section 360bbb-3(b)(1), unless the authorization is terminated or revoked.  Performed at [facility], [address] [place], [place] [address]   Aerobic/Anaerobic Culture w Gram Stain (surgical/deep wound)     Status: None   Collection Time: 09/01/21  2:10 PM   Specimen: Wound; Abscess  Result Value Ref Range Status   Specimen Description   Final    WOUND Performed at [facility], [address] [place], [place] [address]    Special Requests   Final    [facility] ABSCESS Performed at [facility], [address] [place], [place] [address]    Gram Stain   Final    ABUNDANT WBC PRESENT, PREDOMINANTLY MONONUCLEAR FEW GRAM NEGATIVE RODS    Culture   Final    MODERATE ENTEROBACTER AEROGENES RARE ESCHERICHIA COLI RARE PSEUDOMONAS AERUGINOSA RARE BACTEROIDES DISTASONIS BETA LACTAMASE POSITIVE Performed at [facility], [address], [place], [place] [address]    Report Status 09/05/2021 FINAL  Final   Organism ID, Bacteria ENTEROBACTER AEROGENES  Final   Organism ID, Bacteria ESCHERICHIA COLI  Final   Organism ID, Bacteria PSEUDOMONAS AERUGINOSA  Final      Susceptibility   Enterobacter aerogenes - MIC*    CEFAZOLIN >=64 RESISTANT Resistant     CEFEPIME 0.5 SENSITIVE Sensitive     CEFTAZIDIME >=64 RESISTANT Resistant     CEFTRIAXONE >=64 RESISTANT Resistant     CIPROFLOXACIN <=0.25 SENSITIVE Sensitive     GENTAMICIN <=1 SENSITIVE Sensitive     IMIPENEM <=0.25 SENSITIVE Sensitive     TRIMETH/SULFA <=20 SENSITIVE Sensitive     PIP/TAZO >=128 RESISTANT Resistant     * MODERATE ENTEROBACTER AEROGENES   Escherichia coli - MIC*    AMPICILLIN 4 SENSITIVE Sensitive      CEFAZOLIN <=4 SENSITIVE Sensitive     CEFEPIME <=0.12 SENSITIVE Sensitive     CEFTAZIDIME <=1 SENSITIVE Sensitive     CEFTRIAXONE <=0.25 SENSITIVE Sensitive     CIPROFLOXACIN <=0.25 SENSITIVE Sensitive     GENTAMICIN <=1 SENSITIVE Sensitive     IMIPENEM <=0.25 SENSITIVE Sensitive     TRIMETH/SULFA <=20 SENSITIVE Sensitive     AMPICILLIN/SULBACTAM <=2 SENSITIVE Sensitive     PIP/TAZO <=4 SENSITIVE Sensitive     * RARE ESCHERICHIA COLI   Pseudomonas aeruginosa - MIC*    CEFTAZIDIME <=1 SENSITIVE Sensitive     CIPROFLOXACIN <=0.25 SENSITIVE Sensitive     GENTAMICIN <=1 SENSITIVE Sensitive     IMIPENEM 2 SENSITIVE Sensitive     PIP/TAZO <=4 SENSITIVE Sensitive     CEFEPIME 1 SENSITIVE Sensitive     * RARE PSEUDOMONAS AERUGINOSA    Procedures and diagnostic studies:  No results found.             LOS: 7 days   Jule Schlabach  [facility]   Pager on [email or web address]. If 7PM-7AM, please contact night-coverage at [email or web address]     09/07/2021, 3:14 PM

## 2021-09-07 NOTE — Progress Notes (Signed)
[facility] SURGICAL PROGRESS NOTE (cpt [phone])  Hospital Day(s): 7.   Interval History: Patient seen and examined, no acute events or new complaints overnight. Patient reports he is feeling very good this morning. He denied any fever, chills, nausea, emesis, or abdominal pain. He continues to have a leukocytosis; up this morning again to 16.5K (from 13.6K yesterday). Surgical drain with a total of 30 ccs recorded in last 24 hours: small amount of feculent vs seropurulent output in drain. He continues on Meropenem; Infectious disease is following. He is on soft diet: tolerating this without issues.   Review of Systems:  Constitutional: denies fever, chills  HEENT: denies cough or congestion  Respiratory: denies any shortness of breath  Cardiovascular: denies chest pain or palpitations  Gastrointestinal: denies abdominal pain, N/V, or diarrhea Genitourinary: denies burning with urination or urinary frequency Musculoskeletal: denies pain, decreased motor or sensation  Vital signs in last 24 hours: [min-max] current  Temp:  [97.5 F (36.4 C)-98.2 F (36.8 C)] 98.2 F (36.8 C) (12/29 0519) Pulse Rate:  [71-83] 71 (12/29 0519) Resp:  [18-20] 20 (12/29 0519) BP: (100-122)/(54-73) 114/65 (12/29 0519) SpO2:  [92 %-95 %] 92 % (12/29 0519)     Height: 5\' 7"  (170.2 cm) Weight: 81.6 kg BMI (Calculated): 28.19   Intake/Output last 2 shifts:  12/28 0701 - 12/29 0700 In: 850 [P.O.:840] Out: 780 [Urine:750; Drains:30]   Physical Exam:  Constitutional: alert, cooperative and no distress  HENT: normocephalic without obvious abnormality  Eyes: PERRL, EOM's grossly intact and symmetric  Respiratory: breathing non-labored at rest  Cardiovascular: regular rate and sinus rhythm  Gastrointestinal: soft, non-tender, and non-distended, no rebound/guarding. Percutaneous drains in LLQ x2; output minimal and feculent vs seropurulent Musculoskeletal: no edema or wounds, motor and sensation  grossly intact, NT    Labs:  CBC Latest Ref Rng & Units 09/07/2021 09/06/2021 09/05/2021  WBC 4.0 - 10.5 K/uL 16.5(H) 13.6(H) 14.5(H)  Hemoglobin 13.0 - 17.0 g/dL 11.5(L) 12.1(L) 13.0  Hematocrit 39.0 - 52.0 % 35.6(L) 37.2(L) 39.7  Platelets 150 - 400 K/uL 388 384 351   CMP Latest Ref Rng & Units 09/06/2021 09/05/2021 09/04/2021  Glucose 70 - 99 mg/dL 09/06/2021) 235(T) 614(E)  BUN 8 - 23 mg/dL 20 11 10   Creatinine 0.61 - 1.24 mg/dL 315(Q 0.08  Sodium 135 - 145 mmol/L 137 136 130(L)  Potassium 3.5 - 5.1 mmol/L 4.2 3.5 3.6  Chloride 98 - 111 mmol/L 104 102 98  CO2 22 - 32 mmol/L 27 24 26   Calcium 8.9 - 10.3 mg/dL 8.9 6.76) 1.95)  Total Protein 6.5 - 8.1 g/dL - - -  Total Bilirubin 0.3 - 1.2 mg/dL - - -  Alkaline Phos 38 - 126 U/L - - -  AST 15 - 41 U/L - - -  ALT 0 - 44 U/L - - -    Imaging studies: No new pertinent imaging studies   Assessment/Plan: (ICD-10's: K26.92) 75 y.o. male with persistent leukocytosis admitted with complicated diverticulitis with intra-abdominal abscess s/p percutaneous drain placement x2, complicated by small PEs.   - Okay to continue diet as tolerated  - Continue IV Abx (Meropenem); infectious disease following; ? Plan to continue IV Abx at home; will need PICC if that is the case   - Monitor leukocytosis; up today; has been fluctuating    - Monitor abdominal examination - Monitor drain output; record    - Pain control prn; antiemetics prn - Mobilization as tolerated - Likely need repeat CT  Abdomen/Pelvis in a week or so  - Further management per primary service   All of the above findings and recommendations were discussed with the patient, and the medical team, and all of patient's questions were answered to his expressed satisfaction.  -- Edison Simon, PA-C [facility] 09/07/2021, 7:27 AM [phone] M-F: 7am - 4pm

## 2021-09-07 NOTE — Progress Notes (Signed)
Patient educated on how to flush chole drain

## 2021-09-08 DIAGNOSIS — K651 Peritoneal abscess: Secondary | ICD-10-CM | POA: Diagnosis not present

## 2021-09-08 DIAGNOSIS — K5720 Diverticulitis of large intestine with perforation and abscess without bleeding: Secondary | ICD-10-CM | POA: Diagnosis not present

## 2021-09-08 LAB — CBC
HCT: 32.4 % — ABNORMAL LOW (ref 39.0–52.0)
Hemoglobin: 10.7 g/dL — ABNORMAL LOW (ref 13.0–17.0)
MCH: 30.3 pg (ref 26.0–34.0)
MCHC: 33 g/dL (ref 30.0–36.0)
MCV: 91.8 fL (ref 80.0–100.0)
Platelets: 395 10*3/uL (ref 150–400)
RBC: 3.53 MIL/uL — ABNORMAL LOW (ref 4.22–5.81)
RDW: 14.1 % (ref 11.5–15.5)
WBC: 15.2 10*3/uL — ABNORMAL HIGH (ref 4.0–10.5)
nRBC: 0.1 % (ref 0.0–0.2)

## 2021-09-08 MED ORDER — MEROPENEM IV (FOR PTA / DISCHARGE USE ONLY): 1.0000 g | Freq: Three times a day (TID) | INTRAVENOUS | 0 refills | Status: AC

## 2021-09-08 MED ORDER — APIXABAN 5 MG PO TABS: 5.0000 mg | ORAL_TABLET | Freq: Two times a day (BID) | ORAL | 0 refills | Status: DC

## 2021-09-08 MED ORDER — HYDROCODONE BIT-HOMATROP MBR 5-1.5 MG/5ML PO SOLN
5.0000 mL | Freq: Four times a day (QID) | ORAL | 0 refills | Status: DC | PRN
Start: 2021-09-08 — End: 2022-02-06

## 2021-09-08 MED ORDER — SODIUM CHLORIDE 0.9 % IV SOLN: 1.0000 g | Freq: Three times a day (TID) | INTRAVENOUS | Status: DC

## 2021-09-08 NOTE — TOC Initial Note (Signed)
Transition of Care (TOC) - Initial/Assessment Note    Patient Details  Name: Todd BOSWELL Sr. MRN: [id number] Date of Birth: 11-09-1945  Transition of Care [facility]) CM/SW Contact:    Chapman Fitch, RN Phone Number: 09/08/2021, 10:37 AM  Clinical Narrative:                  Patient admitted with abdominal pain.  S/p surgical drain placement.  Drain education provided to patient by RN  Patient lives at home with wife PCP Hande Patient to discharge with home IV antibiotics  Referral made to [facility] with [facility] and she is to arrange nursing services through [place].  Pam to meet with patient and daughter today at bedside for education       Patient Goals and CMS Choice        Expected Discharge Plan and Services                                                Prior Living Arrangements/Services                       Activities of Daily Living Home Assistive Devices/Equipment: None ADL Screening (condition at time of admission) Patient's cognitive ability adequate to safely complete daily activities?: Yes Is the patient deaf or have difficulty hearing?: Yes Does the patient have difficulty seeing, even when wearing glasses/contacts?: No Does the patient have difficulty concentrating, remembering, or making decisions?: No Patient able to express need for assistance with ADLs?: Yes Does the patient have difficulty dressing or bathing?: No Independently performs ADLs?: Yes (appropriate for developmental age) Does the patient have difficulty walking or climbing stairs?: No Weakness of Legs: None Weakness of Arms/Hands: None  Permission Sought/Granted                  Emotional Assessment              Admission diagnosis:  Abscess [L02.91] Abdominopelvic abscess (HCC) [K65.1] Abdominal pain [R10.9] Diverticulitis of large intestine with abscess without bleeding [K57.20] Patient Active Problem List   Diagnosis Date Noted    Diverticulitis of large intestine with abscess without bleeding    CAD (coronary artery disease) 08/31/2021   Type 2 diabetes mellitus (HCC) 08/31/2021   Abdominal pain 08/31/2021   Abdominopelvic abscess (HCC) 08/31/2021   Pulmonary embolism (HCC) 08/31/2021   TIA (transient ischemic attack) 12/22/2015   Benign essential hypertension 09/16/2015   Graves' disease 10/06/2014   PCP:  Barbette Reichmann, MD Pharmacy:   [facility] [address] [place], [place] - [address] [facility] [address] [place] [address] Phone: [phone] Fax: [phone]  [id number] - [place], [place] - [address] [place] [address] Phone: [phone] Fax: [phone]     Social Determinants of Health (SDOH) Interventions    Readmission Risk Interventions No flowsheet data found.

## 2021-09-08 NOTE — Progress Notes (Signed)
Pharmacy Antibiotic Note  GAY RAPE Sr. is a 75 y.o. male admitted on 08/31/2021 with  diverticular abscess  s/p IR drain placement 12/23.  Pharmacy has been consulted for meropenem dosing.  12/23 diverticular abscess cx:  E aerogenes (Resistant to piperacillin/tazobactam) E coli (Pan-susc) Pseudomonas aeruginosa (Pan-susc)  Plan: Continue Meropenem 1gm IV q8h until 09/14/21 per ID recommendation Monitor renal function and adjust dose as clinically indicated  Height: 5\' 7"  (170.2 cm) Weight: 81.6 kg (180 lb) IBW/kg (Calculated) : 66.1  Temp (24hrs), Avg:98.1 F (36.7 C), Min:97.7 F (36.5 C), Max:98.6 F (37 C)  Recent Labs  Lab 09/02/21 0419 09/03/21 0842 09/04/21 0243 09/05/21 0601 09/06/21 0302 09/07/21 0425 09/08/21 0540  WBC 11.5* 14.5* 16.9* 14.5* 13.6* 16.5* 15.2*  CREATININE 0.79 1.15 1.23 1.21 1.02  --   --      Estimated Creatinine Clearance: 62 mL/min (by C-G formula based on SCr of 1.02 mg/dL).    Allergies  Allergen Reactions   Ciprofloxacin Rash    Antimicrobials this admission: 12/22 CRO/flagyl x1 12/22 Zosyn >> 12/26 12/26 meropenem >>  Microbiology results: 12/23 diveriticular abscess cx:  E aerogenes (Resistant to piperacillin/tazobactam) E coli (Pan-susc) Pseudomonas aeruginosa (Pan-susc) Possible anaerobe  Thank you for allowing pharmacy to be a part of this patients care.  1/24, PharmD Clinical Pharmacist  09/08/2021 8:13 AM

## 2021-09-08 NOTE — TOC Transition Note (Signed)
Transition of Care (TOC) - CM/SW Discharge Note   Patient Details  Name: Todd WARNE Sr. MRN: [id number] Date of Birth: 11/02/1945  [facility]) CM/SW Contact:  Chapman Fitch, RN Phone Number: 09/08/2021, 12:37 PM   Clinical Narrative:     Patient to discharge today. Wife and daughter bed side for teaching with Pam from [facility]. Confirmed with Pam that Anast[facility] Fiedler will be providing nursing in the home        Patient Goals and CMS Choice        Discharge Placement                       Discharge Plan and Services                                     Social Determinants of Health (SDOH) Interventions     Readmission Risk Interventions No flowsheet data found.

## 2021-09-08 NOTE — Progress Notes (Signed)
Pt discharged per MD order. Midline flushed prior to discharge. Drain care instructions taught to both pt and his wife. Drain supplies provided. Discharge instructions reviewed with pt. Pt and wife verbalized understanding with all questions answered to pt satisfaction. Pt taken to car in wheelchair via staff.

## 2021-09-08 NOTE — Progress Notes (Addendum)
[facility] SURGICAL PROGRESS NOTE (cpt [id number])  Hospital Day(s): 8.   Interval History: Patient seen and examined, no acute events or new complaints overnight. Patient reports he continues to feel well. No fever, chills, nausea, emesis, or bowel changes. His leukocytosis is improved again this morning; down to 15.2K (from 16.5K; has been fluctuating in this range). Surgical drain with a total of 20 ccs recorded in last 24 hours: small amount of feculent vs seropurulent output in drain. He continues on Meropenem; Infectious disease is following. He is on soft diet: tolerating this without issues.   Review of Systems:  Constitutional: denies fever, chills  HEENT: denies cough or congestion  Respiratory: denies any shortness of breath  Cardiovascular: denies chest pain or palpitations  Gastrointestinal: denies abdominal pain, N/V, or diarrhea Genitourinary: denies burning with urination or urinary frequency Musculoskeletal: denies pain, decreased motor or sensation  Vital signs in last 24 hours: [min-max] current  Temp:  [97.7 F (36.5 C)-98.6 F (37 C)] 97.7 F (36.5 C) (12/30 0723) Pulse Rate:  [73-96] 73 (12/30 0723) Resp:  [16-20] 20 (12/30 0723) BP: (98-109)/(65-77) 98/65 (12/30 0723) SpO2:  [91 %-95 %] 91 % (12/30 0723)     Height: 5\' 7"  (170.2 cm) Weight: 81.6 kg BMI (Calculated): 28.19   Intake/Output last 2 shifts:  12/29 0701 - 12/30 0700 In: 1351.9 [P.O.:720; I.V.:10; IV Piggyback:591.9] Out: 340 [Urine:320; Drains:20]   Physical Exam:  Constitutional: alert, cooperative and no distress  HENT: normocephalic without obvious abnormality  Eyes: PERRL, EOM's grossly intact and symmetric  Respiratory: breathing non-labored at rest  Cardiovascular: regular rate and sinus rhythm  Gastrointestinal: soft, non-tender, and non-distended, no rebound/guarding. Percutaneous drains in LLQ x2; output minimal and feculent vs seropurulent Musculoskeletal: no edema or  wounds, motor and sensation grossly intact, NT    Labs:  CBC Latest Ref Rng & Units 09/08/2021 09/07/2021 09/06/2021  WBC 4.0 - 10.5 K/uL 15.2(H) 16.5(H) 13.6(H)  Hemoglobin 13.0 - 17.0 g/dL 10.7(L) 11.5(L) 12.1(L)  Hematocrit 39.0 - 52.0 % 32.4(L) 35.6(L) 37.2(L)  Platelets 150 - 400 K/uL 395 388 384   CMP Latest Ref Rng & Units 09/06/2021 09/05/2021 09/04/2021  Glucose 70 - 99 mg/dL 09/06/2021) 938(H) 829(H)  BUN 8 - 23 mg/dL 20 11 10   Creatinine 0.61 - 1.24 mg/dL 371(I 9.67  Sodium 135 - 145 mmol/L 137 136 130(L)  Potassium 3.5 - 5.1 mmol/L 4.2 3.5 3.6  Chloride 98 - 111 mmol/L 104 102 98  CO2 22 - 32 mmol/L 27 24 26   Calcium 8.9 - 10.3 mg/dL 8.9 8.93) 8.10)  Total Protein 6.5 - 8.1 g/dL - - -  Total Bilirubin 0.3 - 1.2 mg/dL - - -  Alkaline Phos 38 - 126 U/L - - -  AST 15 - 41 U/L - - -  ALT 0 - 44 U/L - - -    Imaging studies: No new pertinent imaging studies   Assessment/Plan: (ICD-10's: K15.92) 75 y.o. male with persistent leukocytosis admitted with complicated diverticulitis with intra-abdominal abscess s/p percutaneous drain placement x2, complicated by small PEs.   - Okay to continue diet as tolerated  - Continue IV Abx (Meropenem); infectious disease following; plan to continue IV Abx at home; PICC placed yesterday  - Monitor leukocytosis; up today; has been fluctuating    - Monitor abdominal examination - Monitor drain output; record    - Pain control prn; antiemetics prn - Mobilization as tolerated - Further management per primary service    -  No contraindication to discharge today from surgical standpoint. I will order CT Abdomen/Pelvis for Wednesday/Thursday of next week and will place follow up for the week after with Dr Christian Mate.   All of the above findings and recommendations were discussed with the patient, and the medical team, and all of patient's questions were answered to his expressed satisfaction.  -- Edison Simon, PA-C [facility] 09/08/2021, 7:27 AM [phone] M-F: 7am - 4pm

## 2021-09-08 NOTE — Discharge Summary (Addendum)
Physician Discharge Summary  Todd Hansen Sr. EYC:[id number] DOB: March 30, 1946 DOA: 08/31/2021  PCP: Tracie Harrier, MD  Admit date: 08/31/2021 Discharge date: 09/08/2021  Discharge disposition: Home health therapy   Recommendations for Outpatient Follow-Up:   Follow-up with PCP in 1 to 2 weeks. Follow-up with Dr. Christian Mate, general surgeon on 09/19/2021. CT abdomen and pelvis to be done prior to follow-up with general surgeon. Follow-up with pulmonologist for management of chronic pulmonary embolism was recommended.  Discharge Diagnosis:   Principal Problem:   Abdominal pain Active Problems:   TIA (transient ischemic attack)   CAD (coronary artery disease)   Benign essential hypertension   Type 2 diabetes mellitus (HCC)   Abdominopelvic abscess ([place])   Pulmonary embolism (HCC)   Diverticulitis of large intestine with abscess without bleeding    Discharge Condition: Stable.  Diet recommendation:  Diet Order             Diet - low sodium heart healthy           DIET SOFT Room service appropriate? Yes; Fluid consistency: Thin  Diet effective now                     Code Status: Full Code     Hospital Course:   Mr. Todd Hansen Sr. is a 75 y.o. male with medical history significant for diverticulitis, BPH, gallstones, hypertension, hyperthyroidism, COVID-19 infection, who presented to the hospital with nausea and abdominal pain.  He had been treated with Augmentin and Flagyl for 2 weeks prior to admission.  He was found to have diverticular abscesses.  He was treated with empiric IV antibiotics and analgesics.  CT-guided placement of 2 abdominal drains into the diverticular abscesses was performed by interventional radiologist on 09/01/2021.  ID recommended that patient will be discharged on IV meropenem through 09/14/2021.  A midline was placed in the left arm for long-term antibiotics.  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  Subsequently, he was switched to Eliquis for long-term anticoagulation.  Risks, benefits and alternatives to anticoagulation were discussed and he agreed to proceed with treatment with Eliquis.  His condition has improved and he is deemed stable for discharge to home today.  Discharge plan was discussed with the patient and his daughter, Todd Hansen, at the bedside.   Medical Consultants:   ID specialist General surgeon   Discharge Exam:    Vitals:   09/07/21 0728 09/07/21 1607 09/07/21 2011 09/08/21 0723  BP: 100/77 109/66 109/66 98/65  Pulse: 75 96 95 73  Resp: 17 16 20 20   Temp: 97.7 F (36.5 C) 97.9 F (36.6 C) 98.6 F (37 C) 97.7 F (36.5 C)  TempSrc:   Oral Oral  SpO2: 95% 95% 94% 91%  Weight:      Height:         GEN: NAD SKIN: Warm and dry EYES: No pallor or icterus ENT: MMM CV: RRR PULM: CTA B ABD: soft, ND, NT, +BS, +2 abdominal drains in the left lower quadrant CNS: AAO x 3, non focal EXT: No edema or tenderness   The results of significant diagnostics from this hospitalization (including imaging, microbiology, ancillary and laboratory) are listed below for reference.     Procedures and Diagnostic Studies:   CT Angio Chest Pulmonary Embolism (PE) W or WO Contrast  Result Date: 09/01/2021 CLINICAL DATA:  Possible pulmonary embolus visualized on the CT abdomen/pelvis performed 08/31/2021. EXAM: CT  ANGIOGRAPHY CHEST WITH CONTRAST TECHNIQUE: Multidetector CT imaging of the chest was performed using the standard protocol during bolus administration of intravenous contrast. Multiplanar CT image reconstructions and MIPs were obtained to evaluate the vascular anatomy. CONTRAST:  11m OMNIPAQUE IOHEXOL 350 MG/ML SOLN COMPARISON:  CT abdomen 08/31/2021 FINDINGS: Cardiovascular: Satisfactory opacification of the pulmonary arteries to the segmental level. Tiny subsegmental left lower lobe pulmonary emboli. Mural thrombus in the right lower  lobe subsegmental branches likely reflecting chronic pulmonary embolus. Normal heart size. No pericardial effusion. Thoracic aortic atherosclerosis. Mediastinum/Nodes: No enlarged mediastinal, hilar, or axillary lymph nodes. Thyroid gland, trachea, and esophagus demonstrate no significant findings. Lungs/Pleura: Lungs are clear. No pleural effusion or pneumothorax. Bibasilar atelectasis. Upper Abdomen: No acute upper abdominal abnormality. Musculoskeletal: No acute osseous abnormality. No aggressive osseous lesion. Anterior bridging osteophytes involving the mid and lower thoracic spine as can be seen with diffuse idiopathic skeletal hyperostosis. Review of the MIP images confirms the above findings. IMPRESSION: 1. Tiny subsegmental left lower lobe pulmonary emboli. Chronic right lower lobe pulmonary embolus. No right heart strain. 2.  Aortic Atherosclerosis (ICD10-I70.0). Electronically Signed   By: HKathreen DevoidM.D.   On: 09/01/2021 08:45   CT ABDOMEN PELVIS W CONTRAST  Result Date: 08/31/2021 CLINICAL DATA:  Lower abdominal pain with nausea, initial encounter EXAM: CT ABDOMEN AND PELVIS WITH CONTRAST TECHNIQUE: Multidetector CT imaging of the abdomen and pelvis was performed using the standard protocol following bolus administration of intravenous contrast. CONTRAST:  1026mOMNIPAQUE IOHEXOL 300 MG/ML  SOLN COMPARISON:  None. FINDINGS: Lower chest: Mild dependent atelectatic changes are noted. Eccentric filling defect is noted within the lower lobe pulmonary arterial branches on the right consistent with pulmonary embolism. Given its somewhat eccentric appearance this may be of a more subacute nature. Hepatobiliary: No focal liver abnormality is seen. No gallstones, gallbladder wall thickening, or biliary dilatation. Pancreas: Unremarkable. No pancreatic ductal dilatation or surrounding inflammatory changes. Spleen: Normal in size without focal abnormality. Adrenals/Urinary Tract: Adrenal glands are within  normal limits. Kidneys demonstrate a normal enhancement pattern bilaterally. Lower pole left renal cyst is noted measuring 2.9 cm. Nonobstructing 7 mm stone is noted in the lower pole of the right kidney. Ureters are within normal limits. The bladder is partially distended. Stomach/Bowel: Colon shows diverticular change without evidence of diverticulitis at the junction of the descending and sigmoid colon. A small intramural abscess is noted measuring approximately 19 mm. Adjacent to the sigmoid colon draped over by a multiple inflamed small bowel loops there is a 8.5 x 5.7 cm air-fluid collection consistent with focal abscess. Given the changes in the colon this is likely a diverticular abscess. Multiple smaller collections of air are noted interposed between this abscess and the sigmoid colon. Reactive inflammatory changes in the adjacent small bowel loops are noted. The more proximal colon is within normal limits. The appendix is unremarkable. Small bowel is otherwise within normal limits. The stomach is unremarkable. Vascular/Lymphatic: Aortic atherosclerosis. No enlarged abdominal or pelvic lymph nodes. Reproductive: Prostate is unremarkable. Other: No abdominal wall hernia or abnormality. No abdominopelvic ascites. Musculoskeletal: Degenerative changes of lumbar spine are noted. IMPRESSION: Changes consistent with large intra-abdominal abscess as described likely related to adjacent diverticulitis. A smaller intramural abscess is noted in the proximal sigmoid colon. Multiple extraluminal foci of air are noted also likely representing small abscesses. Some compensatory small-bowel wall thickening is noted related to local inflammation. Eccentric filling defect in the right lower lobe pulmonary arterial branch best seen on image number 1  of series 2. This is incompletely evaluated on this exam and likely represents a more subacute pulmonary embolism. CTA of the chest is recommended for further evaluation.  Nonobstructing lower pole right renal stone. These results will be called to the ordering clinician or representative by the Radiologist Assistant, and communication documented in the PACS or [employer]. Electronically Signed   By: Inez Catalina M.D.   On: 08/31/2021 17:08   DG Chest Portable 1 View  Result Date: 08/31/2021 CLINICAL DATA:  Hypoxia EXAM: PORTABLE CHEST 1 VIEW COMPARISON:  None FINDINGS: Cardiac shadow is within normal limits. Thoracic aortic calcifications are noted. Lungs are well aerated bilaterally. No focal infiltrate is noted. No bony abnormality is seen. IMPRESSION: No active disease. Electronically Signed   By: Inez Catalina M.D.   On: 08/31/2021 19:03   CT IMAGE GUIDED DRAINAGE BY PERCUTANEOUS CATHETER  Result Date: 09/01/2021 INDICATION: Diverticular abscesses. Please perform CT-guided aspiration and/or drainage catheter placement(s). EXAM: CT IMAGE GUIDED DRAINAGE BY PERCUTANEOUS CATHETER x2 COMPARISON:  CT abdomen pelvis-08/31/2021 MEDICATIONS: The patient is currently admitted to the hospital and receiving intravenous antibiotics. The antibiotics were administered within an appropriate time frame prior to the initiation of the procedure. ANESTHESIA/SEDATION: Moderate (conscious) sedation was employed during this procedure. A total of Versed 1 mg and Fentanyl 50 mcg was administered intravenously. Moderate Sedation Time: 16 minutes. The patient's level of consciousness and vital signs were monitored continuously by radiology nursing throughout the procedure under my direct supervision. CONTRAST:  None COMPLICATIONS: None immediate. PROCEDURE: Informed written consent was obtained from the patient after a discussion of the risks, benefits and alternatives to treatment. The patient was placed supine on the CT gantry and a pre procedural CT was performed re-demonstrating the known abscess/fluid collection within the left lower abdomen with dominant air and fluid containing  collection measuring approximately 8.3 x 4.3 cm (image 48, series 2) and smaller pericolonic collection measuring approximately 3.1 x 2.2 cm (image 51, series 2). The procedure was planned. A timeout was performed prior to the initiation of the procedure. The skin overlying the inferolateral aspect the left abdomen was prepped and draped in the usual sterile fashion. The overlying soft tissues were anesthetized with 1% lidocaine with epinephrine. 18 gauge trocar needles were utilized to target both collections separately, ultimately allowing coiling of short Amplatz wires within both collections. Appropriate positioning was confirmed with CT imaging. Next, both tracks were serially dilated allowing placement of 10 French all-purpose drainage catheters at both locations. Appropriate position was confirmed with CT imaging (series 9). Next, a total of approximately 120 cc of purulent fluid was aspirated from both collections. A representative sample of aspirated fluid was capped and sent to the laboratory for analysis. Both drainage catheters were connected to gravity bags and secured in place with interrupted sutures. Dressings were applied. The patient tolerated the procedure well without immediate post procedural complication. IMPRESSION: 1. Successful CT-guided placement a 10 French all-purpose drainage catheter into dominant collection within the left lower abdomen/upper pelvis 2. Successful CT-guided placement of a 10 French all-purpose drainage catheter into the smaller pericolonic diverticular abscess within the left lower abdomen 3. A total of approximately 120 cc of purulent fluid was aspirated from both collections. A representative sample of aspirated fluid was capped and sent to the laboratory for analysis. Electronically Signed   By: Sandi Mariscal M.D.   On: 09/01/2021 14:51   CT IMAGE GUIDED DRAINAGE BY PERCUTANEOUS CATHETER  Result Date: 09/01/2021 INDICATION: Diverticular abscesses. Please perform  CT-guided aspiration and/or drainage catheter placement(s). EXAM: CT IMAGE GUIDED DRAINAGE BY PERCUTANEOUS CATHETER x2 COMPARISON:  CT abdomen pelvis-08/31/2021 MEDICATIONS: The patient is currently admitted to the hospital and receiving intravenous antibiotics. The antibiotics were administered within an appropriate time frame prior to the initiation of the procedure. ANESTHESIA/SEDATION: Moderate (conscious) sedation was employed during this procedure. A total of Versed 1 mg and Fentanyl 50 mcg was administered intravenously. Moderate Sedation Time: 16 minutes. The patient's level of consciousness and vital signs were monitored continuously by radiology nursing throughout the procedure under my direct supervision. CONTRAST:  None COMPLICATIONS: None immediate. PROCEDURE: Informed written consent was obtained from the patient after a discussion of the risks, benefits and alternatives to treatment. The patient was placed supine on the CT gantry and a pre procedural CT was performed re-demonstrating the known abscess/fluid collection within the left lower abdomen with dominant air and fluid containing collection measuring approximately 8.3 x 4.3 cm (image 48, series 2) and smaller pericolonic collection measuring approximately 3.1 x 2.2 cm (image 51, series 2). The procedure was planned. A timeout was performed prior to the initiation of the procedure. The skin overlying the inferolateral aspect the left abdomen was prepped and draped in the usual sterile fashion. The overlying soft tissues were anesthetized with 1% lidocaine with epinephrine. 18 gauge trocar needles were utilized to target both collections separately, ultimately allowing coiling of short Amplatz wires within both collections. Appropriate positioning was confirmed with CT imaging. Next, both tracks were serially dilated allowing placement of 10 French all-purpose drainage catheters at both locations. Appropriate position was confirmed with CT imaging  (series 9). Next, a total of approximately 120 cc of purulent fluid was aspirated from both collections. A representative sample of aspirated fluid was capped and sent to the laboratory for analysis. Both drainage catheters were connected to gravity bags and secured in place with interrupted sutures. Dressings were applied. The patient tolerated the procedure well without immediate post procedural complication. IMPRESSION: 1. Successful CT-guided placement a 10 French all-purpose drainage catheter into dominant collection within the left lower abdomen/upper pelvis 2. Successful CT-guided placement of a 10 French all-purpose drainage catheter into the smaller pericolonic diverticular abscess within the left lower abdomen 3. A total of approximately 120 cc of purulent fluid was aspirated from both collections. A representative sample of aspirated fluid was capped and sent to the laboratory for analysis. Electronically Signed   By: Sandi Mariscal M.D.   On: 09/01/2021 14:51     Labs:   Basic Metabolic Panel: Recent Labs  Lab 09/02/21 0419 09/03/21 0842 09/04/21 0243 09/05/21 0601 09/06/21 0302  NA 134* 132* 130* 136 137  K 3.9 3.8 3.6 3.5 4.2  CL 105 102 98 102 104  CO2 23 21* 26 24 27   GLUCOSE 86 106* 113* 124* 123*  BUN 11 9 10 11 20   CREATININE 0.79 1.15 1.23 1.21 1.02  CALCIUM 8.6* 8.5* 8.8* 8.6* 8.9  MG 2.1 2.0 2.2 2.1 2.2   GFR Estimated Creatinine Clearance: 62 mL/min (by C-G formula based on SCr of 1.02 mg/dL). Liver Function Tests: No results for input(s): AST, ALT, ALKPHOS, BILITOT, PROT, ALBUMIN in the last 168 hours. No results for input(s): LIPASE, AMYLASE in the last 168 hours. No results for input(s): AMMONIA in the last 168 hours. Coagulation profile No results for input(s): INR, PROTIME in the last 168 hours.  CBC: Recent Labs  Lab 09/04/21 0243 09/05/21 0601 09/06/21 0302 09/07/21 0425 09/08/21 0540  WBC 16.9* 14.5*  13.6* 16.5* 15.2*  NEUTROABS  --   --   --   11.1*  --   HGB 13.3 13.0 12.1* 11.5* 10.7*  HCT 40.2 39.7 37.2* 35.6* 32.4*  MCV 88.7 89.4 91.0 92.2 91.8  PLT 355 351 384 388 395   Cardiac Enzymes: No results for input(s): CKTOTAL, CKMB, CKMBINDEX, TROPONINI in the last 168 hours. BNP: Invalid input(s): POCBNP CBG: Recent Labs  Lab 09/01/21 2206 09/02/21 0909 09/02/21 1250 09/02/21 1723 09/02/21 2128  GLUCAP 84 104* 129* 95 105*   D-Dimer No results for input(s): DDIMER in the last 72 hours. Hgb A1c No results for input(s): HGBA1C in the last 72 hours. Lipid Profile No results for input(s): CHOL, HDL, LDLCALC, TRIG, CHOLHDL, LDLDIRECT in the last 72 hours. Thyroid function studies No results for input(s): TSH, T4TOTAL, T3FREE, THYROIDAB in the last 72 hours.  Invalid input(s): FREET3 Anemia work up No results for input(s): VITAMINB12, FOLATE, FERRITIN, TIBC, IRON, RETICCTPCT in the last 72 hours. Microbiology Recent Results (from the past 240 hour(s))  Resp Panel by RT-PCR (Flu A&B, Covid) Nasopharyngeal Swab     Status: None   Collection Time: 08/31/21  6:10 PM   Specimen: Nasopharyngeal Swab; Nasopharyngeal(NP) swabs in vial transport medium  Result Value Ref Range Status   SARS Coronavirus 2 by RT PCR NEGATIVE NEGATIVE Final    Comment: (NOTE) SARS-CoV-2 target nucleic acids are NOT DETECTED.  The SARS-CoV-2 RNA is generally detectable in upper respiratory specimens during the acute phase of infection. The lowest concentration of SARS-CoV-2 viral copies this assay can detect is 138 copies/mL. A negative result does not preclude SARS-Cov-2 infection and should not be used as the sole basis for treatment or other patient management decisions. A negative result may occur with  improper specimen collection/handling, submission of specimen other than nasopharyngeal swab, presence of viral mutation(s) within the areas targeted by this assay, and inadequate number of viral copies(<138 copies/mL). A negative result  must be combined with clinical observations, patient history, and epidemiological information. The expected result is Negative.  Fact Sheet for Patients:  EntrepreneurPulse.com.au  Fact Sheet for Healthcare Providers:  IncredibleEmployment.be  This test is no t yet approved or cleared by the [place] FDA and  has been authorized for detection and/or diagnosis of SARS-CoV-2 by FDA under an Emergency Use Authorization (EUA). This EUA will remain  in effect (meaning this test can be used) for the duration of the COVID-19 declaration under Section 564(b)(1) of the Act, 21 U.S.C.section 360bbb-3(b)(1), unless the authorization is terminated  or revoked sooner.       Influenza A by PCR NEGATIVE NEGATIVE Final   Influenza B by PCR NEGATIVE NEGATIVE Final    Comment: (NOTE) The Xpert Xpress SARS-CoV-2/FLU/RSV plus assay is intended as an aid in the diagnosis of influenza from Nasopharyngeal swab specimens and should not be used as a sole basis for treatment. Nasal washings and aspirates are unacceptable for Xpert Xpress SARS-CoV-2/FLU/RSV testing.  Fact Sheet for Patients: EntrepreneurPulse.com.au  Fact Sheet for Healthcare Providers: IncredibleEmployment.be  This test is not yet approved or cleared by the [place] FDA and has been authorized for detection and/or diagnosis of SARS-CoV-2 by FDA under an Emergency Use Authorization (EUA). This EUA will remain in effect (meaning this test can be used) for the duration of the COVID-19 declaration under Section 564(b)(1) of the Act, 21 U.S.C. section 360bbb-3(b)(1), unless the authorization is terminated or revoked.  Performed at [facility], [address] [place],  [place] [address]   Aerobic/Anaerobic Culture w Gram Stain (surgical/deep wound)     Status: None   Collection Time: 09/01/21  2:10 PM   Specimen: Wound; Abscess  Result Value  Ref Range Status   Specimen Description   Final    WOUND Performed at [facility], [address] [place], [place] [address]    Special Requests   Final    [facility] ABSCESS Performed at [facility], [address] [place], [place] [address]    Gram Stain   Final    ABUNDANT WBC PRESENT, PREDOMINANTLY MONONUCLEAR FEW GRAM NEGATIVE RODS    Culture   Final    MODERATE ENTEROBACTER AEROGENES RARE ESCHERICHIA COLI RARE PSEUDOMONAS AERUGINOSA RARE BACTEROIDES DISTASONIS BETA LACTAMASE POSITIVE Performed at [facility], [place] [address] [place],  [address]    Report Status 09/05/2021 FINAL  Final   Organism ID, Bacteria ENTEROBACTER AEROGENES  Final   Organism ID, Bacteria ESCHERICHIA COLI  Final   Organism ID, Bacteria PSEUDOMONAS AERUGINOSA  Final      Susceptibility   Enterobacter aerogenes - MIC*    CEFAZOLIN >=64 RESISTANT Resistant     CEFEPIME 0.5 SENSITIVE Sensitive     CEFTAZIDIME >=64 RESISTANT Resistant     CEFTRIAXONE >=64 RESISTANT Resistant     CIPROFLOXACIN <=0.25 SENSITIVE Sensitive     GENTAMICIN <=1 SENSITIVE Sensitive     IMIPENEM <=0.25 SENSITIVE Sensitive     TRIMETH/SULFA <=20 SENSITIVE Sensitive     PIP/TAZO >=128 RESISTANT Resistant     * MODERATE ENTEROBACTER AEROGENES   Escherichia coli - MIC*    AMPICILLIN 4 SENSITIVE Sensitive     CEFAZOLIN <=4 SENSITIVE Sensitive     CEFEPIME <=0.12 SENSITIVE Sensitive     CEFTAZIDIME <=1 SENSITIVE Sensitive     CEFTRIAXONE <=0.25 SENSITIVE Sensitive     CIPROFLOXACIN <=0.25 SENSITIVE Sensitive     GENTAMICIN <=1 SENSITIVE Sensitive     IMIPENEM <=0.25 SENSITIVE Sensitive     TRIMETH/SULFA <=20 SENSITIVE Sensitive     AMPICILLIN/SULBACTAM <=2 SENSITIVE Sensitive     PIP/TAZO <=4 SENSITIVE Sensitive     * RARE ESCHERICHIA COLI   Pseudomonas aeruginosa - MIC*    CEFTAZIDIME <=1 SENSITIVE Sensitive     CIPROFLOXACIN <=0.25 SENSITIVE Sensitive     GENTAMICIN <=1 SENSITIVE  Sensitive     IMIPENEM 2 SENSITIVE Sensitive     PIP/TAZO <=4 SENSITIVE Sensitive     CEFEPIME 1 SENSITIVE Sensitive     * RARE PSEUDOMONAS AERUGINOSA     Discharge Instructions:   Discharge Instructions     [facility] pharmacist to adjust dose for Vancomycin, Aminoglycosides and other anti-infective therapies as requested by physician.   Complete by: As directed    [facility] to provide Cath Flo 434m   Complete by: As directed    Administer for PICC line occlusion and as ordered by physician for other access device issues.   Anaphylaxis Kit: Provided to treat any anaphylactic reaction to the medication being provided to the patient if First Dose or when requested by physician   Complete by: As directed    Epinephrine 184mml vial / amp: Administer 0.34m734m0.34ml29mubcutaneously once for moderate to severe anaphylaxis, nurse to call physician and pharmacy when reaction occurs and call 911 if needed for immediate care   Diphenhydramine 50mg81mIV vial: Administer 25-50mg 74mM PRN for first dose reaction, rash, itching, mild reaction, nurse to call physician and pharmacy when reaction occurs  Sodium Chloride 0.9% NS 574m IV: Administer if needed for hypovolemic blood pressure drop or as ordered by physician after call to physician with anaphylactic reaction   Change dressing on IV access line weekly and PRN   Complete by: As directed    Diet - low sodium heart healthy   Complete by: As directed    Flush IV access with Sodium Chloride 0.9% and Heparin 10 units/ml or 100 units/ml   Complete by: As directed    Home infusion instructions - [facility]   Complete by: As directed    Instructions: Flush IV access with Sodium Chloride 0.9% and Heparin 10units/ml or 100units/ml   Change dressing on IV access line: Weekly and PRN   Instructions Cath Flo 234m Administer for PICC Line occlusion and as ordered by physician for other access device   [facility] pharmacist to adjust dose for: Vancomycin, Aminoglycosides and other anti-infective therapies as requested by physician   Increase activity slowly   Complete by: As directed    Method of administration may be changed at the discretion of home infusion pharmacist based upon assessment of the patient and/or caregivers ability to self-administer the medication ordered   Complete by: As directed    No wound care   Complete by: As directed    Outpatient Parenteral Antibiotic Therapy Information Antibiotic: Meropenem (Merrem) IVPB; Indications for use: intra-abdominal abscess; End Date: 09/14/2021   Complete by: As directed    Antibiotic: Meropenem (Merrem) IVPB   Indications for use: intra-abdominal abscess   End Date: 09/14/2021      Allergies as of 09/08/2021       Reactions   Ciprofloxacin Rash        Medication List     STOP taking these medications    amoxicillin-clavulanate 875-125 MG tablet Commonly known as: AUGMENTIN   aspirin 81 MG EC tablet   chlorpheniramine-HYDROcodone 10-8 MG/5ML Suer Commonly known as: TUSSIONEX   metroNIDAZOLE 500 MG tablet Commonly known as: FLAGYL       TAKE these medications    amLODipine 5 MG tablet Commonly known as: NORVASC Take 5 mg by mouth 2 (two) times daily.   apixaban 5 MG Tabs tablet Commonly known as: ELIQUIS Take 1 tablet (5 mg total) by mouth 2 (two) times daily.   atorvastatin 80 MG tablet Commonly known as: LIPITOR Take 80 mg by mouth daily.   cyanocobalamin 1000 MCG tablet Take 1,000 mcg by mouth daily.   HYDROcodone bit-homatropine 5-1.5 MG/5ML syrup Commonly known as: HYCODAN Take 5 mLs by mouth every 6 (six) hours as needed for cough.   isosorbide mononitrate 60 MG 24 hr tablet Commonly known as: IMDUR Take 60 mg by mouth daily.   meropenem  IVPB Commonly known as: MERREM Inject 1 g into the vein every 8 (eight) hours for 6 days. Indication:  intra-abdominal abscess First Dose: No Last Day  of Therapy:  09/14/21 Labs - Once weekly:  CBC/D and BMP, Labs - Every other week:  ESR and CRP Method of administration: Mini-Bag Plus / Gravity Method of administration may be changed at the discretion of home infusion pharmacist based upon assessment of the patient and/or caregiver's ability to self-administer the medication ordered.   metFORMIN 500 MG tablet Commonly known as: GLUCOPHAGE Take 500 mg by mouth daily.   metoprolol succinate 25 MG 24 hr tablet Commonly known as: TOPROL-XL Take 12.5 mg by mouth daily.   propylthiouracil 50 MG tablet Commonly known as: PTU Take  25 mg by mouth 2 (two) times daily.   telmisartan-hydrochlorothiazide 80-12.5 MG tablet Commonly known as: MICARDIS HCT Take 1 tablet by mouth daily.   Vitamin D (Ergocalciferol) 1.25 MG (50000 UNIT) Caps capsule Commonly known as: DRISDOL Take 50,000 Units by mouth once a week.               Discharge Care Instructions  (From admission, onward)           Start     Ordered   09/08/21 0000  Change dressing on IV access line weekly and PRN  (Home infusion instructions - [facility] )        09/08/21 1158            Follow-up Information     Ronny Bacon, MD. Schedule an appointment as soon as possible for a visit on 09/19/2021.   Specialty: General Surgery Why: Hospital follow up; diverticulitis; has drains x2 Contact information: [address] [place] [address] [phone]                   If you experience worsening of your admission symptoms, develop shortness of breath, life threatening emergency, suicidal or homicidal thoughts you must seek medical attention immediately by calling 911 or calling your MD immediately  if symptoms less severe.   You must read complete instructions/literature along with all the possible adverse reactions/side effects for all the medicines you take and that have been prescribed to you. Take any new medicines  after you have completely understood and accept all the possible adverse reactions/side effects.    Please note   You were cared for by a hospitalist during your hospital stay. If you have any questions about your discharge medications or the care you received while you were in the hospital after you are discharged, you can call the unit and asked to speak with the hospitalist on call if the hospitalist that took care of you is not available. Once you are discharged, your primary care physician will handle any further medical issues. Please note that NO REFILLS for any discharge medications will be authorized once you are discharged, as it is imperative that you return to your primary care physician (or establish a relationship with a primary care physician if you do not have one) for your aftercare needs so that they can reassess your need for medications and monitor your lab values.       Time coordinating discharge: 36 minutes  Signed:  Gloriann Riede  [facility] 09/08/2021, 11:58 AM   Pager on [email or web address]. If 7PM-7AM, please contact night-coverage at [email or web address]

## 2021-09-08 NOTE — Progress Notes (Signed)
PHARMACY CONSULT NOTE FOR:  meropenem  OUTPATIENT  PARENTERAL ANTIBIOTIC THERAPY (OPAT)  Indication: intra-abdominal abscess Regimen: meropenem 1g IV q8h End date: 09/14/21  IV antibiotic discharge orders are pended. To discharging provider:  please sign these orders via discharge navigator,  Select New Orders & click on the button choice - Manage This Unsigned Work.     Thank you for allowing pharmacy to be a part of this patient's care.  Mickeal Skinner 09/08/2021, 11:48 AM

## 2021-09-12 NOTE — Progress Notes (Signed)
Daughter stated she will be here 09/13/21 for home teaching on insulin pen administration.

## 2021-09-15 ENCOUNTER — Telehealth: Payer: Self-pay

## 2021-09-15 NOTE — Telephone Encounter (Signed)
Spoke with patients daughter and CT scheduled 09/18/2021 @ 11:30 [facility]. Patient to pick up prep kit and instructions. Nothing to eat/drink 4 hours prior.  Keep scheduled appointment with Dr.Rodenberg. Patient is aware as well as daughter.

## 2021-09-18 ENCOUNTER — Other Ambulatory Visit: Payer: Self-pay

## 2021-09-18 ENCOUNTER — Ambulatory Visit
Admission: RE | Admit: 2021-09-18 | Discharge: 2021-09-18 | Disposition: A | Discharge status: Home / Self Care | Payer: Medicare Other | Source: Ambulatory Visit | Attending: Physician Assistant | Admitting: Physician Assistant

## 2021-09-18 DIAGNOSIS — K5720 Diverticulitis of large intestine with perforation and abscess without bleeding: Secondary | ICD-10-CM | POA: Diagnosis present

## 2021-09-18 MED ORDER — IOHEXOL 300 MG/ML  SOLN
100.0000 mL | Freq: Once | INTRAMUSCULAR | Status: AC | PRN
  Administered 2021-09-18: 100 mL via INTRAVENOUS

## 2021-09-19 ENCOUNTER — Other Ambulatory Visit: Payer: Self-pay

## 2021-09-19 ENCOUNTER — Ambulatory Visit: Payer: Medicare Other | Admitting: Surgery

## 2021-09-19 ENCOUNTER — Encounter: Payer: Self-pay | Admitting: Surgery

## 2021-09-19 VITALS — BP 139/81 | HR 85 | Temp 98.3°F | Ht 67.0 in | Wt 168.4 lb

## 2021-09-19 DIAGNOSIS — Z461 Encounter for fitting and adjustment of hearing aid: Secondary | ICD-10-CM | POA: Insufficient documentation

## 2021-09-19 DIAGNOSIS — K5720 Diverticulitis of large intestine with perforation and abscess without bleeding: Secondary | ICD-10-CM | POA: Diagnosis not present

## 2021-09-19 NOTE — Progress Notes (Signed)
Patient ID: Todd Acres Sr., male   DOB: May 07, 1946, 76 y.o.   MRN: [id number]  Chief Complaint: First posthospitalization visit following percutaneous drainage of diverticular abscesses.  History of Present Illness Todd E Diles Sr. is a 76 y.o. male with 2 drains present following the above, completed his IV antibiotic course of meropenem on the fifth.  Has had no further drainage despite continued irrigation of the drains.  Denies fevers, chills.  Reports his bowel movements coming more regular.  Denies abdominal pain.  Reports a good appetite.  Had CT scan completed yesterday confirming resolution of the above abscesses.  Past Medical History Past Medical History:  Diagnosis Date   Diverticulitis    Enlarged prostate    Gallstones    Hypertension    Hyperthyroidism       Past Surgical History:  Procedure Laterality Date   LEFT HEART CATH AND CORONARY ANGIOGRAPHY Left 08/03/2019   Procedure: LEFT HEART CATH AND CORONARY ANGIOGRAPHY;  Surgeon: Yolonda Kida, MD;  Location: [facility];  Service: Cardiovascular;  Laterality: Left;   MEDIAL PARTIAL KNEE REPLACEMENT Left    2018   SHOULDER SURGERY Right 2016    Allergies  Allergen Reactions   Ciprofloxacin Rash    Current Outpatient Medications  Medication Sig Dispense Refill   amLODipine (NORVASC) 5 MG tablet Take 5 mg by mouth 2 (two) times daily.     apixaban (ELIQUIS) 5 MG TABS tablet Take 1 tablet (5 mg total) by mouth 2 (two) times daily. 60 tablet 0   atorvastatin (LIPITOR) 80 MG tablet Take 80 mg by mouth daily.     cyanocobalamin 1000 MCG tablet Take 1,000 mcg by mouth daily.     HYDROcodone bit-homatropine (HYCODAN) 5-1.5 MG/5ML syrup Take 5 mLs by mouth every 6 (six) hours as needed for cough. 120 mL 0   isosorbide mononitrate (IMDUR) 60 MG 24 hr tablet Take 60 mg by mouth daily.     metFORMIN (GLUCOPHAGE) 500 MG tablet Take 500 mg by mouth daily.     metoprolol succinate (TOPROL-XL) 25 MG 24 hr  tablet Take 12.5 mg by mouth daily.     propylthiouracil (PTU) 50 MG tablet Take 25 mg by mouth 2 (two) times daily.  0   telmisartan-hydrochlorothiazide (MICARDIS HCT) 80-12.5 MG tablet Take 1 tablet by mouth daily.     Vitamin D, Ergocalciferol, (DRISDOL) 1.25 MG (50000 UNIT) CAPS capsule Take 50,000 Units by mouth once a week.     No current facility-administered medications for this visit.    Family History Family History  Problem Relation Age of Onset   Heart disease Mother    Heart disease Father       Social History Social History   Tobacco Use   Smoking status: Former    Types: Cigarettes    Quit date: 1970    Years since quitting: 53.0   Smokeless tobacco: Never  Substance Use Topics   Alcohol use: Yes    Comment: Social   Drug use: Never        Review of Systems  All other systems reviewed and are negative.    Physical Exam Blood pressure 139/81, pulse 85, temperature 98.3 F (36.8 C), temperature source Oral, height 5\' 7"  (1.702 m), weight 168 lb 6.4 oz (76.4 kg), SpO2 95 %. Last Weight  Most recent update: 09/19/2021  1:24 PM    Weight  76.4 kg (168 lb 6.4 oz)  CONSTITUTIONAL: Well developed, and nourished, appropriately responsive and aware without distress.   EYES: Sclera non-icteric.   EARS, NOSE, MOUTH AND THROAT: Mask worn.    Hearing is intact to voice.  NECK: Trachea is midline, and there is no jugular venous distension.  LYMPH NODES:  Lymph nodes in the neck are not enlarged. RESPIRATORY:  Lungs are clear, and breath sounds are equal bilaterally. Normal respiratory effort without pathologic use of accessory muscles. CARDIOVASCULAR: Heart is regular in rate and rhythm. GI: The abdomen is notable for or 2 percutaneous drains well secured in the left lower quadrant, otherwise soft, nontender, and nondistended. There were no palpable masses. I did not appreciate hepatosplenomegaly. There were normal bowel sounds.  The drains are  stained with the yellow color from prior drainage. Both drains removed without incident.  There is a small amount of purulent drainage followed by bloody drainage one of the sites.  This appeared to complete to adequacy. MUSCULOSKELETAL:  Symmetrical muscle tone appreciated in all four extremities.    SKIN: Skin turgor is normal. No pathologic skin lesions appreciated.  NEUROLOGIC:  Motor and sensation appear grossly normal.  Cranial nerves are grossly without defect. PSYCH:  Alert and oriented to person, place and time. Affect is appropriate for situation.  Data Reviewed I have personally reviewed what is currently available of the patient's imaging, recent labs and medical records.   Labs:  CBC Latest Ref Rng & Units 09/08/2021 09/07/2021 09/06/2021  WBC 4.0 - 10.5 K/uL 15.2(H) 16.5(H) 13.6(H)  Hemoglobin 13.0 - 17.0 g/dL 10.7(L) 11.5(L) 12.1(L)  Hematocrit 39.0 - 52.0 % 32.4(L) 35.6(L) 37.2(L)  Platelets 150 - 400 K/uL 395 388 384   CMP Latest Ref Rng & Units 09/06/2021 09/05/2021 09/04/2021  Glucose 70 - 99 mg/dL 123(H) 124(H) 113(H)  BUN 8 - 23 mg/dL 20 11 10   Creatinine 0.61 - 1.24 mg/dL 1.02 1.21 1.23  Sodium 135 - 145 mmol/L 137 136 130(L)  Potassium 3.5 - 5.1 mmol/L 4.2 3.5 3.6  Chloride 98 - 111 mmol/L 104 102 98  CO2 22 - 32 mmol/L 27 24 26   Calcium 8.9 - 10.3 mg/dL 8.9 8.6(L) 8.8(L)  Total Protein 6.5 - 8.1 g/dL - - -  Total Bilirubin 0.3 - 1.2 mg/dL - - -  Alkaline Phos 38 - 126 U/L - - -  AST 15 - 41 U/L - - -  ALT 0 - 44 U/L - - -      Imaging:  Within last 24 hrs: No results found.  Assessment    Diverticular abscess x2, successful percutaneous drainage with imaging resolution.  Completion of course of meropenem.  Clinically doing excellent. Patient Active Problem List   Diagnosis Date Noted   Encounter for fitting and adjustment of hearing aid 09/19/2021   Diverticulitis of large intestine with abscess without bleeding    CAD (coronary artery disease)  08/31/2021   Type 2 diabetes mellitus ([place]) 08/31/2021   Abdominal pain 08/31/2021   Abdominopelvic abscess ([place]) 08/31/2021   Pulmonary embolism ([place]) 08/31/2021   Low serum vitamin D 01/03/2021   Dupuytren's contracture 02/16/2019   Presence of left artificial knee joint 08/29/2017   Primary osteoarthritis of right knee 01/11/2017   TIA (transient ischemic attack) 12/22/2015   Benign essential hypertension 09/16/2015   Graves' disease 10/06/2014    Plan    Drains removed, caution regarding potential for recurring infection.  While on the watch for fever/chills, increasing pain, nausea vomiting etc.  I believe the patient the  family are well aware that there may be a recurrence of the infection, thus require further antibiotics or intervention.  At this point in time were optimistic. He may shower, and replace the dressing over the drain sites as needed to protect from drainage. Will follow up in 6 weeks or as needed.  Face-to-face time spent with the patient and accompanying care providers(if present) was 45 minutes, with more than 50% of the time spent counseling, educating, and coordinating care of the patient.    These notes generated with voice recognition software. I apologize for typographical errors.  Ronny Bacon M.D., FACS 09/19/2021, 2:05 PM

## 2021-09-19 NOTE — Progress Notes (Deleted)
Patient ID: Todd Acres Sr., male   DOB: 30-Dec-1945, 76 y.o.   MRN: [id number]  Chief Complaint:  ***  History of Present Illness Todd E 21 Sr. is a 76 y.o. male with ***.  IV meropenem through 09/14/2021   Past Medical History Past Medical History:  Diagnosis Date   Diverticulitis    Enlarged prostate    Gallstones    Hypertension    Hyperthyroidism       Past Surgical History:  Procedure Laterality Date   LEFT HEART CATH AND CORONARY ANGIOGRAPHY Left 08/03/2019   Procedure: LEFT HEART CATH AND CORONARY ANGIOGRAPHY;  Surgeon: Yolonda Kida, MD;  Location: [facility];  Service: Cardiovascular;  Laterality: Left;   MEDIAL PARTIAL KNEE REPLACEMENT Left    2018   SHOULDER SURGERY Right 2016    Allergies  Allergen Reactions   Ciprofloxacin Rash    Current Outpatient Medications  Medication Sig Dispense Refill   amLODipine (NORVASC) 5 MG tablet Take 5 mg by mouth 2 (two) times daily.     apixaban (ELIQUIS) 5 MG TABS tablet Take 1 tablet (5 mg total) by mouth 2 (two) times daily. 60 tablet 0   atorvastatin (LIPITOR) 80 MG tablet Take 80 mg by mouth daily.     cyanocobalamin 1000 MCG tablet Take 1,000 mcg by mouth daily.     HYDROcodone bit-homatropine (HYCODAN) 5-1.5 MG/5ML syrup Take 5 mLs by mouth every 6 (six) hours as needed for cough. 120 mL 0   isosorbide mononitrate (IMDUR) 60 MG 24 hr tablet Take 60 mg by mouth daily.     metFORMIN (GLUCOPHAGE) 500 MG tablet Take 500 mg by mouth daily.     metoprolol succinate (TOPROL-XL) 25 MG 24 hr tablet Take 12.5 mg by mouth daily.     propylthiouracil (PTU) 50 MG tablet Take 25 mg by mouth 2 (two) times daily.  0   telmisartan-hydrochlorothiazide (MICARDIS HCT) 80-12.5 MG tablet Take 1 tablet by mouth daily.     Vitamin D, Ergocalciferol, (DRISDOL) 1.25 MG (50000 UNIT) CAPS capsule Take 50,000 Units by mouth once a week.     No current facility-administered medications for this visit.     Family History Family History  Problem Relation Age of Onset   Heart disease Mother    Heart disease Father       Social History Social History   Tobacco Use   Smoking status: Former    Types: Cigarettes    Quit date: 1970    Years since quitting: 53.0   Smokeless tobacco: Never  Substance Use Topics   Alcohol use: Yes    Comment: Social   Drug use: Never        ROS ***   Physical Exam Blood pressure 139/81, pulse 85, temperature 98.3 F (36.8 C), temperature source Oral, height 5\' 7"  (1.702 m), weight 168 lb 6.4 oz (76.4 kg), SpO2 95 %. Last Weight  Most recent update: 09/19/2021  1:24 PM    Weight  76.4 kg (168 lb 6.4 oz)             CONSTITUTIONAL: Well developed, and nourished, appropriately responsive and aware without distress. ***  EYES: Sclera non-icteric.   EARS, NOSE, MOUTH AND THROAT: Mask worn.  *** The oropharynx is clear. Oral mucosa is pink and moist.  Dentition: ***   Hearing is intact to voice.  NECK: Trachea is midline, and there is no jugular venous distension.  LYMPH NODES:  Lymph nodes in the  neck are not enlarged. RESPIRATORY:  Lungs are clear, and breath sounds are equal bilaterally. Normal respiratory effort without pathologic use of accessory muscles. CARDIOVASCULAR: Heart is regular in rate and rhythm. GI: The abdomen is *** soft, nontender, and nondistended. There were no palpable masses. I did not appreciate hepatosplenomegaly. There were normal bowel sounds. GU: *** MUSCULOSKELETAL:  Symmetrical muscle tone appreciated in all four extremities.    SKIN: Skin turgor is normal. No pathologic skin lesions appreciated.  NEUROLOGIC:  Motor and sensation appear grossly normal.  Cranial nerves are grossly without defect. PSYCH:  Alert and oriented to person, place and time. Affect is appropriate for situation.  Data Reviewed I have personally reviewed what is currently available of the patient's imaging, recent labs and medical  records.   Labs:  CBC Latest Ref Rng & Units 09/08/2021 09/07/2021 09/06/2021  WBC 4.0 - 10.5 K/uL 15.2(H) 16.5(H) 13.6(H)  Hemoglobin 13.0 - 17.0 g/dL 10.7(L) 11.5(L) 12.1(L)  Hematocrit 39.0 - 52.0 % 32.4(L) 35.6(L) 37.2(L)  Platelets 150 - 400 K/uL 395 388 384   CMP Latest Ref Rng & Units 09/06/2021 09/05/2021 09/04/2021  Glucose 70 - 99 mg/dL 123(H) 124(H) 113(H)  BUN 8 - 23 mg/dL 20 11 10   Creatinine 0.61 - 1.24 mg/dL 1.02 1.21 1.23  Sodium 135 - 145 mmol/L 137 136 130(L)  Potassium 3.5 - 5.1 mmol/L 4.2 3.5 3.6  Chloride 98 - 111 mmol/L 104 102 98  CO2 22 - 32 mmol/L 27 24 26   Calcium 8.9 - 10.3 mg/dL 8.9 8.6(L) 8.8(L)  Total Protein 6.5 - 8.1 g/dL - - -  Total Bilirubin 0.3 - 1.2 mg/dL - - -  Alkaline Phos 38 - 126 U/L - - -  AST 15 - 41 U/L - - -  ALT 0 - 44 U/L - - -   *** Specimen Description WOUND  Performed at [facility], [address], [place], [place] [address]   Special Requests [place]  Performed at [facility], [address] [place], [place] [address]   Gram Stain ABUNDANT WBC PRESENT, PREDOMINANTLY MONONUCLEAR  FEW GRAM NEGATIVE RODS   Culture MODERATE ENTEROBACTER AEROGENES  RARE ESCHERICHIA COLI  RARE PSEUDOMONAS AERUGINOSA  RARE BACTEROIDES DISTASONIS  BETA LACTAMASE POSITIVE  Performed at [facility], [place] [address], [place], [place] [address]   Report Status 09/05/2021 FINAL   Organism ID, Bacteria ENTEROBACTER AEROGENES   Organism ID, Bacteria ESCHERICHIA COLI   Organism ID, Bacteria PSEUDOMONAS AERUGINOSA   Resulting Agency [facility]     Susceptibility   Enterobacter aerogenes Escherichia coli Pseudomonas aeruginosa    MIC MIC MIC    AMPICILLIN   4 SENSITIVE  Sensitive      AMPICILLIN/SULBACTAM   <=2 SENSITIVE  Sensitive      CEFAZOLIN >=64 RESIST... Resistant <=4 SENSITIVE  Sensitive      CEFEPIME 0.5 SENSITIVE  Sensitive <=0.12 SENS... Sensitive 1 SENSITIVE  Sensitive    CEFTAZIDIME >=64  RESIST... Resistant <=1 SENSITIVE  Sensitive <=1 SENSITIVE  Sensitive    CEFTRIAXONE >=64 RESIST... Resistant <=0.25 SENS... Sensitive      CIPROFLOXACIN <=0.25 SENS... Sensitive <=0.25 SENS... Sensitive <=0.25 SENS... Sensitive    GENTAMICIN <=1 SENSITIVE  Sensitive <=1 SENSITIVE  Sensitive <=1 SENSITIVE  Sensitive    IMIPENEM <=0.25 SENS... Sensitive <=0.25 SENS... Sensitive 2 SENSITIVE  Sensitive    PIP/TAZO >=128 RESIS... Resistant <=4 SENSITIVE  Sensitive <=4 SENSITIVE  Sensitive    TRIMETH/SULFA <=20 SENSIT... Sensitive <=20  SENSIT... Sensitive                        Imaging: {Labs :18171} Within last 24 hrs: No results found.  Assessment    *** Patient Active Problem List   Diagnosis Date Noted   Encounter for fitting and adjustment of hearing aid 09/19/2021   Diverticulitis of large intestine with abscess without bleeding    CAD (coronary artery disease) 08/31/2021   Type 2 diabetes mellitus ([place]) 08/31/2021   Abdominal pain 08/31/2021   Abdominopelvic abscess ([place]) 08/31/2021   Pulmonary embolism ([place]) 08/31/2021   Low serum vitamin D 01/03/2021   Dupuytren's contracture 02/16/2019   Presence of left artificial knee joint 08/29/2017   Primary osteoarthritis of right knee 01/11/2017   TIA (transient ischemic attack) 12/22/2015   Benign essential hypertension 09/16/2015   Graves' disease 10/06/2014    Plan    ***  Face-to-face time spent with the patient and accompanying care providers(if present) was *** minutes, with more than 50% of the time spent counseling, educating, and coordinating care of the patient.    These notes generated with voice recognition software. I apologize for typographical errors.  Ronny Bacon M.D., FACS 09/19/2021, 1:25 PM

## 2021-09-19 NOTE — Patient Instructions (Addendum)
We have removed your drains today. These will continue to drain for a day or two. Change the dressing 1 time a day until it fully closes.  Call us if you start to have any abdominal pain, nausea with vomiting, chills, or a fever over 100.  Activity as tolerated.  You may shower tomorrow-remove your dressing first and let the warm soapy water run over the area, rinse well, and pat dry and redress.  You may eat whatever you like, you have no dietary restrictions. Just listen to your body. If something bothers you avoid eating it.   Follow up here in 6 weeks.    Diverticulitis Diverticulitis is when small pouches in your colon (large intestine) get infected or swollen. This causes pain in the belly (abdomen) and watery poop (diarrhea). These pouches are called diverticula. The pouches form in people who have a condition called diverticulosis. What are the causes? This condition may be caused by poop (stool) that gets trapped in the pouches in your colon. The poop lets germs (bacteria) grow in the pouches. This causes the infection. What increases the risk? You are more likely to get this condition if you have small pouches in your colon. The risk is higher if: You are overweight or very overweight (obese). You do not exercise enough. You drink alcohol. You smoke or use products with tobacco in them. You eat a diet that has a lot of red meat such as beef, pork, or lamb. You eat a diet that does not have enough fiber in it. You are older than 78 years of age. What are the signs or symptoms? Pain in the belly. Pain is often on the left side, but it may be in other areas. Fever and feeling cold. Feeling like you may vomit. Vomiting. Having cramps. Feeling full. Changes to how often you poop. Blood in your poop. How is this treated? Most cases are treated at home by: Taking over-the-counter pain medicines. Following a clear liquid diet. Taking antibiotic medicines. Resting. Very bad  cases may need to be treated at a hospital. This may include: Not eating or drinking. Taking prescription pain medicine. Getting antibiotic medicines through an IV tube. Getting fluid and food through an IV tube. Having surgery. When you are feeling better, your doctor may tell you to have a test to check your colon (colonoscopy). Follow these instructions at home: Medicines Take over-the-counter and prescription medicines only as told by your doctor. These include: Antibiotics. Pain medicines. Fiber pills. Probiotics. Stool softeners. If you were prescribed an antibiotic medicine, take it as told by your doctor. Do not stop taking the antibiotic even if you start to feel better. Ask your doctor if the medicine prescribed to you requires you to avoid driving or using machinery. Eating and drinking  Follow a diet as told by your doctor. When you feel better, your doctor may tell you to change your diet. You may need to eat a lot of fiber. Fiber makes it easier to poop (have a bowel movement). Foods with fiber include: Berries. Beans. Lentils. Green vegetables. General instructions Do not use any products that contain nicotine or tobacco, such as cigarettes, e-cigarettes, and chewing tobacco. If you need help quitting, ask your doctor. Exercise 3 or more times a week. Try to get 30 minutes each time. Exercise enough to sweat and make your heart beat faster. Keep all follow-up visits as told by your doctor. This is important. Contact a doctor if: Your pain does not get  better. You are not pooping like normal. Get help right away if: Your pain gets worse. Your symptoms do not get better. Your symptoms get worse very fast. You have a fever. You vomit more than one time. You have poop that is: Bloody. Black. Tarry. Summary This condition happens when small pouches in your colon get infected or swollen. Take medicines only as told by your doctor. Follow a diet as told by your  doctor. Keep all follow-up visits as told by your doctor. This is important. This information is not intended to replace advice given to you by your health care provider. Make sure you discuss any questions you have with your health care provider. Document Revised: 06/08/2019 Document Reviewed: 06/08/2019 Elsevier Patient Education  2022 [employer].

## 2021-10-03 ENCOUNTER — Telehealth: Payer: Self-pay | Admitting: Surgery

## 2021-10-03 NOTE — Telephone Encounter (Signed)
Patients daughter called and is asking for a refill on his Eliquis, but is asking if there is a generic brand that is cheaper than eliquis patient is using  [facility] [place]. Patient daughter asked if we could call this number Number before 3 [phone], if after 3 call [phone]. Spoke with Caryl-Lyn the nurse in regards to the patient, patient needs to start with PCP first and if they do fill the rx to call us back

## 2021-10-10 ENCOUNTER — Other Ambulatory Visit: Payer: Self-pay

## 2021-10-10 ENCOUNTER — Telehealth: Payer: Self-pay

## 2021-10-10 MED ORDER — APIXABAN 5 MG PO TABS: 5.0000 mg | ORAL_TABLET | Freq: Two times a day (BID) | ORAL | 0 refills | Status: DC

## 2021-10-10 NOTE — Telephone Encounter (Signed)
Patient's daughter called and says that they have tried calling his primary care doctor about refilling his Eliquis and they have not gotten back to them. He is down to one pill and needs this refilled. I spoke with Dr Christian Mate and we will refill his medication for one month. Patient notified that he will need to have his primary care provider manage this medication in the future.

## 2021-10-31 ENCOUNTER — Other Ambulatory Visit: Payer: Self-pay

## 2021-10-31 ENCOUNTER — Ambulatory Visit (INDEPENDENT_AMBULATORY_CARE_PROVIDER_SITE_OTHER): Payer: Medicare Other | Admitting: Surgery

## 2021-10-31 ENCOUNTER — Encounter: Payer: Self-pay | Admitting: Surgery

## 2021-10-31 VITALS — BP 142/84 | HR 73 | Temp 98.4°F | Ht 67.0 in | Wt 173.0 lb

## 2021-10-31 DIAGNOSIS — K5720 Diverticulitis of large intestine with perforation and abscess without bleeding: Secondary | ICD-10-CM

## 2021-10-31 NOTE — Patient Instructions (Addendum)
We will have you follow up here in about 3 months. You may want to take a fiber supplement daily to keep yourself regular.   Call and speak with your primary care provider about managing your Plavix.    Please call and ask to speak with a nurse if you develop questions or concerns.

## 2021-10-31 NOTE — Progress Notes (Signed)
Patient ID: Todd Sickle Sr., male   DOB: January 08, 1946, 76 y.o.   MRN: [id number]  Chief Complaint: Second posthospitalization visit following percutaneous drainage of diverticular abscesses.  History of Present Illness Todd E Hao Sr. is a 76 y.o. male reports he is feeling great, has not felt this well in some time.  He denies any abdominal pain, denies any diarrhea or constipation.  Denies fevers, & chills.  Reports his bowel movements have become regular.   Reports a good appetite.  He is now 2 months out from his hospitalization for diverticular abscess.  He feels so well he has no interest at all in elective surgery at this time.  He is looking forward to getting back to playing golf.  Past Medical History Past Medical History:  Diagnosis Date   Diverticulitis    Enlarged prostate    Gallstones    Hypertension    Hyperthyroidism       Past Surgical History:  Procedure Laterality Date   LEFT HEART CATH AND CORONARY ANGIOGRAPHY Left 08/03/2019   Procedure: LEFT HEART CATH AND CORONARY ANGIOGRAPHY;  Surgeon: Alwyn Pea, MD;  Location: [facility] INVASIVE CV LAB;  Service: Cardiovascular;  Laterality: Left;   MEDIAL PARTIAL KNEE REPLACEMENT Left    2018   SHOULDER SURGERY Right 2016    Allergies  Allergen Reactions   Ciprofloxacin Rash    Current Outpatient Medications  Medication Sig Dispense Refill   amLODipine (NORVASC) 5 MG tablet Take 5 mg by mouth 2 (two) times daily.     apixaban (ELIQUIS) 5 MG TABS tablet Take 1 tablet (5 mg total) by mouth 2 (two) times daily. 60 tablet 0   atorvastatin (LIPITOR) 80 MG tablet Take 80 mg by mouth daily.     cyanocobalamin 1000 MCG tablet Take 1,000 mcg by mouth daily.     HYDROcodone bit-homatropine (HYCODAN) 5-1.5 MG/5ML syrup Take 5 mLs by mouth every 6 (six) hours as needed for cough. 120 mL 0   isosorbide mononitrate (IMDUR) 60 MG 24 hr tablet Take 60 mg by mouth daily.     metFORMIN (GLUCOPHAGE) 500 MG tablet Take 500 mg  by mouth daily.     metoprolol succinate (TOPROL-XL) 25 MG 24 hr tablet Take 12.5 mg by mouth daily.     propylthiouracil (PTU) 50 MG tablet Take 25 mg by mouth 2 (two) times daily.  0   telmisartan-hydrochlorothiazide (MICARDIS HCT) 80-12.5 MG tablet Take 1 tablet by mouth daily.     Vitamin D, Ergocalciferol, (DRISDOL) 1.25 MG (50000 UNIT) CAPS capsule Take 50,000 Units by mouth once a week.     No current facility-administered medications for this visit.    Family History Family History  Problem Relation Age of Onset   Heart disease Mother    Heart disease Father       Social History Social History   Tobacco Use   Smoking status: Former    Types: Cigarettes    Quit date: 1970    Years since quitting: 53.1    Passive exposure: Never   Smokeless tobacco: Never  Substance Use Topics   Alcohol use: Yes    Comment: Social   Drug use: Never        Review of Systems  All other systems reviewed and are negative.    Physical Exam Blood pressure (!) 142/84, pulse 73, temperature 98.4 F (36.9 C), height 5\' 7"  (1.702 m), weight 173 lb (78.5 kg), SpO2 95 %. Last Weight  Most recent  update: 10/31/2021  8:39 AM    Weight  78.5 kg (173 lb)             CONSTITUTIONAL: Well developed, and nourished, appropriately responsive and aware without distress.   EYES: Sclera non-icteric.   EARS, NOSE, MOUTH AND THROAT: Mask worn.    Hearing is intact to voice.  NECK: Trachea is midline, and there is no jugular venous distension.  LYMPH NODES:  Lymph nodes in the neck are not enlarged. RESPIRATORY:  Lungs are clear, and breath sounds are equal bilaterally. Normal respiratory effort without pathologic use of accessory muscles. CARDIOVASCULAR: Heart is regular in rate and rhythm. GI: The abdomen is soft, nontender, and nondistended. There were no palpable masses. I did not appreciate hepatosplenomegaly.  MUSCULOSKELETAL:  Symmetrical muscle tone appreciated in all four extremities.     SKIN: Skin turgor is normal. No pathologic skin lesions appreciated.  NEUROLOGIC:  Motor and sensation appear grossly normal.  Cranial nerves are grossly without defect. PSYCH:  Alert and oriented to person, place and time. Affect is appropriate for situation.  Data Reviewed I have personally reviewed what is currently available of the patient's imaging, recent labs and medical records.   Labs:  CBC Latest Ref Rng & Units 09/08/2021 09/07/2021 09/06/2021  WBC 4.0 - 10.5 K/uL 15.2(H) 16.5(H) 13.6(H)  Hemoglobin 13.0 - 17.0 g/dL 10.7(L) 11.5(L) 12.1(L)  Hematocrit 39.0 - 52.0 % 32.4(L) 35.6(L) 37.2(L)  Platelets 150 - 400 K/uL 395 388 384   CMP Latest Ref Rng & Units 09/06/2021 09/05/2021 09/04/2021  Glucose 70 - 99 mg/dL 123(H) 124(H) 113(H)  BUN 8 - 23 mg/dL 20 11 10   Creatinine 0.61 - 1.24 mg/dL 1.02 1.21 1.23  Sodium 135 - 145 mmol/L 137 136 130(L)  Potassium 3.5 - 5.1 mmol/L 4.2 3.5 3.6  Chloride 98 - 111 mmol/L 104 102 98  CO2 22 - 32 mmol/L 27 24 26   Calcium 8.9 - 10.3 mg/dL 8.9 8.6(L) 8.8(L)  Total Protein 6.5 - 8.1 g/dL - - -  Total Bilirubin 0.3 - 1.2 mg/dL - - -  Alkaline Phos 38 - 126 U/L - - -  AST 15 - 41 U/L - - -  ALT 0 - 44 U/L - - -      Imaging: CLINICAL DATA:  Diverticulitis and abscess.   EXAM: CT ABDOMEN AND PELVIS WITH CONTRAST   TECHNIQUE: Multidetector CT imaging of the abdomen and pelvis was performed using the standard protocol following bolus administration of intravenous contrast.   CONTRAST:  121mL OMNIPAQUE IOHEXOL 300 MG/ML  SOLN   COMPARISON:  September 04, 2021.   FINDINGS: Lower chest: No acute abnormality.   Hepatobiliary: No focal liver abnormality is seen. No gallstones, gallbladder wall thickening, or biliary dilatation.   Pancreas: Unremarkable. No pancreatic ductal dilatation or surrounding inflammatory changes.   Spleen: Normal in size without focal abnormality.   Adrenals/Urinary Tract: Adrenal glands appear normal.  Left renal cyst is noted. Nonobstructive right renal calculus is noted. No hydronephrosis or renal obstruction is noted. Urinary bladder is unremarkable.   Stomach/Bowel: Stomach and appendix are unremarkable. There is no evidence of bowel obstruction. Diverticulosis is noted throughout the colon. There is significantly diverticulitis of sigmoid colon with decreased inflammation involving adjacent small bowel loop. Continued presence of 2 percutaneous drainage catheters between these bowel loops with no significant fluid collection remaining.   Vascular/Lymphatic: Aortic atherosclerosis. No enlarged abdominal or pelvic lymph nodes.   Reproductive: Prostate is unremarkable.   Other: No abdominal  wall hernia or abnormality. No abdominopelvic ascites.   Musculoskeletal: No acute or significant osseous findings.   IMPRESSION: Continued presence of 2 percutaneous drainage catheters in the left side of the abdomen, with no significant residual fluid seen in the para diverticular abscesses. There is also noted significantly decreased inflammation involving the diverticulitis as well as the inflammation involving the adjacent small bowel loop.   Nonobstructive right renal calculus.   Aortic Atherosclerosis (ICD10-I70.0).     Electronically Signed   By: Marijo Conception M.D.   On: 09/18/2021 15:28   Assessment    Diverticular abscess x2, successful percutaneous drainage with imaging and clinical resolution.  Completion of course of meropenem.  Clinically doing excellent. Patient Active Problem List   Diagnosis Date Noted   Encounter for fitting and adjustment of hearing aid 09/19/2021   Diverticulitis of large intestine with abscess without bleeding    CAD (coronary artery disease) 08/31/2021   Type 2 diabetes mellitus ([place]) 08/31/2021   Abdominal pain 08/31/2021   Abdominopelvic abscess ([place]) 08/31/2021   Pulmonary embolism ([place]) 08/31/2021   Low serum vitamin D 01/03/2021    Dupuytren's contracture 02/16/2019   Presence of left artificial knee joint 08/29/2017   Primary osteoarthritis of right knee 01/11/2017   TIA (transient ischemic attack) 12/22/2015   Benign essential hypertension 09/16/2015   Graves' disease 10/06/2014    Plan    I believe the patient the family are well aware that there may be a recurrence of the infection, thus require further antibiotics or intervention.  At this point in time we are optimistic. Will follow up in 3 months or as needed.  Advised to pursue a goal of 25 to 30 g of fiber daily.  Made aware that the majority of this may be through natural sources, but advised to be aware of actual consumption and to ensure minimal consumption by daily supplementation.  Various forms of supplements discussed.  Strongly advised to consume more fluids to ensure adequate hydration, instructed to watch color of urine to determine adequacy of hydration.  Clarity is pursued in urine output, and bowel activity that correlates to significant meal intake.  Patient is to avoid deferring having bowel movements, advised to take the time at the first sign of sensation, typically following meals and in the morning.  Subsequent utilization of MiraLAX to ensure at least daily movement, ideally twice daily bowel movements.  If multiple doses of MiraLAX are necessary utilize them.   Face-to-face time spent with the patient and accompanying care providers(if present) was 25 minutes, with more than 50% of the time spent counseling, educating, and coordinating care of the patient.    These notes generated with voice recognition software. I apologize for typographical errors.  Ronny Bacon M.D., FACS 10/31/2021, 9:32 AM

## 2022-01-09 DIAGNOSIS — K5790 Diverticulosis of intestine, part unspecified, without perforation or abscess without bleeding: Secondary | ICD-10-CM | POA: Insufficient documentation

## 2022-01-30 ENCOUNTER — Ambulatory Visit: Payer: Medicare Other | Admitting: Surgery

## 2022-02-06 ENCOUNTER — Encounter: Payer: Self-pay | Admitting: Surgery

## 2022-02-06 ENCOUNTER — Other Ambulatory Visit: Payer: Self-pay

## 2022-02-06 ENCOUNTER — Ambulatory Visit: Payer: Medicare Other | Admitting: Surgery

## 2022-02-06 VITALS — BP 126/76 | HR 59 | Temp 97.9°F | Ht 67.0 in | Wt 176.2 lb

## 2022-02-06 DIAGNOSIS — K5720 Diverticulitis of large intestine with perforation and abscess without bleeding: Secondary | ICD-10-CM

## 2022-02-06 NOTE — Progress Notes (Signed)
Patient ID: Todd Sickle Sr., male   DOB: Mar 30, 1946, 76 y.o.   MRN: [id number]  Chief Complaint: Third posthospitalization visit following percutaneous drainage of diverticular abscesses.  History of Present Illness Todd E Scheier Sr. is a 76 y.o. male reports he is feeling great, and seems to be enjoying life, playing golf frequently and bowling.  He denies any abdominal pain, denies any diarrhea or constipation.  Denies fevers, & chills.  Reports his bowel movements are consistently easy to move and twice daily.   He admits he is not taking any fiber supplementation, but is ensuring drinking a lot of water.  Reports a good appetite.  He is now 6 months out from his hospitalization for diverticular abscess.  He feels so well he has no interest at all in elective surgery at this time.  He is willing to begin supplementing fiber despite his excellent bowel activity.  Past Medical History Past Medical History:  Diagnosis Date   Diverticulitis    Diverticulitis of large intestine with abscess without bleeding    Enlarged prostate    Gallstones    Hypertension    Hyperthyroidism       Past Surgical History:  Procedure Laterality Date   LEFT HEART CATH AND CORONARY ANGIOGRAPHY Left 08/03/2019   Procedure: LEFT HEART CATH AND CORONARY ANGIOGRAPHY;  Surgeon: Alwyn Pea, MD;  Location: [facility] INVASIVE CV LAB;  Service: Cardiovascular;  Laterality: Left;   MEDIAL PARTIAL KNEE REPLACEMENT Left    2018   SHOULDER SURGERY Right 2016    Allergies  Allergen Reactions   Ciprofloxacin Rash    Current Outpatient Medications  Medication Sig Dispense Refill   amLODipine (NORVASC) 5 MG tablet Take 5 mg by mouth 2 (two) times daily.     apixaban (ELIQUIS) 5 MG TABS tablet Take 1 tablet (5 mg total) by mouth 2 (two) times daily. 60 tablet 0   atorvastatin (LIPITOR) 80 MG tablet Take 80 mg by mouth daily.     cyanocobalamin 1000 MCG tablet Take 1,000 mcg by mouth daily.     isosorbide  mononitrate (IMDUR) 60 MG 24 hr tablet Take 60 mg by mouth daily.     metFORMIN (GLUCOPHAGE) 500 MG tablet Take 500 mg by mouth daily.     metoprolol succinate (TOPROL-XL) 25 MG 24 hr tablet Take 12.5 mg by mouth daily.     propylthiouracil (PTU) 50 MG tablet Take 25 mg by mouth 2 (two) times daily.  0   telmisartan-hydrochlorothiazide (MICARDIS HCT) 80-12.5 MG tablet Take 1 tablet by mouth daily.     Vitamin D, Ergocalciferol, (DRISDOL) 1.25 MG (50000 UNIT) CAPS capsule Take 50,000 Units by mouth once a week.     No current facility-administered medications for this visit.    Family History Family History  Problem Relation Age of Onset   Heart disease Mother    Heart disease Father       Social History Social History   Tobacco Use   Smoking status: Former    Types: Cigarettes    Quit date: 1970    Years since quitting: 53.4    Passive exposure: Never   Smokeless tobacco: Never  Substance Use Topics   Alcohol use: Yes    Comment: Social   Drug use: Never        Review of Systems  All other systems reviewed and are negative.    Physical Exam Blood pressure 126/76, pulse (!) 59, temperature 97.9 F (36.6 C), temperature source Oral,  height 5\' 7"  (1.702 m), weight 176 lb 3.2 oz (79.9 kg), SpO2 96 %. Last Weight  Most recent update: 02/06/2022  9:04 AM    Weight  79.9 kg (176 lb 3.2 oz)             CONSTITUTIONAL: Well developed, and nourished, appropriately responsive and aware without distress.   EYES: Sclera non-icteric.   EARS, NOSE, MOUTH AND THROAT: Mask worn.    Hearing is intact to voice.  NECK: Trachea is midline, and there is no jugular venous distension.  LYMPH NODES:  Lymph nodes in the neck are not enlarged. RESPIRATORY:  Lungs are clear, and breath sounds are equal bilaterally. Normal respiratory effort without pathologic use of accessory muscles. CARDIOVASCULAR: Heart is regular in rate and rhythm. GI: He has a soft readily reducible umbilical  hernia.  The abdomen is soft, nontender, and nondistended. There were no palpable masses. I did not appreciate hepatosplenomegaly.  MUSCULOSKELETAL:  Symmetrical muscle tone appreciated in all four extremities.    SKIN: Skin turgor is normal. No pathologic skin lesions appreciated.  NEUROLOGIC:  Motor and sensation appear grossly normal.  Cranial nerves are grossly without defect. PSYCH:  Alert and oriented to person, place and time. Affect is appropriate for situation.  Data Reviewed I have personally reviewed what is currently available of the patient's imaging, recent labs and medical records.   Labs:     Latest Ref Rng & Units 09/08/2021    5:40 AM 09/07/2021    4:25 AM 09/06/2021    3:02 AM  CBC  WBC 4.0 - 10.5 K/uL 15.2   16.5   13.6    Hemoglobin 13.0 - 17.0 g/dL 09/08/2021   14.7   82.9    Hematocrit 39.0 - 52.0 % 32.4   35.6   37.2    Platelets 150 - 400 K/uL 395   388   384        Latest Ref Rng & Units 09/06/2021    3:02 AM 09/05/2021    6:01 AM 09/04/2021    2:43 AM  CMP  Glucose 70 - 99 mg/dL 09/06/2021   130   865    BUN 8 - 23 mg/dL 20   11   10     Creatinine 0.61 - 1.24 mg/dL 784     6.96    Sodium 135 - 145 mmol/L 137   136   130    Potassium 3.5 - 5.1 mmol/L 4.2   3.5   3.6    Chloride 98 - 111 mmol/L 104   102   98    CO2 22 - 32 mmol/L 27   24   26     Calcium 8.9 - 10.3 mg/dL 8.9   8.6   8.8      Assessment    Clinically doing excellent.  Patient Active Problem List   Diagnosis Date Noted   Diverticulosis 01/09/2022   Encounter for fitting and adjustment of hearing aid 09/19/2021   CAD (coronary artery disease) 08/31/2021   Type 2 diabetes mellitus ([facility]) 08/31/2021   Abdominal pain 08/31/2021   Pulmonary embolism ([facility]) 08/31/2021   Low serum vitamin D 01/03/2021   Dupuytren's contracture 02/16/2019   Presence of left artificial knee joint 08/29/2017   Primary osteoarthritis of right knee 01/11/2017   TIA (transient ischemic attack) 12/22/2015   Benign  essential hypertension 09/16/2015   Graves' disease 10/06/2014    Plan    I believe the patient is well  aware that there may be a recurrence of the diverticulitis.  Will follow up as needed.  Encouraged to pursue a goal of 25 to 30 g of fiber daily.  Made aware that the majority of this may be through natural sources, but advised to be aware of actual consumption and to ensure minimal consumption by daily supplementation.  Various forms of supplements discussed.  Strongly advised to consume more fluids to ensure adequate hydration, instructed to watch color of urine to determine adequacy of hydration.  Clarity is pursued in urine output, and bowel activity that correlates to significant meal intake.  Patient is to avoid deferring having bowel movements, advised to take the time at the first sign of sensation, typically following meals and in the morning.  Subsequent utilization of MiraLAX to ensure at least daily movement, ideally twice daily bowel movements.  If multiple doses of MiraLAX are necessary utilize them.  He appears to be doing very well and I am surprised he did not initiate fiber supplementation after our last visit.  However he does wish to do what he can to help reduce his risk of recurrence. Face-to-face time spent with the patient and accompanying care providers(if present) was 15 minutes, with more than 50% of the time spent counseling, educating, and coordinating care of the patient.    These notes generated with voice recognition software. I apologize for typographical errors.  Campbell Lerner M.D., FACS 02/06/2022, 9:13 AM

## 2022-02-06 NOTE — Patient Instructions (Signed)
Diverticulitis  Diverticulitis is infection or inflammation of small pouches (diverticula) in the colon that form due to a condition called diverticulosis. Diverticula can trap stool (feces) and bacteria, causing infection and inflammation. Diverticulitis may cause severe stomach pain and diarrhea. It may lead to tissue damage in the colon that causes bleeding or blockage. The diverticula may also burst (rupture) and cause infected stool to enter other areas of the abdomen. What are the causes? This condition is caused by stool becoming trapped in the diverticula, which allows bacteria to grow in the diverticula. This leads to inflammation and infection. What increases the risk? You are more likely to develop this condition if you have diverticulosis. The risk increases if you: Are overweight or obese. Do not get enough exercise. Drink alcohol. Use tobacco products. Eat a diet that has a lot of red meat such as beef, pork, or lamb. Eat a diet that does not include enough fiber. High-fiber foods include fruits, vegetables, beans, nuts, and whole grains. Are over 40 years of age. What are the signs or symptoms? Symptoms of this condition may include: Pain and tenderness in the abdomen. The pain is normally located on the left side of the abdomen, but it may occur in other areas. Fever and chills. Nausea. Vomiting. Cramping. Bloating. Changes in bowel routines. Blood in your stool. How is this diagnosed? This condition is diagnosed based on: Your medical history. A physical exam. Tests to make sure there is nothing else causing your condition. These tests may include: Blood tests. Urine tests. CT scan of the abdomen. How is this treated? Most cases of this condition are mild and can be treated at home. Treatment may include: Taking over-the-counter pain medicines. Following a clear liquid diet. Taking antibiotic medicines by mouth. Resting. More severe cases may need to be treated  at a hospital. Treatment may include: Not eating or drinking. Taking prescription pain medicine. Receiving antibiotic medicines through an IV. Receiving fluids and nutrition through an IV. Surgery. When your condition is under control, your health care provider may recommend that you have a colonoscopy. This is an exam to look at the entire large intestine. During the exam, a lubricated, bendable tube is inserted into the anus and then passed into the rectum, colon, and other parts of the large intestine. A colonoscopy can show how severe your diverticula are and whether something else may be causing your symptoms. Follow these instructions at home: Medicines Take over-the-counter and prescription medicines only as told by your health care provider. These include fiber supplements, probiotics, and stool softeners. If you were prescribed an antibiotic medicine, take it as told by your health care provider. Do not stop taking the antibiotic even if you start to feel better. Ask your health care provider if the medicine prescribed to you requires you to avoid driving or using machinery. Eating and drinking  Follow a full liquid diet or another diet as directed by your health care provider. After your symptoms improve, your health care provider may tell you to change your diet. He or she may recommend that you eat a diet that contains at least 25 grams (25 g) of fiber daily. Fiber makes it easier to pass stool. Healthy sources of fiber include: Berries. One cup contains 4-8 grams of fiber. Beans or lentils. One-half cup contains 5-8 grams of fiber. Green vegetables. One cup contains 4 grams of fiber. Avoid eating red meat. General instructions Do not use any products that contain nicotine or tobacco, such as   cigarettes, e-cigarettes, and chewing tobacco. If you need help quitting, ask your health care provider. Exercise for at least 30 minutes, 3 times each week. You should exercise hard enough to  raise your heart rate and break a sweat. Keep all follow-up visits as told by your health care provider. This is important. You may need to have a colonoscopy. Contact a health care provider if: Your pain does not improve. Your bowel movements do not return to normal. Get help right away if: Your pain gets worse. Your symptoms do not get better with treatment. Your symptoms suddenly get worse. You have a fever. You vomit more than one time. You have stools that are bloody, black, or tarry. Summary Diverticulitis is infection or inflammation of small pouches (diverticula) in the colon that form due to a condition called diverticulosis. Diverticula can trap stool (feces) and bacteria, causing infection and inflammation. You are at higher risk for this condition if you have diverticulosis and you eat a diet that does not include enough fiber. Most cases of this condition are mild and can be treated at home. More severe cases may need to be treated at a hospital. When your condition is under control, your health care provider may recommend that you have an exam called a colonoscopy. This exam can show how severe your diverticula are and whether something else may be causing your symptoms. Keep all follow-up visits as told by your health care provider. This is important. This information is not intended to replace advice given to you by your health care provider. Make sure you discuss any questions you have with your health care provider. Document Revised: 06/08/2019 Document Reviewed: 06/08/2019 Elsevier Patient Education  2023 [employer]  

## 2022-05-09 ENCOUNTER — Encounter: Payer: Medicare Other | Attending: Internal Medicine | Admitting: *Deleted

## 2022-05-09 ENCOUNTER — Encounter: Payer: Self-pay | Admitting: *Deleted

## 2022-05-09 DIAGNOSIS — I213 ST elevation (STEMI) myocardial infarction of unspecified site: Secondary | ICD-10-CM

## 2022-05-09 DIAGNOSIS — Z955 Presence of coronary angioplasty implant and graft: Secondary | ICD-10-CM

## 2022-05-09 NOTE — Progress Notes (Signed)
Virtual orientation call completed today. he has an appointment on Date: 05/17/2022  for EP eval and gym Orientation.  Documentation of diagnosis can be found in [facility]  Date: 04/21/2022 .

## 2022-05-17 ENCOUNTER — Encounter: Payer: Medicare Other | Attending: Internal Medicine | Admitting: *Deleted

## 2022-05-17 VITALS — Ht 68.25 in | Wt 171.7 lb

## 2022-05-17 DIAGNOSIS — Z955 Presence of coronary angioplasty implant and graft: Secondary | ICD-10-CM

## 2022-05-17 DIAGNOSIS — I213 ST elevation (STEMI) myocardial infarction of unspecified site: Secondary | ICD-10-CM

## 2022-05-17 NOTE — Progress Notes (Signed)
Cardiac Individual Treatment Plan  Patient Details  Name: Todd DERSHEM Sr. MRN: [id number] Date of Birth: Aug 27, 1946 Referring Provider:   Flowsheet Row Cardiac Rehab from 05/17/2022 in [facility] Cardiac and Pulmonary Rehab  Referring Provider Lujean Amel MD       Initial Encounter Date:  Flowsheet Row Cardiac Rehab from 05/17/2022 in [facility] Cardiac and Pulmonary Rehab  Date 05/17/22       Visit Diagnosis: ST elevation myocardial infarction (STEMI), unspecified artery [facility])  Status post coronary artery stent placement  Patient's Home Medications on Admission:  Current Outpatient Medications:    amLODipine (NORVASC) 5 MG tablet, Take 5 mg by mouth 2 (two) times daily. (Patient not taking: Reported on 05/09/2022), Disp: , Rfl:    aspirin 81 MG chewable tablet, Chew by mouth., Disp: , Rfl:    atorvastatin (LIPITOR) 80 MG tablet, Take 80 mg by mouth daily., Disp: , Rfl:    clopidogrel (PLAVIX) 75 MG tablet, Take by mouth., Disp: , Rfl:    cyanocobalamin 1000 MCG tablet, Take 1,000 mcg by mouth daily., Disp: , Rfl:    famotidine (PEPCID) 20 MG tablet, Take 1 tablet by mouth 2 (two) times daily., Disp: , Rfl:    isosorbide mononitrate (IMDUR) 60 MG 24 hr tablet, Take 60 mg by mouth daily. (Patient not taking: Reported on 05/09/2022), Disp: , Rfl:    losartan (COZAAR) 25 MG tablet, Take 25 mg by mouth daily., Disp: , Rfl:    metFORMIN (GLUCOPHAGE) 500 MG tablet, Take 500 mg by mouth daily., Disp: , Rfl:    metoprolol succinate (TOPROL-XL) 25 MG 24 hr tablet, Take 12.5 mg by mouth daily., Disp: , Rfl:    propylthiouracil (PTU) 50 MG tablet, Take 25 mg by mouth 2 (two) times daily. (Patient not taking: Reported on 05/09/2022), Disp: , Rfl: 0   Vitamin D, Ergocalciferol, (DRISDOL) 1.25 MG (50000 UNIT) CAPS capsule, Take 50,000 Units by mouth once a week. (Patient not taking: Reported on 05/09/2022), Disp: , Rfl:   Past Medical History: Past Medical History:  Diagnosis Date   Diverticulitis     Diverticulitis of large intestine with abscess without bleeding    Enlarged prostate    Gallstones    Hypertension    Hyperthyroidism     Tobacco Use: Social History   Tobacco Use  Smoking Status Former   Types: Cigarettes   Quit date: 1970   Years since quitting: 53.7   Passive exposure: Never  Smokeless Tobacco Never    Labs: Review Flowsheet       Latest Ref Rng & Units 12/23/2015 08/31/2021  Labs for ITP Cardiac and Pulmonary Rehab  Cholestrol 0 - 200 mg/dL 209  -  LDL (calc) 0 - 99 mg/dL 141  -  HDL-C >40 mg/dL 40  -  Trlycerides <150 mg/dL 142  -  Hemoglobin A1c 4.8 - 5.6 % - 8.5      Exercise Target Goals: Exercise Program Goal: Individual exercise prescription set using results from initial 6 min walk test and THRR while considering  patient's activity barriers and safety.   Exercise Prescription Goal: Initial exercise prescription builds to 30-45 minutes a day of aerobic activity, 2-3 days per week.  Home exercise guidelines will be given to patient during program as part of exercise prescription that the participant will acknowledge.   Education: Aerobic Exercise: - Group verbal and visual presentation on the components of exercise prescription. Introduces F.I.T.T principle from ACSM for exercise prescriptions.  Reviews F.I.T.T. principles of aerobic  exercise including progression. Written material given at graduation.   Education: Resistance Exercise: - Group verbal and visual presentation on the components of exercise prescription. Introduces F.I.T.T principle from ACSM for exercise prescriptions  Reviews F.I.T.T. principles of resistance exercise including progression. Written material given at graduation.    Education: Exercise & Equipment Safety: - Individual verbal instruction and demonstration of equipment use and safety with use of the equipment. Flowsheet Row Cardiac Rehab from 05/17/2022 in [facility] Cardiac and Pulmonary Rehab  Date 05/17/22   Educator [facility]  Instruction Review Code 1- Verbalizes Understanding       Education: Exercise Physiology & General Exercise Guidelines: - Group verbal and written instruction with models to review the exercise physiology of the cardiovascular system and associated critical values. Provides general exercise guidelines with specific guidelines to those with heart or lung disease.    Education: Flexibility, Balance, Mind/Body Relaxation: - Group verbal and visual presentation with interactive activity on the components of exercise prescription. Introduces F.I.T.T principle from ACSM for exercise prescriptions. Reviews F.I.T.T. principles of flexibility and balance exercise training including progression. Also discusses the mind body connection.  Reviews various relaxation techniques to help reduce and manage stress (i.e. Deep breathing, progressive muscle relaxation, and visualization). Balance handout provided to take home. Written material given at graduation.   Activity Barriers & Risk Stratification:  Activity Barriers & Cardiac Risk Stratification - 05/17/22 1136       Activity Barriers & Cardiac Risk Stratification   Activity Barriers Left Knee Replacement;Deconditioning;Balance Concerns;Joint Problems   partial knee, left shoulder surgery previously   Cardiac Risk Stratification High             6 Minute Walk:  6 Minute Walk     Row Name 05/17/22 1135         6 Minute Walk   Phase Initial     Distance 1679 feet     Walk Time 6 minutes     # of Rest Breaks 0     MPH 3.18     METS 3.28     RPE 9     VO2 Peak 11.46     Symptoms No     Resting HR 80 bpm     Resting BP 124/58     Resting Oxygen Saturation  97 %     Exercise Oxygen Saturation  during 6 min walk 94 %     Max Ex. HR 100 bpm     Max Ex. BP 136/74     2 Minute Post BP 122/64              Oxygen Initial Assessment:   Oxygen Re-Evaluation:   Oxygen Discharge (Final Oxygen  Re-Evaluation):   Initial Exercise Prescription:  Initial Exercise Prescription - 05/17/22 1100       Date of Initial Exercise RX and Referring Provider   Date 05/17/22    Referring Provider Dorothyann Peng MD      Treadmill   MPH 3    Grade 0.5    Minutes 15    METs 3.5      Recumbant Bike   Level 2    RPM 50    Watts 30    Minutes 15    METs 3      NuStep   Level 3    SPM 80    Minutes 15    METs 3      REL-XR   Level 1    Speed 50  Minutes 15    METs 3      Track   Laps 43    Minutes 15    METs 3.34      Prescription Details   Frequency (times per week) 3    Duration Progress to 30 minutes of continuous aerobic without signs/symptoms of physical distress      Intensity   THRR 40-80% of Max Heartrate 105-130    Ratings of Perceived Exertion 11-13    Perceived Dyspnea 0-4      Progression   Progression Continue to progress workloads to maintain intensity without signs/symptoms of physical distress.      Resistance Training   Training Prescription Yes    Weight 5 lb    Reps 10-15             Perform Capillary Blood Glucose checks as needed.  Exercise Prescription Changes:   Exercise Prescription Changes     Row Name 05/17/22 1100             Response to Exercise   Blood Pressure (Admit) 124/58       Blood Pressure (Exercise) 136/74       Blood Pressure (Exit) 122/64       Heart Rate (Admit) 80 bpm       Heart Rate (Exercise) 100 bpm       Heart Rate (Exit) 83 bpm       Oxygen Saturation (Admit) 97 %       Oxygen Saturation (Exercise) 94 %       Rating of Perceived Exertion (Exercise) 9       Symptoms none       Comments walk test results                Exercise Comments:   Exercise Goals and Review:   Exercise Goals     Row Name 05/17/22 1143             Exercise Goals   Increase Physical Activity Yes       Intervention Provide advice, education, support and counseling about physical activity/exercise  needs.;Develop an individualized exercise prescription for aerobic and resistive training based on initial evaluation findings, risk stratification, comorbidities and participant's personal goals.       Expected Outcomes Short Term: Attend rehab on a regular basis to increase amount of physical activity.;Long Term: Add in home exercise to make exercise part of routine and to increase amount of physical activity.;Long Term: Exercising regularly at least 3-5 days a week.       Increase Strength and Stamina Yes       Intervention Provide advice, education, support and counseling about physical activity/exercise needs.;Develop an individualized exercise prescription for aerobic and resistive training based on initial evaluation findings, risk stratification, comorbidities and participant's personal goals.       Expected Outcomes Short Term: Increase workloads from initial exercise prescription for resistance, speed, and METs.;Short Term: Perform resistance training exercises routinely during rehab and add in resistance training at home;Long Term: Improve cardiorespiratory fitness, muscular endurance and strength as measured by increased METs and functional capacity ( )       Able to understand and use rate of perceived exertion (RPE) scale Yes       Intervention Provide education and explanation on how to use RPE scale       Expected Outcomes Long Term:  Able to use RPE to guide intensity level when exercising independently;Short Term: Able to use RPE daily in  rehab to express subjective intensity level       Able to understand and use Dyspnea scale Yes       Intervention Provide education and explanation on how to use Dyspnea scale       Expected Outcomes Short Term: Able to use Dyspnea scale daily in rehab to express subjective sense of shortness of breath during exertion;Long Term: Able to use Dyspnea scale to guide intensity level when exercising independently       Knowledge and understanding of  Target Heart Rate Range (THRR) Yes       Intervention Provide education and explanation of THRR including how the numbers were predicted and where they are located for reference       Expected Outcomes Short Term: Able to state/look up THRR;Short Term: Able to use daily as guideline for intensity in rehab;Long Term: Able to use THRR to govern intensity when exercising independently       Able to check pulse independently Yes       Intervention Provide education and demonstration on how to check pulse in carotid and radial arteries.;Review the importance of being able to check your own pulse for safety during independent exercise       Expected Outcomes Short Term: Able to explain why pulse checking is important during independent exercise;Long Term: Able to check pulse independently and accurately       Understanding of Exercise Prescription Yes       Intervention Provide education, explanation, and written materials on patient's individual exercise prescription       Expected Outcomes Short Term: Able to explain program exercise prescription;Long Term: Able to explain home exercise prescription to exercise independently                Exercise Goals Re-Evaluation :   Discharge Exercise Prescription (Final Exercise Prescription Changes):  Exercise Prescription Changes - 05/17/22 1100       Response to Exercise   Blood Pressure (Admit) 124/58    Blood Pressure (Exercise) 136/74    Blood Pressure (Exit) 122/64    Heart Rate (Admit) 80 bpm    Heart Rate (Exercise) 100 bpm    Heart Rate (Exit) 83 bpm    Oxygen Saturation (Admit) 97 %    Oxygen Saturation (Exercise) 94 %    Rating of Perceived Exertion (Exercise) 9    Symptoms none    Comments walk test results             Nutrition:  Target Goals: Understanding of nutrition guidelines, daily intake of sodium 1500mg , cholesterol 200mg , calories 30% from fat and 7% or less from saturated fats, daily to have 5 or more servings  of fruits and vegetables.  Education: All About Nutrition: -Group instruction provided by verbal, written material, interactive activities, discussions, models, and posters to present general guidelines for heart healthy nutrition including fat, fiber, MyPlate, the role of sodium in heart healthy nutrition, utilization of the nutrition label, and utilization of this knowledge for meal planning. Follow up email sent as well. Written material given at graduation. Flowsheet Row Cardiac Rehab from 05/17/2022 in [facility] Cardiac and Pulmonary Rehab  Education need identified 05/17/22       Biometrics:  Pre Biometrics - 05/17/22 1144       Pre Biometrics   Height 5' 8.25" (1.734 m)    Weight 171 lb 11.2 oz (77.9 kg)    Waist Circumference 35 inches    Hip Circumference 39.5 inches    Waist  to Hip Ratio 0.89 %    BMI (Calculated) 25.9    Single Leg Stand 5.8 seconds              Nutrition Therapy Plan and Nutrition Goals:  Nutrition Therapy & Goals - 05/17/22 1145       Intervention Plan   Intervention Prescribe, educate and counsel regarding individualized specific dietary modifications aiming towards targeted core components such as weight, hypertension, lipid management, diabetes, heart failure and other comorbidities.    Expected Outcomes Short Term Goal: Understand basic principles of dietary content, such as calories, fat, sodium, cholesterol and nutrients.;Short Term Goal: A plan has been developed with personal nutrition goals set during dietitian appointment.;Long Term Goal: Adherence to prescribed nutrition plan.             Nutrition Assessments:  MEDIFICTS Score Key: ?70 Need to make dietary changes  40-70 Heart Healthy Diet ? 40 Therapeutic Level Cholesterol Diet  Flowsheet Row Cardiac Rehab from 05/17/2022 in [facility] Cardiac and Pulmonary Rehab  Picture Your Plate Total Score on Admission 53      Picture Your Plate Scores: <81 Unhealthy dietary pattern with much  room for improvement. 41-50 Dietary pattern unlikely to meet recommendations for good health and room for improvement. 51-60 More healthful dietary pattern, with some room for improvement.  >60 Healthy dietary pattern, although there may be some specific behaviors that could be improved.    Nutrition Goals Re-Evaluation:   Nutrition Goals Discharge (Final Nutrition Goals Re-Evaluation):   Psychosocial: Target Goals: Acknowledge presence or absence of significant depression and/or stress, maximize coping skills, provide positive support system. Participant is able to verbalize types and ability to use techniques and skills needed for reducing stress and depression.   Education: Stress, Anxiety, and Depression - Group verbal and visual presentation to define topics covered.  Reviews how body is impacted by stress, anxiety, and depression.  Also discusses healthy ways to reduce stress and to treat/manage anxiety and depression.  Written material given at graduation.   Education: Sleep Hygiene -Provides group verbal and written instruction about how sleep can affect your health.  Define sleep hygiene, discuss sleep cycles and impact of sleep habits. Review good sleep hygiene tips.    Initial Review & Psychosocial Screening:  Initial Psych Review & Screening - 05/09/22 1317       Initial Review   Current issues with None Identified      Family Dynamics   Good Support System? Yes   wife, daughter, son  and dog     Barriers   Psychosocial barriers to participate in program There are no identifiable barriers or psychosocial needs.      Screening Interventions   Interventions Encouraged to exercise    Expected Outcomes Short Term goal: Utilizing psychosocial counselor, staff and physician to assist with identification of specific Stressors or current issues interfering with healing process. Setting desired goal for each stressor or current issue identified.;Long Term Goal: Stressors or  current issues are controlled or eliminated.;Short Term goal: Identification and review with participant of any Quality of Life or Depression concerns found by scoring the questionnaire.;Long Term goal: The participant improves quality of Life and PHQ9 Scores as seen by post scores and/or verbalization of changes             Quality of Life Scores:   Quality of Life - 05/17/22 1145       Quality of Life   Select Quality of Life  Quality of Life Scores   Health/Function Pre 27.4 %    Socioeconomic Pre 28.5 %    Psych/Spiritual Pre 30 %    Family Pre 28.8 %    GLOBAL Pre 28.37 %            Scores of 19 and below usually indicate a poorer quality of life in these areas.  A difference of  2-3 points is a clinically meaningful difference.  A difference of 2-3 points in the total score of the Quality of Life Index has been associated with significant improvement in overall quality of life, self-image, physical symptoms, and general health in studies assessing change in quality of life.  PHQ-9: Review Flowsheet       05/17/2022  Depression screen PHQ 2/9  Decreased Interest 0  Down, Depressed, Hopeless 0  PHQ - 2 Score 0  Altered sleeping 0  Tired, decreased energy 0  Change in appetite 0  Feeling bad or failure about yourself  0  Trouble concentrating 0  Moving slowly or fidgety/restless 0  Suicidal thoughts 0  PHQ-9 Score 0  Difficult doing work/chores Not difficult at all   Interpretation of Total Score  Total Score Depression Severity:  1-4 = Minimal depression, 5-9 = Mild depression, 10-14 = Moderate depression, 15-19 = Moderately severe depression, 20-27 = Severe depression   Psychosocial Evaluation and Intervention:  Psychosocial Evaluation - 05/09/22 1330       Psychosocial Evaluation & Interventions   Interventions Encouraged to exercise with the program and follow exercise prescription    Comments Amor has no barriers to attending the program.  He is  ready to attend.Marland Kitchen He lives with his wife and he has a dog at home. His support is his wife and children and his "Lequita Halt" (dog). He has been active up to hisds heart attack and plans to return to his golf and bowling.   He is hard of hearing and the hearing aids he received from the [place] don't work well.  He plans on getting them checked at the [place].    Expected Outcomes STG Comer attends all scheduled sessions, he is able to return to his daily activities. LTG Lou continues to progress with his exercise to enjoy his activities    Continue Psychosocial Services  Follow up required by staff             Psychosocial Re-Evaluation:   Psychosocial Discharge (Final Psychosocial Re-Evaluation):   Vocational Rehabilitation: Provide vocational rehab assistance to qualifying candidates.   Vocational Rehab Evaluation & Intervention:   Education: Education Goals: Education classes will be provided on a variety of topics geared toward better understanding of heart health and risk factor modification. Participant will state understanding/return demonstration of topics presented as noted by education test scores.  Learning Barriers/Preferences:   General Cardiac Education Topics:  AED/CPR: - Group verbal and written instruction with the use of models to demonstrate the basic use of the AED with the basic ABC's of resuscitation.   Anatomy and Cardiac Procedures: - Group verbal and visual presentation and models provide information about basic cardiac anatomy and function. Reviews the testing methods done to diagnose heart disease and the outcomes of the test results. Describes the treatment choices: Medical Management, Angioplasty, or Coronary Bypass Surgery for treating various heart conditions including Myocardial Infarction, Angina, Valve Disease, and Cardiac Arrhythmias.  Written material given at graduation.   Medication Safety: - Group verbal and visual instruction to review commonly  prescribed medications for heart  and lung disease. Reviews the medication, class of the drug, and side effects. Includes the steps to properly store meds and maintain the prescription regimen.  Written material given at graduation.   Intimacy: - Group verbal instruction through game format to discuss how heart and lung disease can affect sexual intimacy. Written material given at graduation..   Know Your Numbers and Heart Failure: - Group verbal and visual instruction to discuss disease risk factors for cardiac and pulmonary disease and treatment options.  Reviews associated critical values for Overweight/Obesity, Hypertension, Cholesterol, and Diabetes.  Discusses basics of heart failure: signs/symptoms and treatments.  Introduces Heart Failure Zone chart for action plan for heart failure.  Written material given at graduation. Flowsheet Row Cardiac Rehab from 05/17/2022 in [facility] Cardiac and Pulmonary Rehab  Education need identified 05/17/22       Infection Prevention: - Provides verbal and written material to individual with discussion of infection control including proper hand washing and proper equipment cleaning during exercise session. Flowsheet Row Cardiac Rehab from 05/17/2022 in [facility] Cardiac and Pulmonary Rehab  Date 05/17/22  Educator [facility]  Instruction Review Code 1- Verbalizes Understanding       Falls Prevention: - Provides verbal and written material to individual with discussion of falls prevention and safety. Flowsheet Row Cardiac Rehab from 05/17/2022 in [facility] Cardiac and Pulmonary Rehab  Date 05/09/22  Educator SB  Instruction Review Code 1- Verbalizes Understanding       Other: -Provides group and verbal instruction on various topics (see comments)   Knowledge Questionnaire Score:  Knowledge Questionnaire Score - 05/17/22 1145       Knowledge Questionnaire Score   Pre Score 24/26             Core Components/Risk Factors/Patient Goals at Admission:   Personal Goals and Risk Factors at Admission - 05/17/22 1145       Core Components/Risk Factors/Patient Goals on Admission    Weight Management Yes;Weight Loss    Intervention Weight Management: Provide education and appropriate resources to help participant work on and attain dietary goals.;Weight Management: Develop a combined nutrition and exercise program designed to reach desired caloric intake, while maintaining appropriate intake of nutrient and fiber, sodium and fats, and appropriate energy expenditure required for the weight goal.;Weight Management/Obesity: Establish reasonable short term and long term weight goals.    Admit Weight 171 lb 11.2 oz (77.9 kg)    Goal Weight: Short Term 165 lb (74.8 kg)    Goal Weight: Long Term 165 lb (74.8 kg)    Expected Outcomes Short Term: Continue to assess and modify interventions until short term weight is achieved;Long Term: Adherence to nutrition and physical activity/exercise program aimed toward attainment of established weight goal;Weight Maintenance: Understanding of the daily nutrition guidelines, which includes 25-35% calories from fat, 7% or less cal from saturated fats, less than  cholesterol, less than 1.5gm of sodium, & 5 or more servings of fruits and vegetables daily;Weight Loss: Understanding of general recommendations for a balanced deficit meal plan, which promotes 1-2 lb weight loss per week and includes a negative energy balance of [phone] kcal/d;Understanding recommendations for meals to include 15-35% energy as protein, 25-35% energy from fat, 35-60% energy from carbohydrates, less than  of dietary cholesterol, 20-35 gm of total fiber daily;Understanding of distribution of calorie intake throughout the day with the consumption of 4-5 meals/snacks    Diabetes Yes    Intervention Provide education about signs/symptoms and action to take for hypo/hyperglycemia.;Provide education about proper nutrition,  including hydration, and  aerobic/resistive exercise prescription along with prescribed medications to achieve blood glucose in normal ranges: Fasting glucose 65-99 mg/dL    Expected Outcomes Short Term: Participant verbalizes understanding of the signs/symptoms and immediate care of hyper/hypoglycemia, proper foot care and importance of medication, aerobic/resistive exercise and nutrition plan for blood glucose control.;Long Term: Attainment of HbA1C < 7%.    Hypertension Yes    Intervention Provide education on lifestyle modifcations including regular physical activity/exercise, weight management, moderate sodium restriction and increased consumption of fresh fruit, vegetables, and low fat dairy, alcohol moderation, and smoking cessation.;Monitor prescription use compliance.    Expected Outcomes Short Term: Continued assessment and intervention until BP is < 140/59mm HG in hypertensive participants. < 130/18mm HG in hypertensive participants with diabetes, heart failure or chronic kidney disease.;Long Term: Maintenance of blood pressure at goal levels.    Lipids Yes    Intervention Provide education and support for participant on nutrition & aerobic/resistive exercise along with prescribed medications to achieve LDL 70mg , HDL >40mg .    Expected Outcomes Short Term: Participant states understanding of desired cholesterol values and is compliant with medications prescribed. Participant is following exercise prescription and nutrition guidelines.;Long Term: Cholesterol controlled with medications as prescribed, with individualized exercise RX and with personalized nutrition plan. Value goals: LDL < 70mg , HDL > 40 mg.             Education:Diabetes - Individual verbal and written instruction to review signs/symptoms of diabetes, desired ranges of glucose level fasting, after meals and with exercise. Acknowledge that pre and post exercise glucose checks will be done for 3 sessions at entry of program. Flowsheet Row Cardiac  Rehab from 05/17/2022 in [facility] Cardiac and Pulmonary Rehab  Date 05/17/22  Educator [facility]  Instruction Review Code 1- Verbalizes Understanding       Core Components/Risk Factors/Patient Goals Review:    Core Components/Risk Factors/Patient Goals at Discharge (Final Review):    ITP Comments:  ITP Comments     Row Name 05/09/22 1329 05/17/22 1135         ITP Comments Virtual orientation call completed today. he has an appointment on Date: 05/17/2022  for EP eval and gym Orientation.  Documentation of diagnosis can be found in [facility]  Date: 04/21/2022 . Completed 06/21/2022 and gym orientation. Initial ITP created and sent for review to Dr. , Medical Director.               Comments: Initial ITP

## 2022-05-17 NOTE — Patient Instructions (Signed)
Patient Instructions  Patient Details  Name: Todd BIGLEY Sr. MRN: [id number] Date of Birth: 1946-06-01 Referring Provider:  Alwyn Pea, MD  Below are your personal goals for exercise, nutrition, and risk factors. Our goal is to help you stay on track towards obtaining and maintaining these goals. We will be discussing your progress on these goals with you throughout the program.  Initial Exercise Prescription:  Initial Exercise Prescription - 05/17/22 1100       Date of Initial Exercise RX and Referring Provider   Date 05/17/22    Referring Provider Dorothyann Peng MD      Treadmill   MPH 3    Grade 0.5    Minutes 15    METs 3.5      Recumbant Bike   Level 2    RPM 50    Watts 30    Minutes 15    METs 3      NuStep   Level 3    SPM 80    Minutes 15    METs 3      REL-XR   Level 1    Speed 50    Minutes 15    METs 3      Track   Laps 43    Minutes 15    METs 3.34      Prescription Details   Frequency (times per week) 3    Duration Progress to 30 minutes of continuous aerobic without signs/symptoms of physical distress      Intensity   THRR 40-80% of Max Heartrate 105-130    Ratings of Perceived Exertion 11-13    Perceived Dyspnea 0-4      Progression   Progression Continue to progress workloads to maintain intensity without signs/symptoms of physical distress.      Resistance Training   Training Prescription Yes    Weight 5 lb    Reps 10-15             Exercise Goals: Frequency: Be able to perform aerobic exercise two to three times per week in program working toward 2-5 days per week of home exercise.  Intensity: Work with a perceived exertion of 11 (fairly light) - 15 (hard) while following your exercise prescription.  We will make changes to your prescription with you as you progress through the program.   Duration: Be able to do 30 to 45 minutes of continuous aerobic exercise in addition to a 5 minute warm-up and a 5 minute  cool-down routine.   Nutrition Goals: Your personal nutrition goals will be established when you do your nutrition analysis with the dietician.  The following are general nutrition guidelines to follow: Cholesterol < 200mg /day Sodium < 1500mg /day Fiber: Men over 50 yrs - 30 grams per day  Personal Goals:  Personal Goals and Risk Factors at Admission - 05/17/22 1145       Core Components/Risk Factors/Patient Goals on Admission    Weight Management Yes;Weight Loss    Intervention Weight Management: Provide education and appropriate resources to help participant work on and attain dietary goals.;Weight Management: Develop a combined nutrition and exercise program designed to reach desired caloric intake, while maintaining appropriate intake of nutrient and fiber, sodium and fats, and appropriate energy expenditure required for the weight goal.;Weight Management/Obesity: Establish reasonable short term and long term weight goals.    Admit Weight 171 lb 11.2 oz (77.9 kg)    Goal Weight: Short Term 165 lb (74.8 kg)    Goal  Weight: Long Term 165 lb (74.8 kg)    Expected Outcomes Short Term: Continue to assess and modify interventions until short term weight is achieved;Long Term: Adherence to nutrition and physical activity/exercise program aimed toward attainment of established weight goal;Weight Maintenance: Understanding of the daily nutrition guidelines, which includes 25-35% calories from fat, 7% or less cal from saturated fats, less than 200mg  cholesterol, less than 1.5gm of sodium, & 5 or more servings of fruits and vegetables daily;Weight Loss: Understanding of general recommendations for a balanced deficit meal plan, which promotes 1-2 lb weight loss per week and includes a negative energy balance of [phone] kcal/d;Understanding recommendations for meals to include 15-35% energy as protein, 25-35% energy from fat, 35-60% energy from carbohydrates, less than 200mg  of dietary cholesterol, 20-35  gm of total fiber daily;Understanding of distribution of calorie intake throughout the day with the consumption of 4-5 meals/snacks    Diabetes Yes    Intervention Provide education about signs/symptoms and action to take for hypo/hyperglycemia.;Provide education about proper nutrition, including hydration, and aerobic/resistive exercise prescription along with prescribed medications to achieve blood glucose in normal ranges: Fasting glucose 65-99 mg/dL    Expected Outcomes Short Term: Participant verbalizes understanding of the signs/symptoms and immediate care of hyper/hypoglycemia, proper foot care and importance of medication, aerobic/resistive exercise and nutrition plan for blood glucose control.;Long Term: Attainment of HbA1C < 7%.    Hypertension Yes    Intervention Provide education on lifestyle modifcations including regular physical activity/exercise, weight management, moderate sodium restriction and increased consumption of fresh fruit, vegetables, and low fat dairy, alcohol moderation, and smoking cessation.;Monitor prescription use compliance.    Expected Outcomes Short Term: Continued assessment and intervention until BP is < 140/74mm HG in hypertensive participants. < 130/71mm HG in hypertensive participants with diabetes, heart failure or chronic kidney disease.;Long Term: Maintenance of blood pressure at goal levels.    Lipids Yes    Intervention Provide education and support for participant on nutrition & aerobic/resistive exercise along with prescribed medications to achieve LDL 70mg , HDL >40mg .    Expected Outcomes Short Term: Participant states understanding of desired cholesterol values and is compliant with medications prescribed. Participant is following exercise prescription and nutrition guidelines.;Long Term: Cholesterol controlled with medications as prescribed, with individualized exercise RX and with personalized nutrition plan. Value goals: LDL < 70mg , HDL > 40 mg.              Tobacco Use Initial Evaluation: Social History   Tobacco Use  Smoking Status Former   Types: Cigarettes   Quit date: 1970   Years since quitting: 53.7   Passive exposure: Never  Smokeless Tobacco Never    Exercise Goals and Review:  Exercise Goals     Row Name 05/17/22 1143             Exercise Goals   Increase Physical Activity Yes       Intervention Provide advice, education, support and counseling about physical activity/exercise needs.;Develop an individualized exercise prescription for aerobic and resistive training based on initial evaluation findings, risk stratification, comorbidities and participant's personal goals.       Expected Outcomes Short Term: Attend rehab on a regular basis to increase amount of physical activity.;Long Term: Add in home exercise to make exercise part of routine and to increase amount of physical activity.;Long Term: Exercising regularly at least 3-5 days a week.       Increase Strength and Stamina Yes       Intervention Provide advice, education,  support and counseling about physical activity/exercise needs.;Develop an individualized exercise prescription for aerobic and resistive training based on initial evaluation findings, risk stratification, comorbidities and participant's personal goals.       Expected Outcomes Short Term: Increase workloads from initial exercise prescription for resistance, speed, and METs.;Short Term: Perform resistance training exercises routinely during rehab and add in resistance training at home;Long Term: Improve cardiorespiratory fitness, muscular endurance and strength as measured by increased METs and functional capacity ( )       Able to understand and use rate of perceived exertion (RPE) scale Yes       Intervention Provide education and explanation on how to use RPE scale       Expected Outcomes Long Term:  Able to use RPE to guide intensity level when exercising independently;Short Term: Able to use  RPE daily in rehab to express subjective intensity level       Able to understand and use Dyspnea scale Yes       Intervention Provide education and explanation on how to use Dyspnea scale       Expected Outcomes Short Term: Able to use Dyspnea scale daily in rehab to express subjective sense of shortness of breath during exertion;Long Term: Able to use Dyspnea scale to guide intensity level when exercising independently       Knowledge and understanding of Target Heart Rate Range (THRR) Yes       Intervention Provide education and explanation of THRR including how the numbers were predicted and where they are located for reference       Expected Outcomes Short Term: Able to state/look up THRR;Short Term: Able to use daily as guideline for intensity in rehab;Long Term: Able to use THRR to govern intensity when exercising independently       Able to check pulse independently Yes       Intervention Provide education and demonstration on how to check pulse in carotid and radial arteries.;Review the importance of being able to check your own pulse for safety during independent exercise       Expected Outcomes Short Term: Able to explain why pulse checking is important during independent exercise;Long Term: Able to check pulse independently and accurately       Understanding of Exercise Prescription Yes       Intervention Provide education, explanation, and written materials on patient's individual exercise prescription       Expected Outcomes Short Term: Able to explain program exercise prescription;Long Term: Able to explain home exercise prescription to exercise independently                Copy of goals given to participant.

## 2022-05-20 IMAGING — CT CT ABD-PELV W/ CM
2 of 5 series · 13 of 46 positions shown, 15 images · IV contrast (APPLIED)
Comparison: CT abdomen pelvis-08/31/2021; CT-guided per drainage
catheter placement x2-12/23/2322

CLINICAL DATA: Abscess diverticular abscess, post percutaneous
drainage catheter placement x2 on 09/01/2021.

EXAM:
CT ABDOMEN AND PELVIS WITH CONTRAST
TECHNIQUE: Multidetector CT imaging of the abdomen and pelvis was performed
using the standard protocol following bolus administration of
intravenous contrast.
CONTRAST:  100mL OMNIPAQUE IOHEXOL 300 MG/ML  SOLN

[Series 2: axial st · axial · 0.78mm/px · z∈[-494,-39]mm · 10 of 103 slices shown, 12 images]
[im 6/103  soft-tissue]
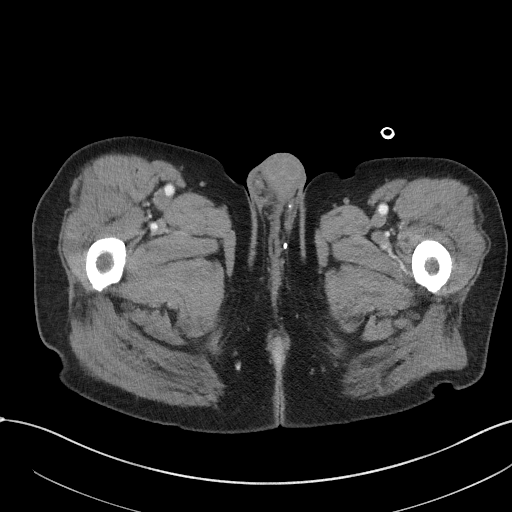
[im 6/103  bone]
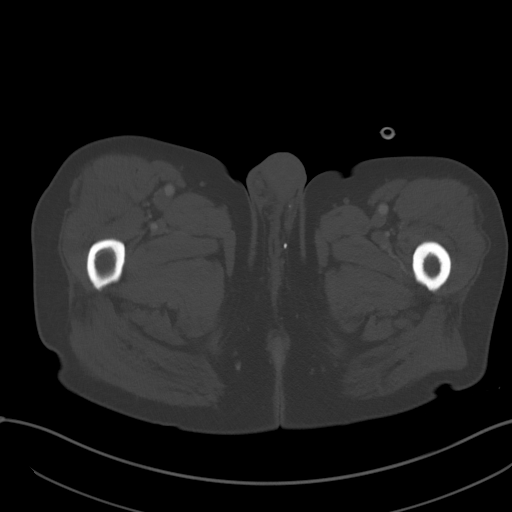
[im 17/103  soft-tissue]
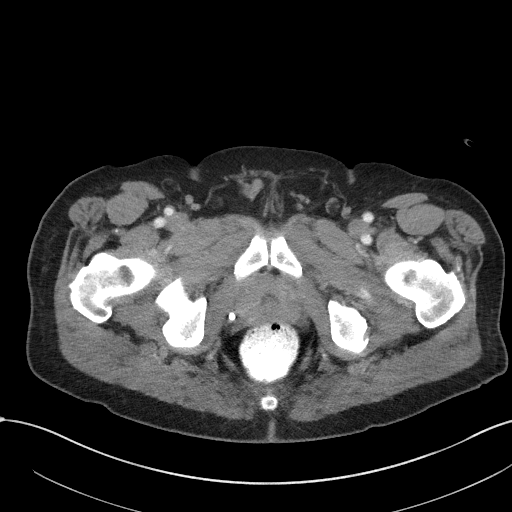
[im 27/103  soft-tissue]
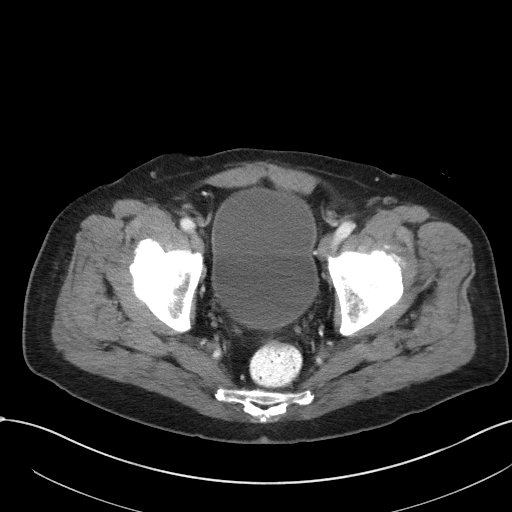
[im 38/103  soft-tissue]
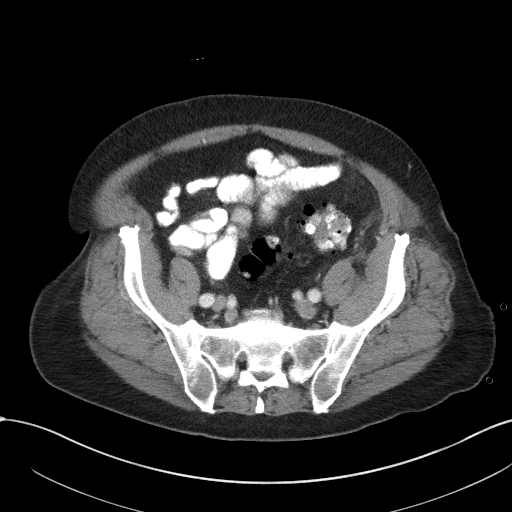
[im 49/103  soft-tissue]
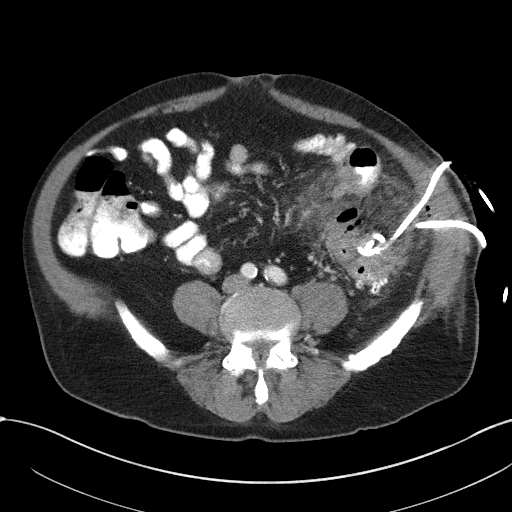
[im 54/103  soft-tissue]
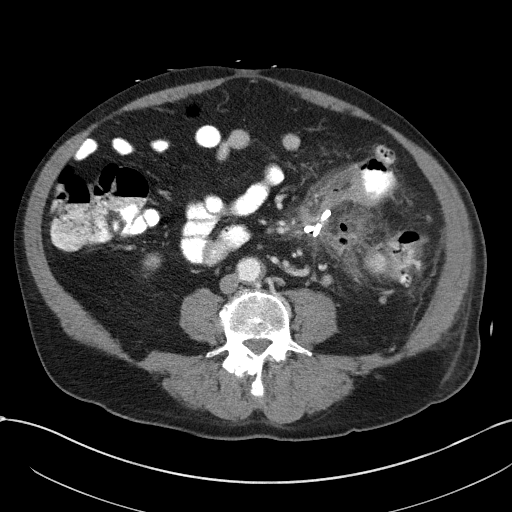
[im 65/103  soft-tissue]
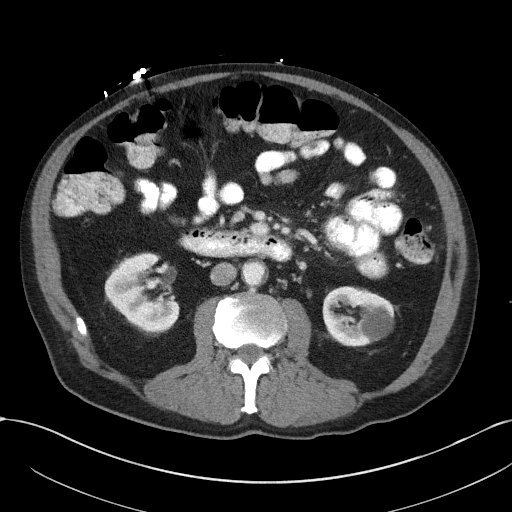
[im 76/103  soft-tissue]
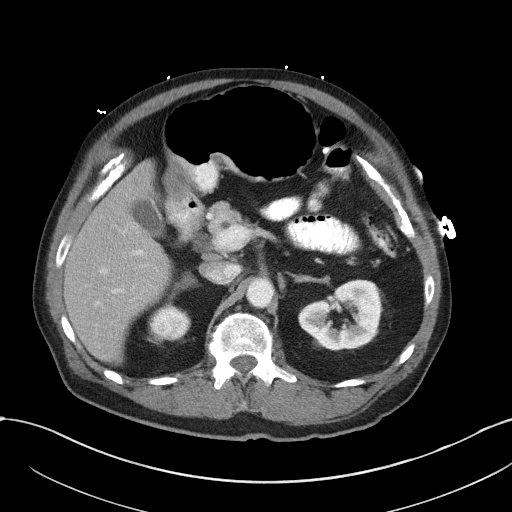
[im 86/103  soft-tissue]
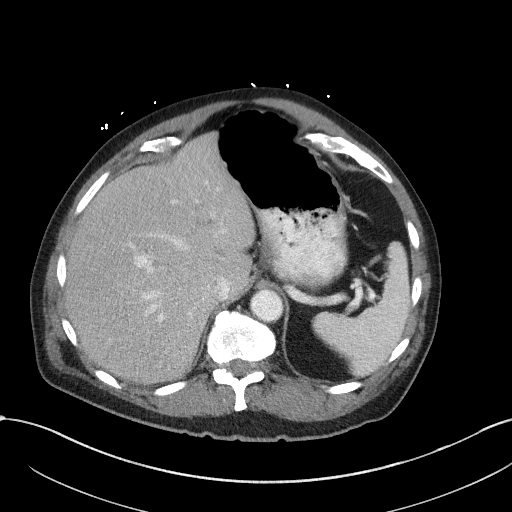
[im 86/103  bone]
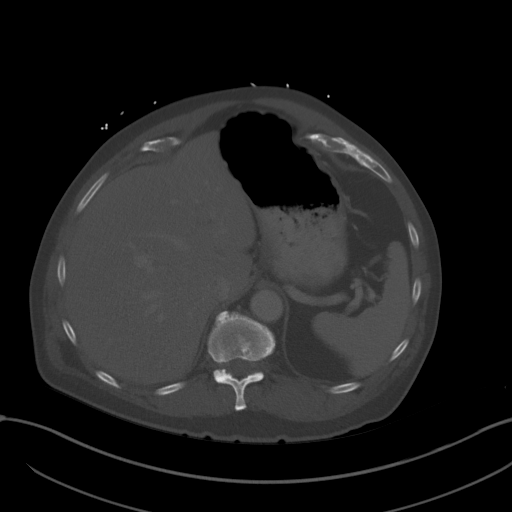
[im 97/103  soft-tissue]
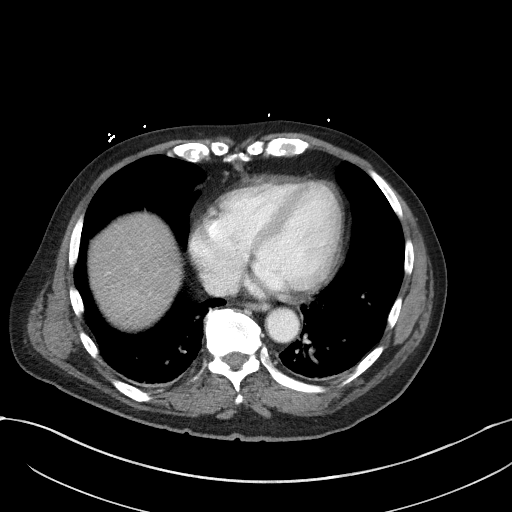

[Series 5: coronal st · coronal · 0.78mm/px · 3 of 100 slices shown]
[im 34/100  soft-tissue]
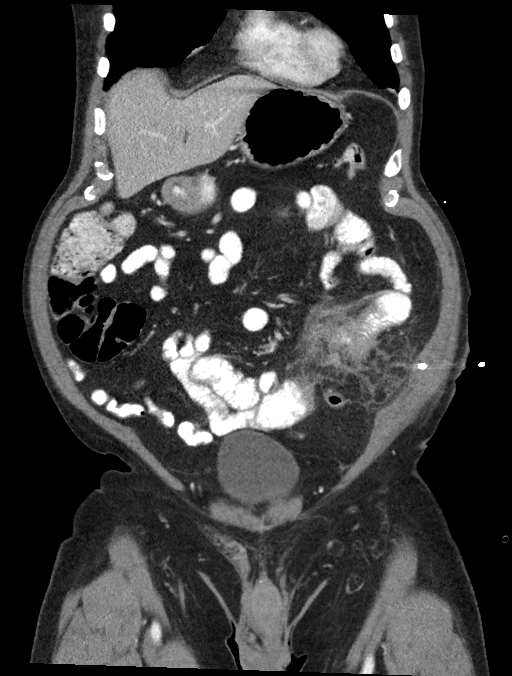
[im 45/100  soft-tissue]
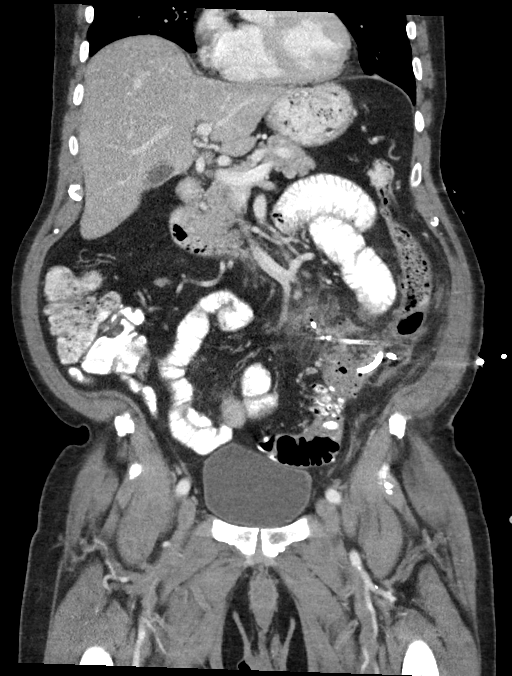
[im 56/100  soft-tissue]
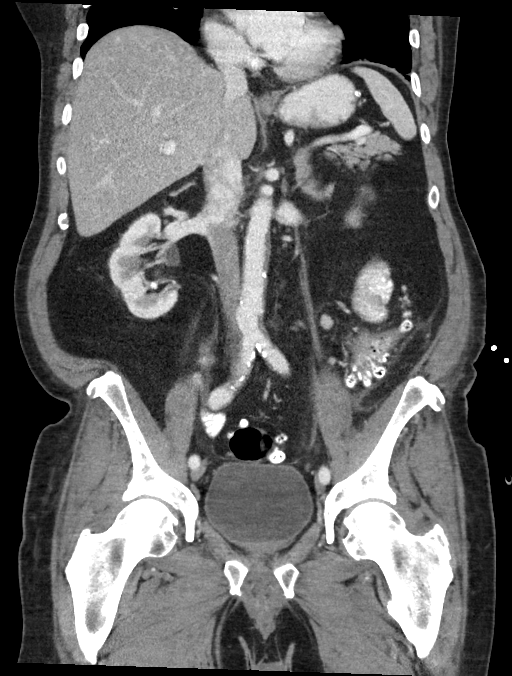

[13 of 46 positions shown; findings below may reference images not displayed]

FINDINGS: Lower chest: Limited visualization of the lower thorax demonstrates
minimal dependent subpleural ground-glass atelectasis, most
conspicuous within the left lower lobe.

Normal heart size.  No pericardial effusion.

Hepatobiliary: Normal hepatic contour. There is diffuse decreased
attenuation of the hepatic parenchyma suggestive of hepatic
steatosis. There is a punctate (approximately 0.6 cm)
hypoattenuating lesion within the dome of the right lobe of the
liver (image 15, series 2, which is too small to adequately
characterize though favored to represent a hepatic cyst. Normal
appearance of the gallbladder given degree distention. No radiopaque
gallstones. No intra or extrahepatic biliary duct dilatation. No
ascites.

Pancreas: Normal appearance of the pancreas.

Spleen: Normal appearance of the spleen.

Adrenals/Urinary Tract: There is symmetric enhancement and excretion
of the bilateral kidneys. No evidence of nephrolithiasis on this
postcontrast examination. Note is made of an approximately 3.1 cm
hypoattenuating left-sided renal cyst (image 29, series 7).
Additional subcentimeter hypoattenuating renal lesions are too small
to accurately characterize though favored to represent additional
renal cysts.

Normal appearance of the bilateral adrenal glands.

Normal appearance of the urinary bladder given degree of distention.

Stomach/Bowel: Stable positioning of both left-sided percutaneous
drainage catheters. There is near complete resolution of previously
noted dominant interloop abscess within the left lower
abdomen/pelvis as the smaller pericolonic abscess adjacent to the
distal descending colon following percutaneous drainage catheter
placement. A moderate amount of residual mesenteric stranding
persists. Scattered adjacent foci of subcutaneous air may represent
the sequela of drainage catheter flushing.

There is wall thickening involving the mesenteric portion of an
adjacent loop of small bowel (image 52, series 2), with adjacent
small foci of subcutaneous emphysema.

No new definable/drainable fluid collection within the abdomen or
pelvis.

Redemonstrated extensive colonic diverticulosis. Ingested enteric
contrast extends to the level of the rectum. Large colonic stool
burden without evidence of enteric obstruction. No hiatal hernia. No
pneumatosis or portal venous gas.

Vascular/Lymphatic: Moderate to large amount of irregular mixed
calcified and noncalcified atherosclerotic plaque within a mildly
tortuous but normal caliber abdominal aorta. The major branch
vessels of the abdominal aorta appear patent on this non CTA
examination.

No bulky retroperitoneal, mesenteric, pelvic or inguinal
lymphadenopathy.

Reproductive: Redemonstrated distension of the prosthetic portion of
the urethra. Otherwise, normal appearance the prostate gland. No
free fluid the pelvic cul-de-sac.

Other: Minimal amount of subcutaneous edema about the left lateral
abdominal wall.

Musculoskeletal: No acute or aggressive osseous abnormalities.
Stigmata of dish within the lower thoracic spine. Mild-to-moderate
multilevel lumbar spine DDD, worse at L5-S1 with disc space height
loss, endplate irregularity and sclerosis. Mild-to-moderate
degenerative change the bilateral hips with joint space loss,
subchondral sclerosis and osteophytosis. Note is made of a small os
acetabuli bilaterally.
IMPRESSION: 1. Interval reduction/resolution of dominant interloop abscess and
pericolonic abscess following image guided percutaneous drainage
catheter placement. While a moderate amount of residual mesenteric
stranding persists, there are no new definable/drainable fluid
collections within the abdomen or pelvis.
2. Redemonstrated wall thickening involving the mesenteric surface
of an adjacent loop of small bowel with an adjacent foci of
subcutaneous emphysema - nonspecific though could represent a
concomitant area of bowel perforation (in addition to the suspected
diverticular abscess arising from the anterior surface of the
descending colon).
3. Extensive colonic diverticulosis.
4. Large colonic stool burden without evidence of enteric
obstruction.
5. Suspected hepatic steatosis.  Correlation with LFTs is advised.
6.  Aortic Atherosclerosis (04IQ9-LNV.V).

PLAN:
- Given the amount of persistent mesenteric stranding, do not
recommend drainage catheter removal at this time.

- Follow-up CT scan of the abdomen and pelvis in 1 week ([REDACTED]) followed by potential drainage catheter injection is advised.

- Maintain continued flushing of the drainage catheter with 10 cc
normal saline twice per day.

- Recommend continued diligent monitoring and recording of daily
drainage catheter output.

## 2022-05-22 ENCOUNTER — Ambulatory Visit: Payer: Medicare Other

## 2022-05-24 ENCOUNTER — Ambulatory Visit: Payer: Medicare Other

## 2022-05-25 ENCOUNTER — Other Ambulatory Visit: Payer: Self-pay | Admitting: Internal Medicine

## 2022-05-25 ENCOUNTER — Ambulatory Visit: Payer: Medicare Other

## 2022-05-25 DIAGNOSIS — R7989 Other specified abnormal findings of blood chemistry: Secondary | ICD-10-CM

## 2022-05-29 ENCOUNTER — Encounter: Payer: Medicare Other | Admitting: *Deleted

## 2022-05-29 ENCOUNTER — Ambulatory Visit: Payer: Medicare Other

## 2022-05-29 DIAGNOSIS — I213 ST elevation (STEMI) myocardial infarction of unspecified site: Secondary | ICD-10-CM | POA: Diagnosis not present

## 2022-05-29 DIAGNOSIS — Z955 Presence of coronary angioplasty implant and graft: Secondary | ICD-10-CM

## 2022-05-29 LAB — GLUCOSE, CAPILLARY
Glucose-Capillary: 119 mg/dL — ABNORMAL HIGH (ref 70–99)
Glucose-Capillary: 150 mg/dL — ABNORMAL HIGH (ref 70–99)

## 2022-05-29 NOTE — Progress Notes (Signed)
Daily Session Note  Patient Details  Name: Todd KIMOTO Sr. MRN: [id number] Date of Birth: 1946-08-15 Referring Provider:   Flowsheet Row Cardiac Rehab from 05/17/2022 in [facility] Cardiac and Pulmonary Rehab  Referring Provider Lujean Amel MD       Encounter Date: 05/29/2022  Check In:  Session Check In - 05/29/22 0804       Check-In   Supervising physician immediately available to respond to emergencies See telemetry face sheet for immediately available ER MD    Location [facility]-Cardiac & Pulmonary Rehab    Staff Present Heath Lark, RN, BSN, CCRP;Melissa Oakwood, RDN, LDN;Jessica Pembroke, MA, RCEP, CCRP, CCET;Noah Tickle, BS, Exercise Physiologist    Virtual Visit No    Medication changes reported     No    Fall or balance concerns reported    No    Warm-up and Cool-down Performed on first and last piece of equipment    Resistance Training Performed Yes    VAD Patient? No    PAD/SET Patient? No      Pain Assessment   Currently in Pain? No/denies                Social History   Tobacco Use  Smoking Status Former   Types: Cigarettes   Quit date: 1970   Years since quitting: 53.7   Passive exposure: Never  Smokeless Tobacco Never    Goals Met:  Exercise tolerated well Personal goals reviewed No report of concerns or symptoms today  Goals Unmet:  Not Applicable  Comments: First full day of exercise!  Patient was oriented to gym and equipment including functions, settings, policies, and procedures.  Patient's individual exercise prescription and treatment plan were reviewed.  All starting workloads were established based on the results of the 6 minute walk test done at initial orientation visit.  The plan for exercise progression was also introduced and progression will be customized based on patient's performance and goals.    Dr. Emily Filbert is Medical Director for [place].  Dr. Ottie Glazier is Medical Director for [facility]  Pulmonary Rehabilitation.

## 2022-05-31 ENCOUNTER — Ambulatory Visit: Payer: Medicare Other

## 2022-05-31 ENCOUNTER — Ambulatory Visit
Admission: RE | Admit: 2022-05-31 | Discharge: 2022-05-31 | Disposition: A | Discharge status: Home / Self Care | Payer: Medicare Other | Source: Ambulatory Visit | Attending: Internal Medicine | Admitting: Internal Medicine

## 2022-05-31 DIAGNOSIS — R7989 Other specified abnormal findings of blood chemistry: Secondary | ICD-10-CM | POA: Diagnosis present

## 2022-06-01 ENCOUNTER — Ambulatory Visit: Payer: Medicare Other

## 2022-06-03 IMAGING — CT CT ABD-PELV W/ CM
2 of 5 series · 16 of 46 positions shown, 18 images · IV contrast (APPLIED)
Comparison: September 04, 2021.

CLINICAL DATA: Diverticulitis and abscess.

EXAM:
CT ABDOMEN AND PELVIS WITH CONTRAST
TECHNIQUE: Multidetector CT imaging of the abdomen and pelvis was performed
using the standard protocol following bolus administration of
intravenous contrast.
CONTRAST:  100mL OMNIPAQUE IOHEXOL 300 MG/ML  SOLN

[Series 5: coronal st · coronal · 0.76mm/px · 3 of 96 slices shown]
[im 32/96  soft-tissue]
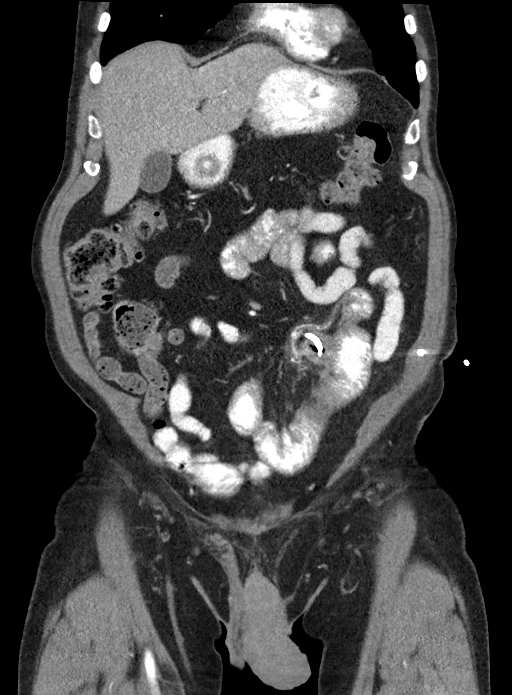
[im 43/96  soft-tissue]
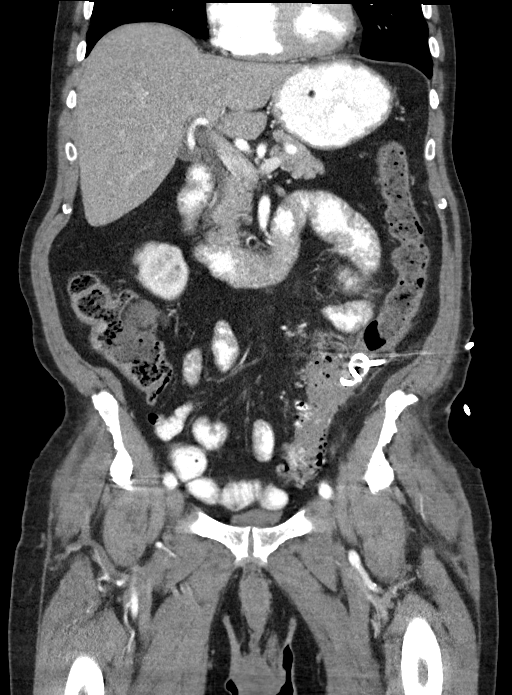
[im 53/96  soft-tissue]
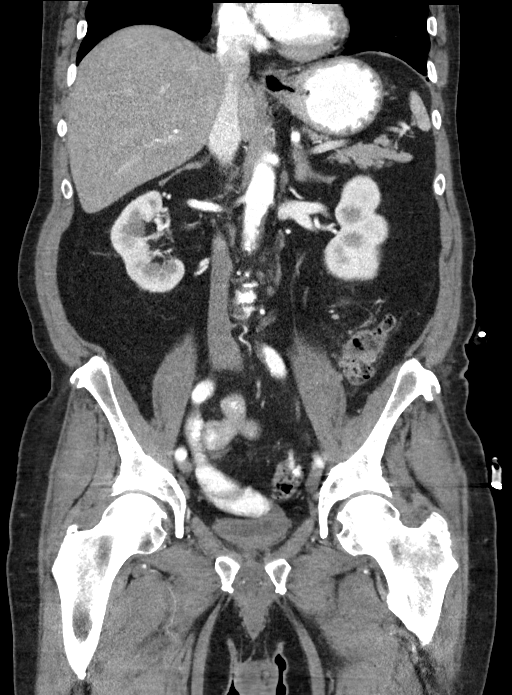

[Series 7: delay · axial · delayed · 0.73mm/px · z∈[-1109,-664]mm · 13 of 105 slices shown, 15 images]
[im 8/105  soft-tissue]
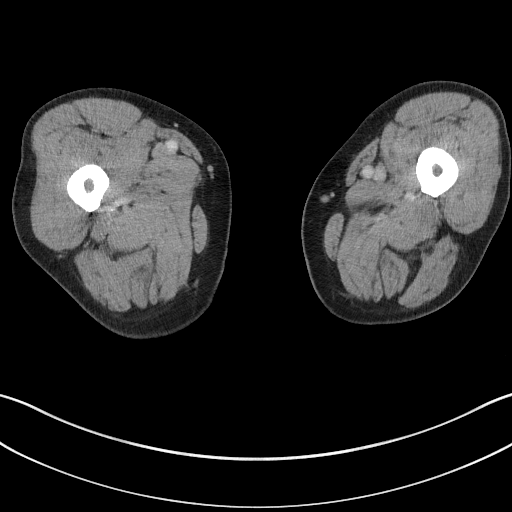
[im 8/105  bone]
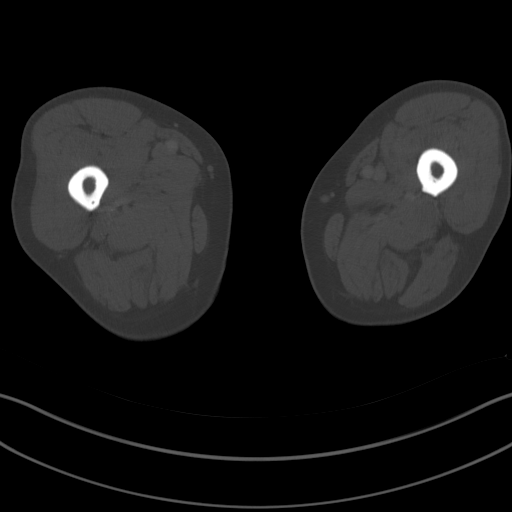
[im 15/105  soft-tissue]
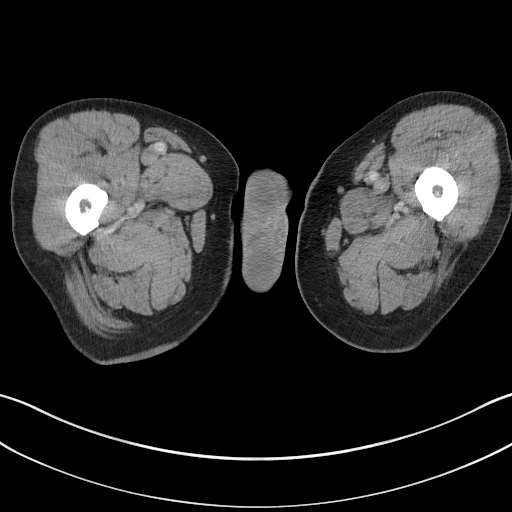
[im 23/105  soft-tissue]
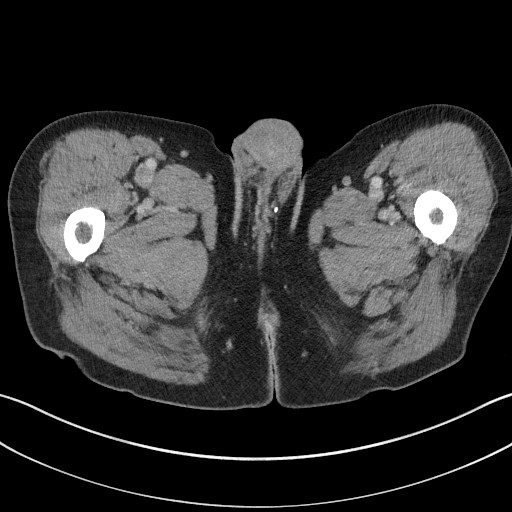
[im 30/105  soft-tissue]
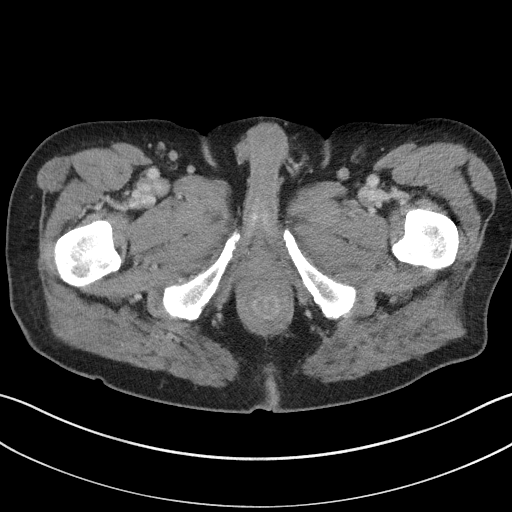
[im 38/105  soft-tissue]
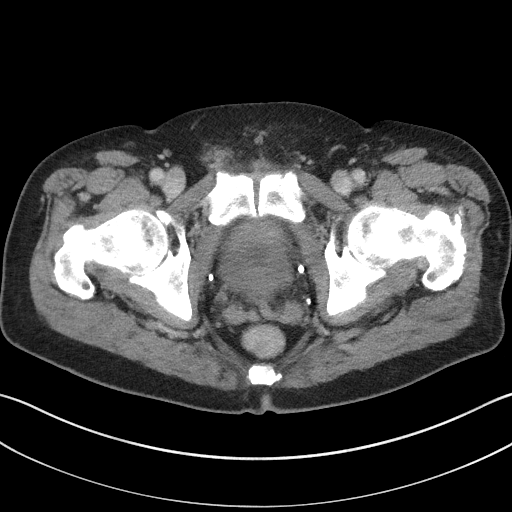
[im 45/105  soft-tissue]
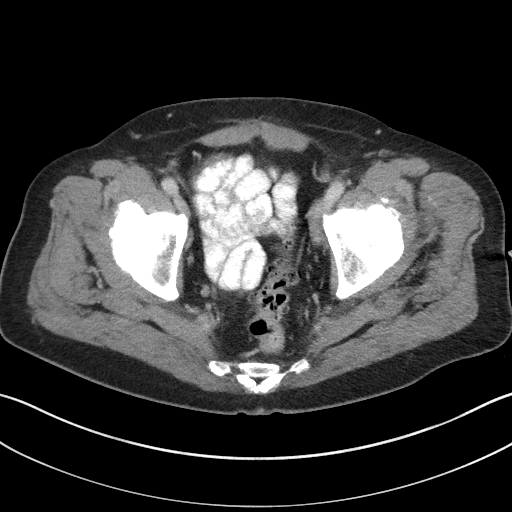
[im 53/105  soft-tissue]
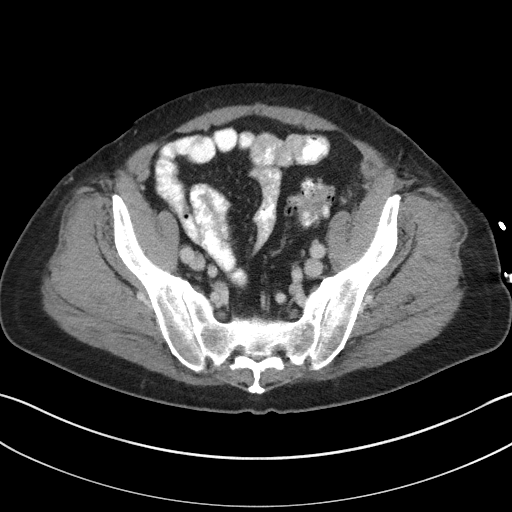
[im 60/105  soft-tissue]
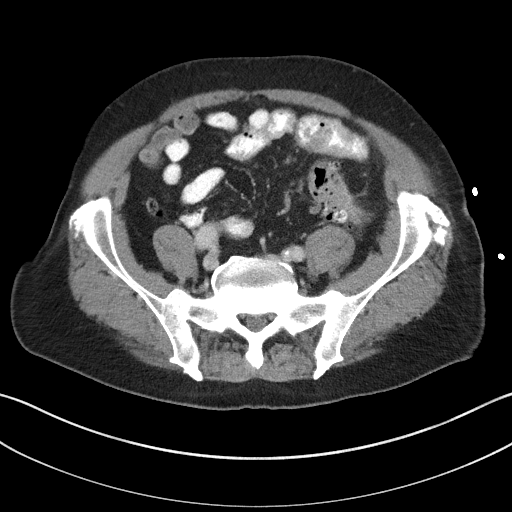
[im 67/105  soft-tissue]
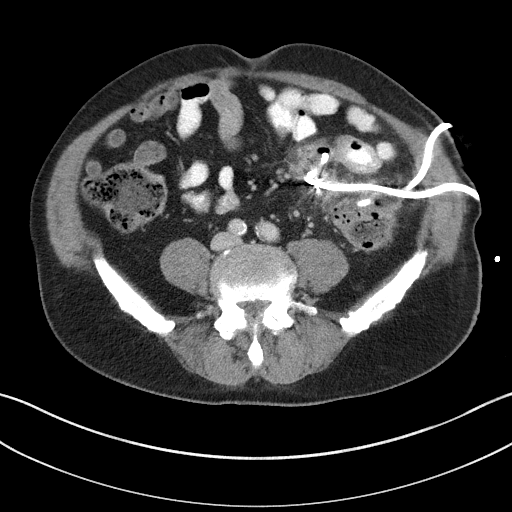
[im 67/105  bone]
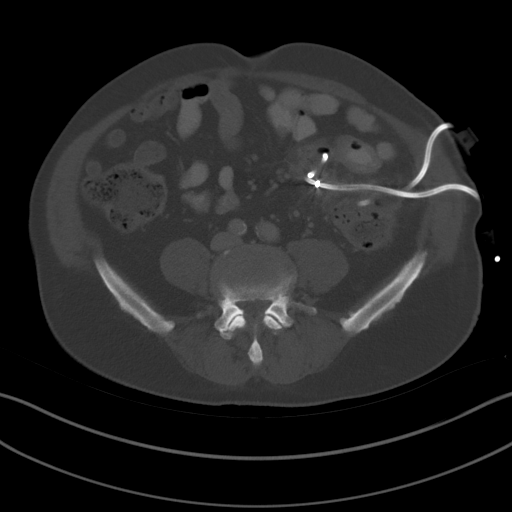
[im 75/105  soft-tissue]
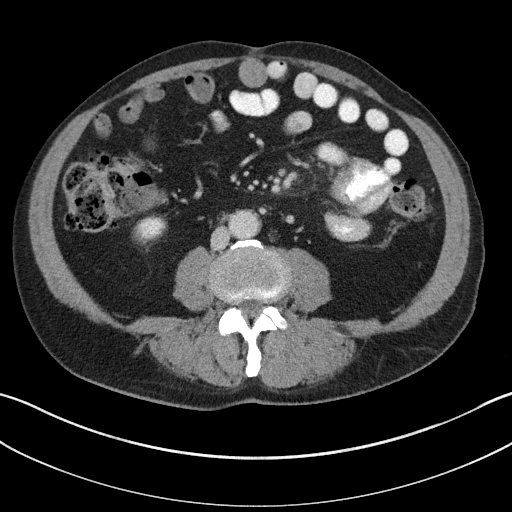
[im 82/105  soft-tissue]
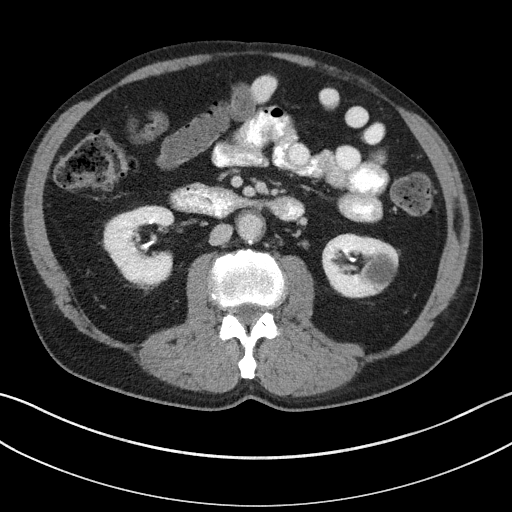
[im 90/105  soft-tissue]
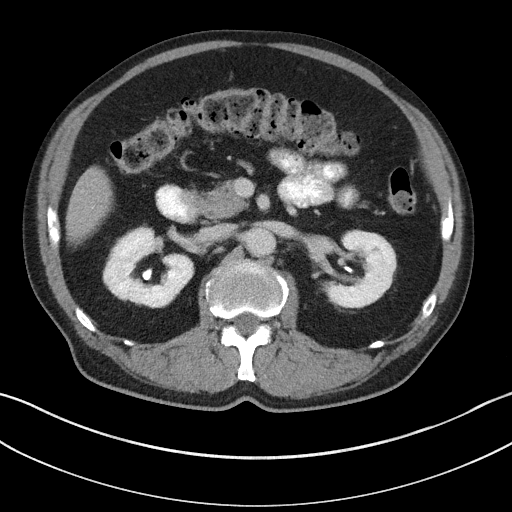
[im 97/105  soft-tissue]
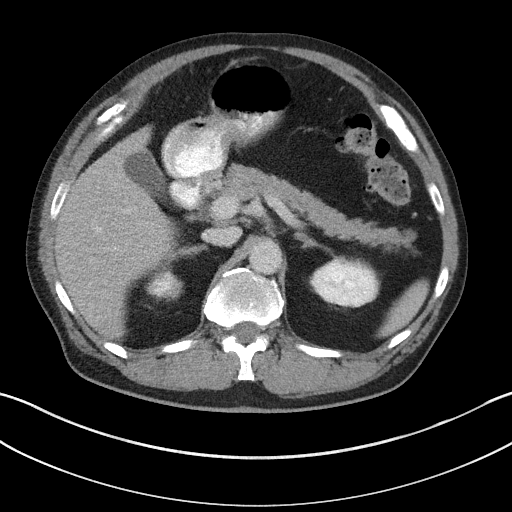

[16 of 46 positions shown; findings below may reference images not displayed]

FINDINGS: Lower chest: No acute abnormality.

Hepatobiliary: No focal liver abnormality is seen. No gallstones,
gallbladder wall thickening, or biliary dilatation.

Pancreas: Unremarkable. No pancreatic ductal dilatation or
surrounding inflammatory changes.

Spleen: Normal in size without focal abnormality.

Adrenals/Urinary Tract: Adrenal glands appear normal. Left renal
cyst is noted. Nonobstructive right renal calculus is noted. No
hydronephrosis or renal obstruction is noted. Urinary bladder is
unremarkable.

Stomach/Bowel: Stomach and appendix are unremarkable. There is no
evidence of bowel obstruction. Diverticulosis is noted throughout
the colon. There is significantly diverticulitis of sigmoid colon
with decreased inflammation involving adjacent small bowel loop.
Continued presence of 2 percutaneous drainage catheters between
these bowel loops with no significant fluid collection remaining.

Vascular/Lymphatic: Aortic atherosclerosis. No enlarged abdominal or
pelvic lymph nodes.

Reproductive: Prostate is unremarkable.

Other: No abdominal wall hernia or abnormality. No abdominopelvic
ascites.

Musculoskeletal: No acute or significant osseous findings.
IMPRESSION: Continued presence of 2 percutaneous drainage catheters in the left
side of the abdomen, with no significant residual fluid seen in the
para diverticular abscesses. There is also noted significantly
decreased inflammation involving the diverticulitis as well as the
inflammation involving the adjacent small bowel loop.

Nonobstructive right renal calculus.

Aortic Atherosclerosis (LQU5A-EPI.I).

## 2022-06-05 ENCOUNTER — Ambulatory Visit: Payer: Medicare Other

## 2022-06-07 ENCOUNTER — Encounter: Payer: Medicare Other | Admitting: *Deleted

## 2022-06-07 ENCOUNTER — Ambulatory Visit: Payer: Medicare Other

## 2022-06-07 DIAGNOSIS — Z955 Presence of coronary angioplasty implant and graft: Secondary | ICD-10-CM

## 2022-06-07 DIAGNOSIS — I213 ST elevation (STEMI) myocardial infarction of unspecified site: Secondary | ICD-10-CM

## 2022-06-07 LAB — GLUCOSE, CAPILLARY
Glucose-Capillary: 232 mg/dL — ABNORMAL HIGH (ref 70–99)
Glucose-Capillary: 130 mg/dL — ABNORMAL HIGH (ref 70–99)

## 2022-06-07 NOTE — Progress Notes (Signed)
Daily Session Note  Patient Details  Name: Todd Hansen Sr. MRN: [id number] Date of Birth: Nov 24, 1945 Referring Provider:   Flowsheet Row Cardiac Rehab from 05/17/2022 in [facility] Cardiac and Pulmonary Rehab  Referring Provider Lujean Amel MD       Encounter Date: 06/07/2022  Check In:  Session Check In - 06/07/22 0820       Check-In   Supervising physician immediately available to respond to emergencies See telemetry face sheet for immediately available ER MD    Location [facility]-Cardiac & Pulmonary Rehab    Staff Present Earlean Shawl, BS, ACSM CEP, Exercise Physiologist;Joseph Rosebud Poles, RN, [place]    Virtual Visit No    Medication changes reported     No    Fall or balance concerns reported    No    Warm-up and Cool-down Performed on first and last piece of equipment    Resistance Training Performed Yes    VAD Patient? No    PAD/SET Patient? No      Pain Assessment   Currently in Pain? No/denies                Social History   Tobacco Use  Smoking Status Former   Types: Cigarettes   Quit date: 1970   Years since quitting: 53.7   Passive exposure: Never  Smokeless Tobacco Never    Goals Met:  Independence with exercise equipment Exercise tolerated well Personal goals reviewed No report of concerns or symptoms today Strength training completed today  Goals Unmet:  Not Applicable  Comments: Pt able to follow exercise prescription today without complaint.  Will continue to monitor for progression.    Dr. Emily Filbert is Medical Director for [facility].  Dr. Ottie Glazier is Medical Director for [facility] Pulmonary Rehabilitation.

## 2022-06-08 ENCOUNTER — Ambulatory Visit: Payer: Medicare Other

## 2022-06-12 ENCOUNTER — Encounter: Payer: Medicare Other | Attending: Internal Medicine | Admitting: *Deleted

## 2022-06-12 ENCOUNTER — Ambulatory Visit: Payer: Medicare Other

## 2022-06-12 DIAGNOSIS — I252 Old myocardial infarction: Secondary | ICD-10-CM | POA: Diagnosis not present

## 2022-06-12 DIAGNOSIS — Z955 Presence of coronary angioplasty implant and graft: Secondary | ICD-10-CM | POA: Insufficient documentation

## 2022-06-12 DIAGNOSIS — Z48812 Encounter for surgical aftercare following surgery on the circulatory system: Secondary | ICD-10-CM | POA: Insufficient documentation

## 2022-06-12 DIAGNOSIS — I213 ST elevation (STEMI) myocardial infarction of unspecified site: Secondary | ICD-10-CM

## 2022-06-12 NOTE — Progress Notes (Signed)
Daily Session Note  Patient Details  Name: Todd ARCHAMBEAU Sr. MRN: [id number] Date of Birth: 04-28-1946 Referring Provider:   Flowsheet Row Cardiac Rehab from 05/17/2022 in [facility]  Referring Provider Lujean Amel MD       Encounter Date: 06/12/2022  Check In:  Session Check In - 06/12/22 0842       Check-In   Supervising physician immediately available to respond to emergencies See telemetry face sheet for immediately available ER MD    Location [facility]    Staff Present Heath Lark, RN, BSN, CCRP;Jessica [place], MA, RCEP, CCRP, Marylynn Pearson, MS, ASCM CEP, Exercise Physiologist    Virtual Visit No    Medication changes reported     No    Fall or balance concerns reported    No    Warm-up and Cool-down Performed on first and last piece of equipment    Resistance Training Performed Yes    VAD Patient? No    PAD/SET Patient? No      Pain Assessment   Currently in Pain? No/denies                Social History   Tobacco Use  Smoking Status Former   Types: Cigarettes   Quit date: 1970   Years since quitting: 53.7   Passive exposure: Never  Smokeless Tobacco Never    Goals Met:  Independence with exercise equipment Exercise tolerated well No report of concerns or symptoms today  Goals Unmet:  Not Applicable  Comments: Pt able to follow exercise prescription today without complaint.  Will continue to monitor for progression.    Dr. Emily Filbert is Medical Director for [place].  Dr. Ottie Glazier is Medical Director for [facility].

## 2022-06-13 ENCOUNTER — Encounter: Payer: Self-pay | Admitting: *Deleted

## 2022-06-13 DIAGNOSIS — Z955 Presence of coronary angioplasty implant and graft: Secondary | ICD-10-CM

## 2022-06-13 DIAGNOSIS — I213 ST elevation (STEMI) myocardial infarction of unspecified site: Secondary | ICD-10-CM

## 2022-06-13 NOTE — Progress Notes (Signed)
Cardiac Individual Treatment Plan  Patient Details  Name: Todd DERSHEM Sr. MRN: [id number] Date of Birth: Aug 27, 1946 Referring Provider:   Flowsheet Row Cardiac Rehab from 05/17/2022 in [facility] Cardiac and Pulmonary Rehab  Referring Provider Lujean Amel MD       Initial Encounter Date:  Flowsheet Row Cardiac Rehab from 05/17/2022 in [facility] Cardiac and Pulmonary Rehab  Date 05/17/22       Visit Diagnosis: ST elevation myocardial infarction (STEMI), unspecified artery [facility])  Status post coronary artery stent placement  Patient's Home Medications on Admission:  Current Outpatient Medications:    amLODipine (NORVASC) 5 MG tablet, Take 5 mg by mouth 2 (two) times daily. (Patient not taking: Reported on 05/09/2022), Disp: , Rfl:    aspirin 81 MG chewable tablet, Chew by mouth., Disp: , Rfl:    atorvastatin (LIPITOR) 80 MG tablet, Take 80 mg by mouth daily., Disp: , Rfl:    clopidogrel (PLAVIX) 75 MG tablet, Take by mouth., Disp: , Rfl:    cyanocobalamin 1000 MCG tablet, Take 1,000 mcg by mouth daily., Disp: , Rfl:    famotidine (PEPCID) 20 MG tablet, Take 1 tablet by mouth 2 (two) times daily., Disp: , Rfl:    isosorbide mononitrate (IMDUR) 60 MG 24 hr tablet, Take 60 mg by mouth daily. (Patient not taking: Reported on 05/09/2022), Disp: , Rfl:    losartan (COZAAR) 25 MG tablet, Take 25 mg by mouth daily., Disp: , Rfl:    metFORMIN (GLUCOPHAGE) 500 MG tablet, Take 500 mg by mouth daily., Disp: , Rfl:    metoprolol succinate (TOPROL-XL) 25 MG 24 hr tablet, Take 12.5 mg by mouth daily., Disp: , Rfl:    propylthiouracil (PTU) 50 MG tablet, Take 25 mg by mouth 2 (two) times daily. (Patient not taking: Reported on 05/09/2022), Disp: , Rfl: 0   Vitamin D, Ergocalciferol, (DRISDOL) 1.25 MG (50000 UNIT) CAPS capsule, Take 50,000 Units by mouth once a week. (Patient not taking: Reported on 05/09/2022), Disp: , Rfl:   Past Medical History: Past Medical History:  Diagnosis Date   Diverticulitis     Diverticulitis of large intestine with abscess without bleeding    Enlarged prostate    Gallstones    Hypertension    Hyperthyroidism     Tobacco Use: Social History   Tobacco Use  Smoking Status Former   Types: Cigarettes   Quit date: 1970   Years since quitting: 53.7   Passive exposure: Never  Smokeless Tobacco Never    Labs: Review Flowsheet       Latest Ref Rng & Units 12/23/2015 08/31/2021  Labs for ITP Cardiac and Pulmonary Rehab  Cholestrol 0 - 200 mg/dL 209  -  LDL (calc) 0 - 99 mg/dL 141  -  HDL-C >40 mg/dL 40  -  Trlycerides <150 mg/dL 142  -  Hemoglobin A1c 4.8 - 5.6 % - 8.5      Exercise Target Goals: Exercise Program Goal: Individual exercise prescription set using results from initial 6 min walk test and THRR while considering  patient's activity barriers and safety.   Exercise Prescription Goal: Initial exercise prescription builds to 30-45 minutes a day of aerobic activity, 2-3 days per week.  Home exercise guidelines will be given to patient during program as part of exercise prescription that the participant will acknowledge.   Education: Aerobic Exercise: - Group verbal and visual presentation on the components of exercise prescription. Introduces F.I.T.T principle from ACSM for exercise prescriptions.  Reviews F.I.T.T. principles of aerobic  exercise including progression. Written material given at graduation.   Education: Resistance Exercise: - Group verbal and visual presentation on the components of exercise prescription. Introduces F.I.T.T principle from ACSM for exercise prescriptions  Reviews F.I.T.T. principles of resistance exercise including progression. Written material given at graduation.    Education: Exercise & Equipment Safety: - Individual verbal instruction and demonstration of equipment use and safety with use of the equipment. Flowsheet Row Cardiac Rehab from 06/07/2022 in [facility] Cardiac and Pulmonary Rehab  Date 05/17/22   Educator [facility]  Instruction Review Code 1- Verbalizes Understanding       Education: Exercise Physiology & General Exercise Guidelines: - Group verbal and written instruction with models to review the exercise physiology of the cardiovascular system and associated critical values. Provides general exercise guidelines with specific guidelines to those with heart or lung disease.    Education: Flexibility, Balance, Mind/Body Relaxation: - Group verbal and visual presentation with interactive activity on the components of exercise prescription. Introduces F.I.T.T principle from ACSM for exercise prescriptions. Reviews F.I.T.T. principles of flexibility and balance exercise training including progression. Also discusses the mind body connection.  Reviews various relaxation techniques to help reduce and manage stress (i.e. Deep breathing, progressive muscle relaxation, and visualization). Balance handout provided to take home. Written material given at graduation.   Activity Barriers & Risk Stratification:  Activity Barriers & Cardiac Risk Stratification - 05/17/22 1136       Activity Barriers & Cardiac Risk Stratification   Activity Barriers Left Knee Replacement;Deconditioning;Balance Concerns;Joint Problems   partial knee, left shoulder surgery previously   Cardiac Risk Stratification High             6 Minute Walk:  6 Minute Walk     Row Name 05/17/22 1135         6 Minute Walk   Phase Initial     Distance 1679 feet     Walk Time 6 minutes     # of Rest Breaks 0     MPH 3.18     METS 3.28     RPE 9     VO2 Peak 11.46     Symptoms No     Resting HR 80 bpm     Resting BP 124/58     Resting Oxygen Saturation  97 %     Exercise Oxygen Saturation  during 6 min walk 94 %     Max Ex. HR 100 bpm     Max Ex. BP 136/74     2 Minute Post BP 122/64              Oxygen Initial Assessment:   Oxygen Re-Evaluation:   Oxygen Discharge (Final Oxygen  Re-Evaluation):   Initial Exercise Prescription:  Initial Exercise Prescription - 05/17/22 1100       Date of Initial Exercise RX and Referring Provider   Date 05/17/22    Referring Provider Lujean Amel MD      Treadmill   MPH 3    Grade 0.5    Minutes 15    METs 3.5      Recumbant Bike   Level 2    RPM 50    Watts 30    Minutes 15    METs 3      NuStep   Level 3    SPM 80    Minutes 15    METs 3      REL-XR   Level 1    Speed 50  Minutes 15    METs 3      Track   Laps 43    Minutes 15    METs 3.34      Prescription Details   Frequency (times per week) 3    Duration Progress to 30 minutes of continuous aerobic without signs/symptoms of physical distress      Intensity   THRR 40-80% of Max Heartrate 105-130    Ratings of Perceived Exertion 11-13    Perceived Dyspnea 0-4      Progression   Progression Continue to progress workloads to maintain intensity without signs/symptoms of physical distress.      Resistance Training   Training Prescription Yes    Weight 5 lb    Reps 10-15             Perform Capillary Blood Glucose checks as needed.  Exercise Prescription Changes:   Exercise Prescription Changes     Row Name 05/17/22 1100 06/12/22 1400           Response to Exercise   Blood Pressure (Admit) 124/58 110/64      Blood Pressure (Exercise) 136/74 144/72      Blood Pressure (Exit) 122/64 124/68      Heart Rate (Admit) 80 bpm 80 bpm      Heart Rate (Exercise) 100 bpm 123 bpm      Heart Rate (Exit) 83 bpm 103 bpm      Oxygen Saturation (Admit) 97 % --      Oxygen Saturation (Exercise) 94 % --      Rating of Perceived Exertion (Exercise) 9 11      Symptoms none none      Comments walk test results First two days of exercise      Duration -- Continue with 30 min of aerobic exercise without signs/symptoms of physical distress.      Intensity -- THRR unchanged        Progression   Progression -- Continue to progress workloads  to maintain intensity without signs/symptoms of physical distress.      Average METs -- 3.19        Resistance Training   Training Prescription -- Yes      Weight -- 5 lb      Reps -- 10-15        Interval Training   Interval Training -- No        Treadmill   MPH -- 2.6      Grade -- 0.5      Minutes -- 15      METs -- 3.17        Recumbant Bike   Level -- 2      Watts -- 22      Minutes -- 15      METs -- 3.17        Oxygen   Maintain Oxygen Saturation -- 88% or higher               Exercise Comments:   Exercise Comments     Row Name 05/29/22 0805           Exercise Comments First full day of exercise!  Patient was oriented to gym and equipment including functions, settings, policies, and procedures.  Patient's individual exercise prescription and treatment plan were reviewed.  All starting workloads were established based on the results of the 6 minute walk test done at initial orientation visit.  The plan for exercise progression was also introduced and  progression will be customized based on patient's performance and goals.                Exercise Goals and Review:   Exercise Goals     Row Name 05/17/22 1143             Exercise Goals   Increase Physical Activity Yes       Intervention Provide advice, education, support and counseling about physical activity/exercise needs.;Develop an individualized exercise prescription for aerobic and resistive training based on initial evaluation findings, risk stratification, comorbidities and participant's personal goals.       Expected Outcomes Short Term: Attend rehab on a regular basis to increase amount of physical activity.;Long Term: Add in home exercise to make exercise part of routine and to increase amount of physical activity.;Long Term: Exercising regularly at least 3-5 days a week.       Increase Strength and Stamina Yes       Intervention Provide advice, education, support and counseling about  physical activity/exercise needs.;Develop an individualized exercise prescription for aerobic and resistive training based on initial evaluation findings, risk stratification, comorbidities and participant's personal goals.       Expected Outcomes Short Term: Increase workloads from initial exercise prescription for resistance, speed, and METs.;Short Term: Perform resistance training exercises routinely during rehab and add in resistance training at home;Long Term: Improve cardiorespiratory fitness, muscular endurance and strength as measured by increased METs and functional capacity (6MWT)       Able to understand and use rate of perceived exertion (RPE) scale Yes       Intervention Provide education and explanation on how to use RPE scale       Expected Outcomes Long Term:  Able to use RPE to guide intensity level when exercising independently;Short Term: Able to use RPE daily in rehab to express subjective intensity level       Able to understand and use Dyspnea scale Yes       Intervention Provide education and explanation on how to use Dyspnea scale       Expected Outcomes Short Term: Able to use Dyspnea scale daily in rehab to express subjective sense of shortness of breath during exertion;Long Term: Able to use Dyspnea scale to guide intensity level when exercising independently       Knowledge and understanding of Target Heart Rate Range (THRR) Yes       Intervention Provide education and explanation of THRR including how the numbers were predicted and where they are located for reference       Expected Outcomes Short Term: Able to state/look up THRR;Short Term: Able to use daily as guideline for intensity in rehab;Long Term: Able to use THRR to govern intensity when exercising independently       Able to check pulse independently Yes       Intervention Provide education and demonstration on how to check pulse in carotid and radial arteries.;Review the importance of being able to check your own  pulse for safety during independent exercise       Expected Outcomes Short Term: Able to explain why pulse checking is important during independent exercise;Long Term: Able to check pulse independently and accurately       Understanding of Exercise Prescription Yes       Intervention Provide education, explanation, and written materials on patient's individual exercise prescription       Expected Outcomes Short Term: Able to explain program exercise prescription;Long Term: Able to explain home  exercise prescription to exercise independently                Exercise Goals Re-Evaluation :  Exercise Goals Re-Evaluation     Row Name 05/29/22 0805 06/12/22 1431           Exercise Goal Re-Evaluation   Exercise Goals Review Understanding of Exercise Prescription;Knowledge and understanding of Target Heart Rate Range (THRR);Able to understand and use rate of perceived exertion (RPE) scale;Able to understand and use Dyspnea scale Understanding of Exercise Prescription;Increase Physical Activity;Increase Strength and Stamina      Comments Reviewed RPE and dyspnea scales, THR and program prescription with pt today.  Pt voiced understanding and was given a copy of goals to take home. Page is off to a good start in rehab. He had an overall average MET level of 3.19 METs. He also did well on the treadmill at a speed of 2.6 mph and an incline of 0.5%. He tolerated using 5 lb hand weights for resistance training as well. We will continue to monitor his progress in the program.      Expected Outcomes Short: Use RPE daily to regulate intensity. Long: Follow program prescription in THR. Short: Continue to follow exercise prescription and increase workloads. Long: Continue to increase strength and stamina.               Discharge Exercise Prescription (Final Exercise Prescription Changes):  Exercise Prescription Changes - 06/12/22 1400       Response to Exercise   Blood Pressure (Admit) 110/64     Blood Pressure (Exercise) 144/72    Blood Pressure (Exit) 124/68    Heart Rate (Admit) 80 bpm    Heart Rate (Exercise) 123 bpm    Heart Rate (Exit) 103 bpm    Rating of Perceived Exertion (Exercise) 11    Symptoms none    Comments First two days of exercise    Duration Continue with 30 min of aerobic exercise without signs/symptoms of physical distress.    Intensity THRR unchanged      Progression   Progression Continue to progress workloads to maintain intensity without signs/symptoms of physical distress.    Average METs 3.19      Resistance Training   Training Prescription Yes    Weight 5 lb    Reps 10-15      Interval Training   Interval Training No      Treadmill   MPH 2.6    Grade 0.5    Minutes 15    METs 3.17      Recumbant Bike   Level 2    Watts 22    Minutes 15    METs 3.17      Oxygen   Maintain Oxygen Saturation 88% or higher             Nutrition:  Target Goals: Understanding of nutrition guidelines, daily intake of sodium <1570m, cholesterol <2092m calories 30% from fat and 7% or less from saturated fats, daily to have 5 or more servings of fruits and vegetables.  Education: All About Nutrition: -Group instruction provided by verbal, written material, interactive activities, discussions, models, and posters to present general guidelines for heart healthy nutrition including fat, fiber, MyPlate, the role of sodium in heart healthy nutrition, utilization of the nutrition label, and utilization of this knowledge for meal planning. Follow up email sent as well. Written material given at graduation. Flow[facility]  Education need  identified 05/17/22  Date 06/07/22  Educator [place]  Instruction Review Code 1- Verbalizes Understanding       Biometrics:  Pre Biometrics - 05/17/22 1144       Pre Biometrics   Height 5' 8.25" (1.734 m)    Weight 171 lb 11.2 oz (77.9 kg)    Waist  Circumference 35 inches    Hip Circumference 39.5 inches    Waist to Hip Ratio 0.89 %    BMI (Calculated) 25.9    Single Leg Stand 5.8 seconds              Nutrition Therapy Plan and Nutrition Goals:  Nutrition Therapy & Goals - 05/17/22 1145       Intervention Plan   Intervention Prescribe, educate and counsel regarding individualized specific dietary modifications aiming towards targeted core components such as weight, hypertension, lipid management, diabetes, heart failure and other comorbidities.    Expected Outcomes Short Term Goal: Understand basic principles of dietary content, such as calories, fat, sodium, cholesterol and nutrients.;Short Term Goal: A plan has been developed with personal nutrition goals set during dietitian appointment.;Long Term Goal: Adherence to prescribed nutrition plan.             Nutrition Assessments:  MEDIFICTS Score Key: ?70 Need to make dietary changes  40-70 Heart Healthy Diet ? 40 Therapeutic Level Cholesterol Diet  Flowsheet Row Cardiac Rehab from 05/17/2022 in [facility] Cardiac and Pulmonary Rehab  Picture Your Plate Total Score on Admission 53      Picture Your Plate Scores: <37 Unhealthy dietary pattern with much room for improvement. 41-50 Dietary pattern unlikely to meet recommendations for good health and room for improvement. 51-60 More healthful dietary pattern, with some room for improvement.  >60 Healthy dietary pattern, although there may be some specific behaviors that could be improved.    Nutrition Goals Re-Evaluation:  Nutrition Goals Re-Evaluation     [place] Name 06/07/22 0821             Goals   Current Weight 169 lb (76.7 kg)       Nutrition Goal Eat smaller portions.       Comment Todd Hansen has made good changes in his diet which consist of eating smaller portions more frequently. He is watching his blood sugars at home. He is not eating any fried foods and has switch to grilled chicken.       Expected Outcome  Short: continue to eat smaller portions. Long: maintain a diet that adheres to him.                Nutrition Goals Discharge (Final Nutrition Goals Re-Evaluation):  Nutrition Goals Re-Evaluation - 06/07/22 9024       Goals   Current Weight 169 lb (76.7 kg)    Nutrition Goal Eat smaller portions.    Comment Todd Hansen has made good changes in his diet which consist of eating smaller portions more frequently. He is watching his blood sugars at home. He is not eating any fried foods and has switch to grilled chicken.    Expected Outcome Short: continue to eat smaller portions. Long: maintain a diet that adheres to him.             Psychosocial: Target Goals: Acknowledge presence or absence of significant depression and/or stress, maximize coping skills, provide positive support system. Participant is able to verbalize types and ability to use techniques and skills needed for reducing stress and depression.   Education: Stress, Anxiety, and Depression -  Group verbal and visual presentation to define topics covered.  Reviews how body is impacted by stress, anxiety, and depression.  Also discusses healthy ways to reduce stress and to treat/manage anxiety and depression.  Written material given at graduation.   Education: Sleep Hygiene -Provides group verbal and written instruction about how sleep can affect your health.  Define sleep hygiene, discuss sleep cycles and impact of sleep habits. Review good sleep hygiene tips.    Initial Review & Psychosocial Screening:  Initial Psych Review & Screening - 05/09/22 1317       Initial Review   Current issues with None Identified      Family Dynamics   Good Support System? Yes   wife, daughter, son  and dog     Barriers   Psychosocial barriers to participate in program There are no identifiable barriers or psychosocial needs.      Screening Interventions   Interventions Encouraged to exercise    Expected Outcomes Short Term goal:  Utilizing psychosocial counselor, staff and physician to assist with identification of specific Stressors or current issues interfering with healing process. Setting desired goal for each stressor or current issue identified.;Long Term Goal: Stressors or current issues are controlled or eliminated.;Short Term goal: Identification and review with participant of any Quality of Life or Depression concerns found by scoring the questionnaire.;Long Term goal: The participant improves quality of Life and PHQ9 Scores as seen by post scores and/or verbalization of changes             Quality of Life Scores:   Quality of Life - 05/17/22 1145       Quality of Life   Select Quality of Life      Quality of Life Scores   Health/Function Pre 27.4 %    Socioeconomic Pre 28.5 %    Psych/Spiritual Pre 30 %    Family Pre 28.8 %    GLOBAL Pre 28.37 %            Scores of 19 and below usually indicate a poorer quality of life in these areas.  A difference of  2-3 points is a clinically meaningful difference.  A difference of 2-3 points in the total score of the Quality of Life Index has been associated with significant improvement in overall quality of life, self-image, physical symptoms, and general health in studies assessing change in quality of life.  PHQ-9: Review Flowsheet       05/17/2022  Depression screen PHQ 2/9  Decreased Interest 0  Down, Depressed, Hopeless 0  PHQ - 2 Score 0  Altered sleeping 0  Tired, decreased energy 0  Change in appetite 0  Feeling bad or failure about yourself  0  Trouble concentrating 0  Moving slowly or fidgety/restless 0  Suicidal thoughts 0  PHQ-9 Score 0  Difficult doing work/chores Not difficult at all   Interpretation of Total Score  Total Score Depression Severity:  1-4 = Minimal depression, 5-9 = Mild depression, 10-14 = Moderate depression, 15-19 = Moderately severe depression, 20-27 = Severe depression   Psychosocial Evaluation and  Intervention:  Psychosocial Evaluation - 05/09/22 1330       Psychosocial Evaluation & Interventions   Interventions Encouraged to exercise with the program and follow exercise prescription    Comments Todd Hansen has no barriers to attending the program.  He is ready to attend.Marland Kitchen He lives with his wife and he has a dog at home. His support is his wife and children and his "  Lilia Pro" (dog). He has been active up to hisds heart attack and plans to return to his golf and bowling.   He is hard of hearing and the hearing aids he received from the [place] don't work well.  He plans on getting them checked at the [place].    Expected Outcomes STG Todd Hansen attends all scheduled sessions, he is able to return to his daily activities. LTG Todd Hansen continues to progress with his exercise to enjoy his activities    Continue Psychosocial Services  Follow up required by staff             Psychosocial Re-Evaluation:  Psychosocial Re-Evaluation     [place] Name 06/07/22 [phone]             Psychosocial Re-Evaluation   Current issues with None Identified       Comments Patient reports no issues with their current mental states, sleep, stress, depression or anxiety. Will follow up with patient in a few weeks for any changes.       Expected Outcomes Short: Continue to exercise regularly to support mental health and notify staff of any changes. Long: maintain mental health and well being through teaching of rehab or prescribed medications independently.       Interventions Encouraged to attend Cardiac Rehabilitation for the exercise       Continue Psychosocial Services  Follow up required by staff                Psychosocial Discharge (Final Psychosocial Re-Evaluation):  Psychosocial Re-Evaluation - 06/07/22 0824       Psychosocial Re-Evaluation   Current issues with None Identified    Comments Patient reports no issues with their current mental states, sleep, stress, depression or anxiety. Will follow up with patient in a  few weeks for any changes.    Expected Outcomes Short: Continue to exercise regularly to support mental health and notify staff of any changes. Long: maintain mental health and well being through teaching of rehab or prescribed medications independently.    Interventions Encouraged to attend Cardiac Rehabilitation for the exercise    Continue Psychosocial Services  Follow up required by staff             Vocational Rehabilitation: Provide vocational rehab assistance to qualifying candidates.   Vocational Rehab Evaluation & Intervention:   Education: Education Goals: Education classes will be provided on a variety of topics geared toward better understanding of heart health and risk factor modification. Participant will state understanding/return demonstration of topics presented as noted by education test scores.  Learning Barriers/Preferences:   General Cardiac Education Topics:  AED/CPR: - Group verbal and written instruction with the use of models to demonstrate the basic use of the AED with the basic ABC's of resuscitation.   Anatomy and Cardiac Procedures: - Group verbal and visual presentation and models provide information about basic cardiac anatomy and function. Reviews the testing methods done to diagnose heart disease and the outcomes of the test results. Describes the treatment choices: Medical Management, Angioplasty, or Coronary Bypass Surgery for treating various heart conditions including Myocardial Infarction, Angina, Valve Disease, and Cardiac Arrhythmias.  Written material given at graduation.   Medication Safety: - Group verbal and visual instruction to review commonly prescribed medications for heart and lung disease. Reviews the medication, class of the drug, and side effects. Includes the steps to properly store meds and maintain the prescription regimen.  Written material given at graduation.   Intimacy: - Group verbal instruction through  game format to  discuss how heart and lung disease can affect sexual intimacy. Written material given at graduation..   Know Your Numbers and Heart Failure: - Group verbal and visual instruction to discuss disease risk factors for cardiac and pulmonary disease and treatment options.  Reviews associated critical values for Overweight/Obesity, Hypertension, Cholesterol, and Diabetes.  Discusses basics of heart failure: signs/symptoms and treatments.  Introduces Heart Failure Zone chart for action plan for heart failure.  Written material given at graduation. Flowsheet Row Cardiac Rehab from 06/07/2022 in [facility] Cardiac and Pulmonary Rehab  Education need identified 05/17/22       Infection Prevention: - Provides verbal and written material to individual with discussion of infection control including proper hand washing and proper equipment cleaning during exercise session. Flowsheet Row Cardiac Rehab from 06/07/2022 in [facility] Cardiac and Pulmonary Rehab  Date 05/17/22  Educator [facility]  Instruction Review Code 1- Verbalizes Understanding       Falls Prevention: - Provides verbal and written material to individual with discussion of falls prevention and safety. Flowsheet Row Cardiac Rehab from 06/07/2022 in [facility] Cardiac and Pulmonary Rehab  Date 05/09/22  Educator SB  Instruction Review Code 1- Verbalizes Understanding       Other: -Provides group and verbal instruction on various topics (see comments)   Knowledge Questionnaire Score:  Knowledge Questionnaire Score - 05/17/22 1145       Knowledge Questionnaire Score   Pre Score 24/26             Core Components/Risk Factors/Patient Goals at Admission:  Personal Goals and Risk Factors at Admission - 05/17/22 1145       Core Components/Risk Factors/Patient Goals on Admission    Weight Management Yes;Weight Loss    Intervention Weight Management: Provide education and appropriate resources to help participant work on and attain dietary  goals.;Weight Management: Develop a combined nutrition and exercise program designed to reach desired caloric intake, while maintaining appropriate intake of nutrient and fiber, sodium and fats, and appropriate energy expenditure required for the weight goal.;Weight Management/Obesity: Establish reasonable short term and long term weight goals.    Admit Weight 171 lb 11.2 oz (77.9 kg)    Goal Weight: Short Term 165 lb (74.8 kg)    Goal Weight: Long Term 165 lb (74.8 kg)    Expected Outcomes Short Term: Continue to assess and modify interventions until short term weight is achieved;Long Term: Adherence to nutrition and physical activity/exercise program aimed toward attainment of established weight goal;Weight Maintenance: Understanding of the daily nutrition guidelines, which includes 25-35% calories from fat, 7% or less cal from saturated fats, less than 258m cholesterol, less than 1.5gm of sodium, & 5 or more servings of fruits and vegetables daily;Weight Loss: Understanding of general recommendations for a balanced deficit meal plan, which promotes 1-2 lb weight loss per week and includes a negative energy balance of [phone] kcal/d;Understanding recommendations for meals to include 15-35% energy as protein, 25-35% energy from fat, 35-60% energy from carbohydrates, less than 2065mof dietary cholesterol, 20-35 gm of total fiber daily;Understanding of distribution of calorie intake throughout the day with the consumption of 4-5 meals/snacks    Diabetes Yes    Intervention Provide education about signs/symptoms and action to take for hypo/hyperglycemia.;Provide education about proper nutrition, including hydration, and aerobic/resistive exercise prescription along with prescribed medications to achieve blood glucose in normal ranges: Fasting glucose 65-99 mg/dL    Expected Outcomes Short Term: Participant verbalizes understanding of the signs/symptoms and immediate care of  hyper/hypoglycemia, proper foot  care and importance of medication, aerobic/resistive exercise and nutrition plan for blood glucose control.;Long Term: Attainment of HbA1C < 7%.    Hypertension Yes    Intervention Provide education on lifestyle modifcations including regular physical activity/exercise, weight management, moderate sodium restriction and increased consumption of fresh fruit, vegetables, and low fat dairy, alcohol moderation, and smoking cessation.;Monitor prescription use compliance.    Expected Outcomes Short Term: Continued assessment and intervention until BP is < 140/71m HG in hypertensive participants. < 130/818mHG in hypertensive participants with diabetes, heart failure or chronic kidney disease.;Long Term: Maintenance of blood pressure at goal levels.    Lipids Yes    Intervention Provide education and support for participant on nutrition & aerobic/resistive exercise along with prescribed medications to achieve LDL <70105mHDL >74m22m  Expected Outcomes Short Term: Participant states understanding of desired cholesterol values and is compliant with medications prescribed. Participant is following exercise prescription and nutrition guidelines.;Long Term: Cholesterol controlled with medications as prescribed, with individualized exercise RX and with personalized nutrition plan. Value goals: LDL < 70mg65mL > 40 mg.             Education:Diabetes - Individual verbal and written instruction to review signs/symptoms of diabetes, desired ranges of glucose level fasting, after meals and with exercise. Acknowledge that pre and post exercise glucose checks will be done for 3 sessions at entry of program. [facility]  Date 05/17/22  [facility]ator [facility] 1- Verbalizes Understanding       Core Components/Risk Factors/Patient Goals Review:   Goals and Risk Factor Review     Row Name 06/07/22 0824             Core  Components/Risk Factors/Patient Goals Review   Personal Goals Review Diabetes       Review Montee has been doing well with checking his blood sugar at home. He states that his sugar runs anywhere from 100 to 120 at home. Informed him that those are good numbers. He was concerned with his sugar being high this morning before exercise. Explained to patient that he is going to be a little higher after he eats something and is good to have food brefore he exercises to keep his blood sugar up. Patient Verbalizes understanding.       Expected Outcomes Short: continue to check blood sugar at home, Long: maintain blood sugars levels independently.                Core Components/Risk Factors/Patient Goals at Discharge (Final Review):   Goals and Risk Factor Review - 06/07/22 0824       Core Components/Risk Factors/Patient Goals Review   Personal Goals Review Diabetes    Review Luismiguel has been doing well with checking his blood sugar at home. He states that his sugar runs anywhere from 100 to 120 at home. Informed him that those are good numbers. He was concerned with his sugar being high this morning before exercise. Explained to patient that he is going to be a little higher after he eats something and is good to have food brefore he exercises to keep his blood sugar up. Patient Verbalizes understanding.    Expected Outcomes Short: continue to check blood sugar at home, Long: maintain blood sugars levels independently.             ITP Comments:  ITP Comments     Row Name  05/09/22 1329 05/17/22 1135 05/29/22 0805 06/13/22 0654     ITP Comments Virtual orientation call completed today. he has an appointment on Date: 05/17/2022  for EP eval and gym Orientation.  Documentation of diagnosis can be found in [facility]  Date: 04/21/2022 . Completed 6MWT and gym orientation. Initial ITP created and sent for review to Dr. Emily Filbert, Medical Director. First full day of exercise!  Patient was oriented to gym and  equipment including functions, settings, policies, and procedures.  Patient's individual exercise prescription and treatment plan were reviewed.  All starting workloads were established based on the results of the 6 minute walk test done at initial orientation visit.  The plan for exercise progression was also introduced and progression will be customized based on patient's performance and goals. 30 Day review completed. Medical Director ITP review done, changes made as directed, and signed approval by Medical Director.   New to program             Comments:

## 2022-06-14 ENCOUNTER — Ambulatory Visit: Payer: Medicare Other

## 2022-06-14 ENCOUNTER — Encounter: Payer: Medicare Other | Admitting: *Deleted

## 2022-06-14 DIAGNOSIS — I252 Old myocardial infarction: Secondary | ICD-10-CM | POA: Diagnosis not present

## 2022-06-14 DIAGNOSIS — I213 ST elevation (STEMI) myocardial infarction of unspecified site: Secondary | ICD-10-CM

## 2022-06-14 NOTE — Progress Notes (Signed)
Daily Session Note  Patient Details  Name: Todd GEAR Sr. MRN: [id number] Date of Birth: April 18, 1946 Referring Provider:   Flowsheet Row Cardiac Rehab from 05/17/2022 in [facility] Cardiac and Pulmonary Rehab  Referring Provider Lujean Amel MD       Encounter Date: 06/14/2022  Check In:  Session Check In - 06/14/22 0828       Check-In   Supervising physician immediately available to respond to emergencies See telemetry face sheet for immediately available ER MD    Location [facility]-Cardiac & Pulmonary Rehab    Staff Present Hope Budds, RDN, Shireen Quan, RN, Terie Purser, RCP,RRT,BSRT    Virtual Visit No    Medication changes reported     No    Fall or balance concerns reported    No    Warm-up and Cool-down Performed on first and last piece of equipment    Resistance Training Performed Yes    VAD Patient? No    PAD/SET Patient? No      Pain Assessment   Currently in Pain? No/denies                Social History   Tobacco Use  Smoking Status Former   Types: Cigarettes   Quit date: 1970   Years since quitting: 53.7   Passive exposure: Never  Smokeless Tobacco Never    Goals Met:  Independence with exercise equipment Exercise tolerated well No report of concerns or symptoms today Strength training completed today  Goals Unmet:  Not Applicable  Comments: Pt able to follow exercise prescription today without complaint.  Will continue to monitor for progression.    Dr. Emily Filbert is Medical Director for [employer].  Dr. Ottie Glazier is Medical Director for [facility] Pulmonary Rehabilitation.

## 2022-06-15 ENCOUNTER — Ambulatory Visit: Payer: Medicare Other

## 2022-06-19 ENCOUNTER — Ambulatory Visit: Payer: Medicare Other

## 2022-06-21 ENCOUNTER — Ambulatory Visit: Payer: Medicare Other

## 2022-06-21 ENCOUNTER — Encounter: Payer: Medicare Other | Admitting: *Deleted

## 2022-06-21 DIAGNOSIS — I252 Old myocardial infarction: Secondary | ICD-10-CM | POA: Diagnosis not present

## 2022-06-21 DIAGNOSIS — I213 ST elevation (STEMI) myocardial infarction of unspecified site: Secondary | ICD-10-CM

## 2022-06-21 NOTE — Progress Notes (Signed)
Daily Session Note  Patient Details  Name: ABDALRAHMAN CLEMENTSON Sr. MRN: [id number] Date of Birth: 01-Feb-1946 Referring Provider:   Flowsheet Row Cardiac Rehab from 05/17/2022 in [facility] Cardiac and Pulmonary Rehab  Referring Provider Lujean Amel MD       Encounter Date: 06/21/2022  Check In:  Session Check In - 06/21/22 0808       Check-In   Supervising physician immediately available to respond to emergencies See telemetry face sheet for immediately available ER MD    Location [facility]-Cardiac & Pulmonary Rehab    Staff Present Alberteen Sam, MA, RCEP, CCRP, CCET;Joseph [place], Marianne, RN, [place]    Virtual Visit No    Medication changes reported     No    Fall or balance concerns reported    No    Warm-up and Cool-down Performed on first and last piece of equipment    Resistance Training Performed Yes    VAD Patient? No    PAD/SET Patient? No      Pain Assessment   Currently in Pain? No/denies                Social History   Tobacco Use  Smoking Status Former   Types: Cigarettes   Quit date: 1970   Years since quitting: 53.8   Passive exposure: Never  Smokeless Tobacco Never    Goals Met:  Independence with exercise equipment Exercise tolerated well No report of concerns or symptoms today Strength training completed today  Goals Unmet:  Not Applicable  Comments: Pt able to follow exercise prescription today without complaint.  Will continue to monitor for progression.    Dr. Emily Filbert is Medical Director for [place].  Dr. Ottie Glazier is Medical Director for [facility] Pulmonary Rehabilitation.

## 2022-06-22 ENCOUNTER — Ambulatory Visit: Payer: Medicare Other

## 2022-06-26 ENCOUNTER — Ambulatory Visit: Payer: Medicare Other

## 2022-06-28 ENCOUNTER — Ambulatory Visit: Payer: Medicare Other

## 2022-06-28 ENCOUNTER — Encounter: Payer: Medicare Other | Admitting: *Deleted

## 2022-06-28 DIAGNOSIS — I213 ST elevation (STEMI) myocardial infarction of unspecified site: Secondary | ICD-10-CM

## 2022-06-28 DIAGNOSIS — Z955 Presence of coronary angioplasty implant and graft: Secondary | ICD-10-CM

## 2022-06-28 DIAGNOSIS — I252 Old myocardial infarction: Secondary | ICD-10-CM | POA: Diagnosis not present

## 2022-06-28 NOTE — Progress Notes (Signed)
Daily Session Note  Patient Details  Name: Todd MODI Sr. MRN: [id number] Date of Birth: [birth year] Referring Provider:   Flowsheet Row Cardiac Rehab from 05/17/2022 in [facility] Cardiac and Pulmonary Rehab  Referring Provider Lujean Amel MD       Encounter Date: 06/28/2022  Check In:  Session Check In - 06/28/22 0840       Check-In   Supervising physician immediately available to respond to emergencies See telemetry face sheet for immediately available ER MD    Location [facility]-Cardiac & Pulmonary Rehab    Staff Present Heath Lark, RN, BSN, CCRP;Jessica De [place]que, MA, RCEP, CCRP, Marylynn Pearson, MS, ASCM CEP, Exercise Physiologist;Joseph [place], [place]    Virtual Visit No    Medication changes reported     No    Fall or balance concerns reported    No    Warm-up and Cool-down Performed on first and last piece of equipment    Resistance Training Performed Yes    VAD Patient? No    PAD/SET Patient? No      Pain Assessment   Currently in Pain? No/denies               Exercise Prescription Changes - 06/28/22 0800       Home Exercise Plan   Plans to continue exercise at Home (comment)   walking, treadmill   Frequency Add 3 additional days to program exercise sessions.    Initial Home Exercises Provided 06/28/22             Social History   Tobacco Use  Smoking Status Former   Types: Cigarettes   Quit date: 1970   Years since quitting: 53.8   Passive exposure: Never  Smokeless Tobacco Never    Goals Met:  Independence with exercise equipment Exercise tolerated well No report of concerns or symptoms today  Goals Unmet:  Not Applicable  Comments: Pt able to follow exercise prescription today without complaint.  Will continue to monitor for progression.    Dr. Emily Filbert is Medical Director for [place].  Dr. Ottie Glazier is Medical Director for [facility] Pulmonary Rehabilitation.

## 2022-06-29 ENCOUNTER — Ambulatory Visit: Payer: Medicare Other

## 2022-07-03 ENCOUNTER — Ambulatory Visit: Payer: Medicare Other

## 2022-07-05 ENCOUNTER — Ambulatory Visit: Payer: Medicare Other

## 2022-07-05 ENCOUNTER — Encounter: Payer: Medicare Other | Admitting: *Deleted

## 2022-07-05 VITALS — Ht 68.25 in | Wt 172.6 lb

## 2022-07-05 DIAGNOSIS — I213 ST elevation (STEMI) myocardial infarction of unspecified site: Secondary | ICD-10-CM

## 2022-07-05 DIAGNOSIS — Z955 Presence of coronary angioplasty implant and graft: Secondary | ICD-10-CM

## 2022-07-05 DIAGNOSIS — I252 Old myocardial infarction: Secondary | ICD-10-CM | POA: Diagnosis not present

## 2022-07-05 NOTE — Patient Instructions (Signed)
Discharge Patient Instructions  Patient Details  Name: Todd Hansen. MRN: [id number] Date of Birth: [birth year] Referring Provider:  Tracie Harrier, MD   Number of Visits: 9  Reason for Discharge:  Early Exit:  Personal- per patient request  Smoking History:  Social History   Tobacco Use  Smoking Status Former   Types: Cigarettes   Quit date: 1970   Years since quitting: 53.8   Passive exposure: Never  Smokeless Tobacco Never    Diagnosis:  ST elevation myocardial infarction (STEMI), unspecified artery ([place])  Status post coronary artery stent placement  Initial Exercise Prescription:  Initial Exercise Prescription - 05/17/22 1100       Date of Initial Exercise RX and Referring Provider   Date 05/17/22    Referring Provider Lujean Amel MD      Treadmill   MPH 3    Grade 0.5    Minutes 15    METs 3.5      Recumbant Bike   Level 2    RPM 50    Watts 30    Minutes 15    METs 3      NuStep   Level 3    SPM 80    Minutes 15    METs 3      REL-XR   Level 1    Speed 50    Minutes 15    METs 3      Track   Laps 43    Minutes 15    METs 3.34      Prescription Details   Frequency (times per week) 3    Duration Progress to 30 minutes of continuous aerobic without signs/symptoms of physical distress      Intensity   THRR 40-80% of Max Heartrate 105-130    Ratings of Perceived Exertion 11-13    Perceived Dyspnea 0-4      Progression   Progression Continue to progress workloads to maintain intensity without signs/symptoms of physical distress.      Resistance Training   Training Prescription Yes    Weight 5 lb    Reps 10-15             Discharge Exercise Prescription (Final Exercise Prescription Changes):  Exercise Prescription Changes - 06/28/22 0800       Home Exercise Plan   Plans to continue exercise at Home (comment)   walking, treadmill   Frequency Add 3 additional days to program exercise sessions.    Initial Home  Exercises Provided 06/28/22             Functional Capacity:  6 Minute Walk     Row Name 05/17/22 1135 07/05/22 0809       6 Minute Walk   Phase Initial Discharge    Distance 1679 feet 1600 feet    Distance % Change -- -4.7 %    Distance Feet Change -- -79 ft    Walk Time 6 minutes 6 minutes    # of Rest Breaks 0 0    MPH 3.18 3.03    METS 3.28 3.17    RPE 9 11    Perceived Dyspnea  -- 0    VO2 Peak 11.46 11.1    Symptoms No No    Resting HR 80 bpm 68 bpm    Resting BP 124/58 112/68    Resting Oxygen Saturation  97 % --    Exercise Oxygen Saturation  during 6 min walk 94 % --  Max Ex. HR 100 bpm 97 bpm    Max Ex. BP 136/74 146/72    2 Minute Post BP 122/64 --             Quality of Life:  Quality of Life - 05/17/22 1145       Quality of Life   Select Quality of Life      Quality of Life Scores   Health/Function Pre 27.4 %    Socioeconomic Pre 28.5 %    Psych/Spiritual Pre 30 %    Family Pre 28.8 %    GLOBAL Pre 28.37 %               Nutrition & Weight - Outcomes:  Pre Biometrics - 05/17/22 1144       Pre Biometrics   Height 5' 8.25" (1.734 m)    Weight 171 lb 11.2 oz (77.9 kg)    Waist Circumference 35 inches    Hip Circumference 39.5 inches    Waist to Hip Ratio 0.89 %    BMI (Calculated) 25.9    Single Leg Stand 5.8 seconds             Post Biometrics - 07/05/22 [id number]        Post  Biometrics   Height 5' 8.25" (1.734 m)    Weight 172 lb 9.6 oz (78.3 kg)    BMI (Calculated) 26.04    Single Leg Stand 5.6 seconds             Nutrition:  Nutrition Therapy & Goals - 05/17/22 1145       Intervention Plan   Intervention Prescribe, educate and counsel regarding individualized specific dietary modifications aiming towards targeted core components such as weight, hypertension, lipid management, diabetes, heart failure and other comorbidities.    Expected Outcomes Short Term Goal: Understand basic principles of dietary  content, such as calories, fat, sodium, cholesterol and nutrients.;Short Term Goal: A plan has been developed with personal nutrition goals set during dietitian appointment.;Long Term Goal: Adherence to prescribed nutrition plan.              Goals reviewed with patient; copy given to patient.

## 2022-07-05 NOTE — Progress Notes (Signed)
Discharge Summary: Todd Hansen (DOB: 11/18/1945)  [name] graduated today from  rehab with 9 sessions completed.  Details of the patient's exercise prescription and what He needs to do in order to continue the prescription and progress were discussed with patient.  Patient was given a copy of prescription and goals.  Patient verbalized understanding.  [name] plans to continue to exercise by walking and treadmill.   [place] Name 05/17/22 1135 07/05/22 0809       6 Minute Walk   Phase Initial Discharge    Distance 1679 feet 1600 feet    Distance % Change -- -4.7 %    Distance Feet Change -- -79 ft    Walk Time 6 minutes 6 minutes    # of Rest Breaks 0 0    MPH 3.18 3.03    METS 3.28 3.17    RPE 9 11    Perceived Dyspnea  -- 0    VO2 Peak 11.46 11.1    Symptoms No No    Resting HR 80 bpm 68 bpm    Resting BP 124/58 112/68    Resting Oxygen Saturation  97 % --    Exercise Oxygen Saturation  during 6 min walk 94 % --    Max Ex. HR 100 bpm 97 bpm    Max Ex. BP 136/74 146/72    2 Minute Post BP 122/64 --

## 2022-07-05 NOTE — Progress Notes (Signed)
Daily Session Note  Patient Details  Name: Todd WEANT Sr. MRN: [id number] Date of Birth: 14-Dec-1945 Referring Provider:   Flowsheet Row Cardiac Rehab from 05/17/2022 in [facility] Cardiac and Pulmonary Rehab  Referring Provider Todd Amel MD       Encounter Date: 07/05/2022  Check In:  Session Check In - 07/05/22 0807       Check-In   Supervising physician immediately available to respond to emergencies See telemetry face sheet for immediately available ER MD    Location [facility]-Cardiac & Pulmonary Rehab    Staff Present Todd Keens, MS, ASCM CEP, Exercise Physiologist;Todd Rosebud Poles, RN, [place]    Virtual Visit No    Medication changes reported     No    Fall or balance concerns reported    No    Warm-up and Cool-down Performed on first and last piece of equipment    Resistance Training Performed Yes    VAD Patient? No    PAD/SET Patient? No      Pain Assessment   Currently in Pain? No/denies                Social History   Tobacco Use  Smoking Status Former   Types: Cigarettes   Quit date: 1970   Years since quitting: 53.8   Passive exposure: Never  Smokeless Tobacco Never    Goals Met:  Independence with exercise equipment Exercise tolerated well No report of concerns or symptoms today Strength training completed today  Goals Unmet:  Not Applicable  Comments:  Todd Hansen graduated today from  rehab with 36 sessions completed.  Details of the patient's exercise prescription and what He needs to do in order to continue the prescription and progress were discussed with patient.  Patient was given a copy of prescription and goals.  Patient verbalized understanding.  Darriel plans to continue to exercise by walking and treadmill.    Todd Hansen is Medical Director for [facility].  Todd Hansen is Medical Director for [facility] Pulmonary Rehabilitation.

## 2022-07-05 NOTE — Progress Notes (Signed)
Cardiac Individual Treatment Plan  Patient Details  Name: Todd SOOKDEO Sr. MRN: [id number] Date of Birth: Jul 11, 1946 Referring Provider:   Flowsheet Row Cardiac Rehab from 05/17/2022 in [facility] Cardiac and Pulmonary Rehab  Referring Provider Lujean Amel MD       Initial Encounter Date:  Flowsheet Row Cardiac Rehab from 05/17/2022 in [facility] Cardiac and Pulmonary Rehab  Date 05/17/22       Visit Diagnosis: ST elevation myocardial infarction (STEMI), unspecified artery [facility])  Status post coronary artery stent placement  Patient's Home Medications on Admission:  Current Outpatient Medications:    amLODipine (NORVASC) 5 MG tablet, Take 5 mg by mouth 2 (two) times daily. (Patient not taking: Reported on 05/09/2022), Disp: , Rfl:    aspirin 81 MG chewable tablet, Chew by mouth., Disp: , Rfl:    atorvastatin (LIPITOR) 80 MG tablet, Take 80 mg by mouth daily., Disp: , Rfl:    clopidogrel (PLAVIX) 75 MG tablet, Take by mouth., Disp: , Rfl:    cyanocobalamin 1000 MCG tablet, Take 1,000 mcg by mouth daily., Disp: , Rfl:    famotidine (PEPCID) 20 MG tablet, Take 1 tablet by mouth 2 (two) times daily., Disp: , Rfl:    isosorbide mononitrate (IMDUR) 60 MG 24 hr tablet, Take 60 mg by mouth daily. (Patient not taking: Reported on 05/09/2022), Disp: , Rfl:    losartan (COZAAR) 25 MG tablet, Take 25 mg by mouth daily., Disp: , Rfl:    metFORMIN (GLUCOPHAGE) 500 MG tablet, Take 500 mg by mouth daily., Disp: , Rfl:    metoprolol succinate (TOPROL-XL) 25 MG 24 hr tablet, Take 12.5 mg by mouth daily., Disp: , Rfl:    propylthiouracil (PTU) 50 MG tablet, Take 25 mg by mouth 2 (two) times daily. (Patient not taking: Reported on 05/09/2022), Disp: , Rfl: 0   Vitamin D, Ergocalciferol, (DRISDOL) 1.25 MG (50000 UNIT) CAPS capsule, Take 50,000 Units by mouth once a week. (Patient not taking: Reported on 05/09/2022), Disp: , Rfl:   Past Medical History: Past Medical History:  Diagnosis Date   Diverticulitis     Diverticulitis of large intestine with abscess without bleeding    Enlarged prostate    Gallstones    Hypertension    Hyperthyroidism     Tobacco Use: Social History   Tobacco Use  Smoking Status Former   Types: Cigarettes   Quit date: 1970   Years since quitting: 53.8   Passive exposure: Never  Smokeless Tobacco Never    Labs: Review Flowsheet       Latest Ref Rng & Units 12/23/2015 08/31/2021  Labs for ITP Cardiac and Pulmonary Rehab  Cholestrol 0 - 200 mg/dL 209  -  LDL (calc) 0 - 99 mg/dL 141  -  HDL-C >40 mg/dL 40  -  Trlycerides <150 mg/dL 142  -  Hemoglobin A1c 4.8 - 5.6 % - 8.5      Exercise Target Goals: Exercise Program Goal: Individual exercise prescription set using results from initial 6 min walk test and THRR while considering  patient's activity barriers and safety.   Exercise Prescription Goal: Initial exercise prescription builds to 30-45 minutes a day of aerobic activity, 2-3 days per week.  Home exercise guidelines will be given to patient during program as part of exercise prescription that the participant will acknowledge.   Education: Aerobic Exercise: - Group verbal and visual presentation on the components of exercise prescription. Introduces F.I.T.T principle from ACSM for exercise prescriptions.  Reviews F.I.T.T. principles of aerobic  exercise including progression. Written material given at graduation.   Education: Resistance Exercise: - Group verbal and visual presentation on the components of exercise prescription. Introduces F.I.T.T principle from ACSM for exercise prescriptions  Reviews F.I.T.T. principles of resistance exercise including progression. Written material given at graduation.    Education: Exercise & Equipment Safety: - Individual verbal instruction and demonstration of equipment use and safety with use of the equipment. Flowsheet Row Cardiac Rehab from 06/28/2022 in [facility] Cardiac and Pulmonary Rehab  Date 05/17/22   Educator [facility]  Instruction Review Code 1- Verbalizes Understanding       Education: Exercise Physiology & General Exercise Guidelines: - Group verbal and written instruction with models to review the exercise physiology of the cardiovascular system and associated critical values. Provides general exercise guidelines with specific guidelines to those with heart or lung disease.    Education: Flexibility, Balance, Mind/Body Relaxation: - Group verbal and visual presentation with interactive activity on the components of exercise prescription. Introduces F.I.T.T principle from ACSM for exercise prescriptions. Reviews F.I.T.T. principles of flexibility and balance exercise training including progression. Also discusses the mind body connection.  Reviews various relaxation techniques to help reduce and manage stress (i.e. Deep breathing, progressive muscle relaxation, and visualization). Balance handout provided to take home. Written material given at graduation.   Activity Barriers & Risk Stratification:  Activity Barriers & Cardiac Risk Stratification - 05/17/22 1136       Activity Barriers & Cardiac Risk Stratification   Activity Barriers Left Knee Replacement;Deconditioning;Balance Concerns;Joint Problems   partial knee, left shoulder surgery previously   Cardiac Risk Stratification High             6 Minute Walk:  6 Minute Walk     Row Name 05/17/22 1135         6 Minute Walk   Phase Initial     Distance 1679 feet     Walk Time 6 minutes     # of Rest Breaks 0     MPH 3.18     METS 3.28     RPE 9     VO2 Peak 11.46     Symptoms No     Resting HR 80 bpm     Resting BP 124/58     Resting Oxygen Saturation  97 %     Exercise Oxygen Saturation  during 6 min walk 94 %     Max Ex. HR 100 bpm     Max Ex. BP 136/74     2 Minute Post BP 122/64              Oxygen Initial Assessment:   Oxygen Re-Evaluation:   Oxygen Discharge (Final Oxygen  Re-Evaluation):   Initial Exercise Prescription:  Initial Exercise Prescription - 05/17/22 1100       Date of Initial Exercise RX and Referring Provider   Date 05/17/22    Referring Provider Lujean Amel MD      Treadmill   MPH 3    Grade 0.5    Minutes 15    METs 3.5      Recumbant Bike   Level 2    RPM 50    Watts 30    Minutes 15    METs 3      NuStep   Level 3    SPM 80    Minutes 15    METs 3      REL-XR   Level 1    Speed 50  Minutes 15    METs 3      Track   Laps 43    Minutes 15    METs 3.34      Prescription Details   Frequency (times per week) 3    Duration Progress to 30 minutes of continuous aerobic without signs/symptoms of physical distress      Intensity   THRR 40-80% of Max Heartrate 105-130    Ratings of Perceived Exertion 11-13    Perceived Dyspnea 0-4      Progression   Progression Continue to progress workloads to maintain intensity without signs/symptoms of physical distress.      Resistance Training   Training Prescription Yes    Weight 5 lb    Reps 10-15             Perform Capillary Blood Glucose checks as needed.  Exercise Prescription Changes:   Exercise Prescription Changes     Row Name 05/17/22 1100 06/12/22 1400 06/26/22 1300 06/28/22 0800       Response to Exercise   Blood Pressure (Admit) 124/58 110/64 130/78 --    Blood Pressure (Exercise) 136/74 144/72 136/78 --    Blood Pressure (Exit) 122/64 124/68 124/72 --    Heart Rate (Admit) 80 bpm 80 bpm 83 bpm --    Heart Rate (Exercise) 100 bpm 123 bpm 120 bpm --    Heart Rate (Exit) 83 bpm 103 bpm 103 bpm --    Oxygen Saturation (Admit) 97 % -- -- --    Oxygen Saturation (Exercise) 94 % -- -- --    Rating of Perceived Exertion (Exercise) _0 --    Symptoms none none none --    Comments walk test results First two days of exercise -- --    Duration -- Continue with 30 min of aerobic exercise without signs/symptoms of physical distress. Continue  with 30 min of aerobic exercise without signs/symptoms of physical distress. --    Intensity -- THRR unchanged THRR unchanged --      Progression   Progression -- Continue to progress workloads to maintain intensity without signs/symptoms of physical distress. Continue to progress workloads to maintain intensity without signs/symptoms of physical distress. --    Average METs -- 3.19 3.17 --      Resistance Training   Training Prescription -- Yes Yes --    Weight -- 5 lb 5 lb --    Reps -- 10-15 10-15 --      Interval Training   Interval Training -- No No --      Treadmill   MPH -- 2.6 2.5 --    Grade -- 0.5 1.5 --    Minutes -- 15 15 --    METs -- 3.17 3.43 --      Recumbant Bike   Level -- 2 5 --    Watts -- 22 30 --    Minutes -- 15 15 --    METs -- 3.17 2.9 --      Home Exercise Plan   Plans to continue exercise at -- -- -- Home (comment)  walking, treadmill    Frequency -- -- -- Add 3 additional days to program exercise sessions.    Initial Home Exercises Provided -- -- -- 06/28/22      Oxygen   Maintain Oxygen Saturation -- 88% or higher 88% or higher --             Exercise Comments:   Exercise Comments  [place] Name 05/29/22 0805           Exercise Comments First full day of exercise!  Patient was oriented to gym and equipment including functions, settings, policies, and procedures.  Patient's individual exercise prescription and treatment plan were reviewed.  All starting workloads were established based on the results of the 6 minute walk test done at initial orientation visit.  The plan for exercise progression was also introduced and progression will be customized based on patient's performance and goals.                Exercise Goals and Review:   Exercise Goals     Row Name 05/17/22 1143             Exercise Goals   Increase Physical Activity Yes       Intervention Provide advice, education, support and counseling about physical  activity/exercise needs.;Develop an individualized exercise prescription for aerobic and resistive training based on initial evaluation findings, risk stratification, comorbidities and participant's personal goals.       Expected Outcomes Short Term: Attend rehab on a regular basis to increase amount of physical activity.;Long Term: Add in home exercise to make exercise part of routine and to increase amount of physical activity.;Long Term: Exercising regularly at least 3-5 days a week.       Increase Strength and Stamina Yes       Intervention Provide advice, education, support and counseling about physical activity/exercise needs.;Develop an individualized exercise prescription for aerobic and resistive training based on initial evaluation findings, risk stratification, comorbidities and participant's personal goals.       Expected Outcomes Short Term: Increase workloads from initial exercise prescription for resistance, speed, and METs.;Short Term: Perform resistance training exercises routinely during rehab and add in resistance training at home;Long Term: Improve cardiorespiratory fitness, muscular endurance and strength as measured by increased METs and functional capacity (6MWT)       Able to understand and use rate of perceived exertion (RPE) scale Yes       Intervention Provide education and explanation on how to use RPE scale       Expected Outcomes Long Term:  Able to use RPE to guide intensity level when exercising independently;Short Term: Able to use RPE daily in rehab to express subjective intensity level       Able to understand and use Dyspnea scale Yes       Intervention Provide education and explanation on how to use Dyspnea scale       Expected Outcomes Short Term: Able to use Dyspnea scale daily in rehab to express subjective sense of shortness of breath during exertion;Long Term: Able to use Dyspnea scale to guide intensity level when exercising independently       Knowledge and  understanding of Target Heart Rate Range (THRR) Yes       Intervention Provide education and explanation of THRR including how the numbers were predicted and where they are located for reference       Expected Outcomes Short Term: Able to state/look up THRR;Short Term: Able to use daily as guideline for intensity in rehab;Long Term: Able to use THRR to govern intensity when exercising independently       Able to check pulse independently Yes       Intervention Provide education and demonstration on how to check pulse in carotid and radial arteries.;Review the importance of being able to check your own pulse for safety during independent exercise  Expected Outcomes Short Term: Able to explain why pulse checking is important during independent exercise;Long Term: Able to check pulse independently and accurately       Understanding of Exercise Prescription Yes       Intervention Provide education, explanation, and written materials on patient's individual exercise prescription       Expected Outcomes Short Term: Able to explain program exercise prescription;Long Term: Able to explain home exercise prescription to exercise independently                Exercise Goals Re-Evaluation :  Exercise Goals Re-Evaluation     Row Name 05/29/22 0805 06/12/22 1431 06/26/22 1356 06/28/22 0829       Exercise Goal Re-Evaluation   Exercise Goals Review Understanding of Exercise Prescription;Knowledge and understanding of Target Heart Rate Range (THRR);Able to understand and use rate of perceived exertion (RPE) scale;Able to understand and use Dyspnea scale Understanding of Exercise Prescription;Increase Physical Activity;Increase Strength and Stamina Understanding of Exercise Prescription;Increase Physical Activity;Increase Strength and Stamina Understanding of Exercise Prescription;Increase Physical Activity;Able to understand and use rate of perceived exertion (RPE) scale;Knowledge and understanding of  Target Heart Rate Range (THRR);Able to understand and use Dyspnea scale;Increase Strength and Stamina;Able to check pulse independently    Comments Reviewed RPE and dyspnea scales, THR and program prescription with pt today.  Pt voiced understanding and was given a copy of goals to take home. Todd Hansen is off to a good start in rehab. He had an overall average MET level of 3.19 METs. He also did well on the treadmill at a speed of 2.6 mph and an incline of 0.5%. He tolerated using 5 lb hand weights for resistance training as well. We will continue to monitor his progress in the program. Todd Hansen continues to do well in rehab. He increased his workload on the treadmill by increasing his incline to 1.5% while maintaining a speed of 2.5 mph. He also improved to level 5 on the recumbent bike. He has tolerated 5 lb weights for resistance training as well. We will continue to monitor his progress. Reviewed home exercise with pt today.  Pt plans to walk and use treadmill at home for exercise.  Reviewed THR, pulse, RPE, sign and symptoms, pulse oximetery and when to call 911 or MD.  Also discussed weather considerations and indoor options.  Pt voiced understanding.  We also talked about improved attendance for consistency, but he said that he is using his treadmill at home on days he does not attend.    Expected Outcomes Short: Use RPE daily to regulate intensity. Long: Follow program prescription in THR. Short: Continue to follow exercise prescription and increase workloads. Long: Continue to increase strength and stamina. Short: Continue to follow exercise prescription and increase workloads. Long: Continue to increase strength and stamina. Short; Consistient attendance Long: Conitnue to exercise independently             Discharge Exercise Prescription (Final Exercise Prescription Changes):  Exercise Prescription Changes - 06/28/22 0800       Home Exercise Plan   Plans to continue exercise at Home (comment)    walking, treadmill   Frequency Add 3 additional days to program exercise sessions.    Initial Home Exercises Provided 06/28/22             Nutrition:  Target Goals: Understanding of nutrition guidelines, daily intake of sodium <159m, cholesterol <2073m calories 30% from fat and 7% or less from saturated fats, daily to have 5 or more  servings of fruits and vegetables.  Education: All About Nutrition: -Group instruction provided by verbal, written material, interactive activities, discussions, models, and posters to present general guidelines for heart healthy nutrition including fat, fiber, MyPlate, the role of sodium in heart healthy nutrition, utilization of the nutrition label, and utilization of this knowledge for meal planning. Follow up email sent as well. Written material given at graduation. Flowsheet Row Cardiac Rehab from 06/28/2022 in [facility] Cardiac and Pulmonary Rehab  Education need identified 05/17/22  Date 06/07/22  Educator [place]  Instruction Review Code 1- Verbalizes Understanding       Biometrics:  Pre Biometrics - 05/17/22 1144       Pre Biometrics   Height 5' 8.25" (1.734 m)    Weight 171 lb 11.2 oz (77.9 kg)    Waist Circumference 35 inches    Hip Circumference 39.5 inches    Waist to Hip Ratio 0.89 %    BMI (Calculated) 25.9    Single Leg Stand 5.8 seconds             Post Biometrics - 07/05/22 9024        Post  Biometrics   Height 5' 8.25" (1.734 m)    Weight 172 lb 9.6 oz (78.3 kg)    BMI (Calculated) 26.04    Single Leg Stand 5.6 seconds             Nutrition Therapy Plan and Nutrition Goals:  Nutrition Therapy & Goals - 05/17/22 1145       Intervention Plan   Intervention Prescribe, educate and counsel regarding individualized specific dietary modifications aiming towards targeted core components such as weight, hypertension, lipid management, diabetes, heart failure and other comorbidities.    Expected Outcomes Short Term Goal:  Understand basic principles of dietary content, such as calories, fat, sodium, cholesterol and nutrients.;Short Term Goal: A plan has been developed with personal nutrition goals set during dietitian appointment.;Long Term Goal: Adherence to prescribed nutrition plan.             Nutrition Assessments:  MEDIFICTS Score Key: ?70 Need to make dietary changes  40-70 Heart Healthy Diet ? 40 Therapeutic Level Cholesterol Diet  Flowsheet Row Cardiac Rehab from 05/17/2022 in [facility] Cardiac and Pulmonary Rehab  Picture Your Plate Total Score on Admission 53      Picture Your Plate Scores: <09 Unhealthy dietary pattern with much room for improvement. 41-50 Dietary pattern unlikely to meet recommendations for good health and room for improvement. 51-60 More healthful dietary pattern, with some room for improvement.  >60 Healthy dietary pattern, although there may be some specific behaviors that could be improved.    Nutrition Goals Re-Evaluation:  Nutrition Goals Re-Evaluation     [place] Name 06/07/22 0821 06/28/22 0825           Goals   Current Weight 169 lb (76.7 kg) --      Nutrition Goal Eat smaller portions. Short: continue to eat smaller portions. Long: maintain a diet that adheres to him.      Comment Todd Hansen has made good changes in his diet which consist of eating smaller portions more frequently. He is watching his blood sugars at home. He is not eating any fried foods and has switch to grilled chicken. Todd Hansen is doing well with his diet.  He has cut out fried food.  He is also working on portion control and doing well.  He is also getting lots of fruits and vegetables.  Expected Outcome Short: continue to eat smaller portions. Long: maintain a diet that adheres to him. Short: Continue to work on variety Long; Continue to work on portion control               Nutrition Goals Discharge (Final Nutrition Goals Re-Evaluation):  Nutrition Goals Re-Evaluation - 06/28/22 0825        Goals   Nutrition Goal Short: continue to eat smaller portions. Long: maintain a diet that adheres to him.    Comment Todd Hansen is doing well with his diet.  He has cut out fried food.  He is also working on portion control and doing well.  He is also getting lots of fruits and vegetables.    Expected Outcome Short: Continue to work on variety Long; Continue to work on portion control             Psychosocial: Target Goals: Acknowledge presence or absence of significant depression and/or stress, maximize coping skills, provide positive support system. Participant is able to verbalize types and ability to use techniques and skills needed for reducing stress and depression.   Education: Stress, Anxiety, and Depression - Group verbal and visual presentation to define topics covered.  Reviews how body is impacted by stress, anxiety, and depression.  Also discusses healthy ways to reduce stress and to treat/manage anxiety and depression.  Written material given at graduation.   Education: Sleep Hygiene -Provides group verbal and written instruction about how sleep can affect your health.  Define sleep hygiene, discuss sleep cycles and impact of sleep habits. Review good sleep hygiene tips.    Initial Review & Psychosocial Screening:  Initial Psych Review & Screening - 05/09/22 1317       Initial Review   Current issues with None Identified      Family Dynamics   Good Support System? Yes   wife, daughter, son  and dog     Barriers   Psychosocial barriers to participate in program There are no identifiable barriers or psychosocial needs.      Screening Interventions   Interventions Encouraged to exercise    Expected Outcomes Short Term goal: Utilizing psychosocial counselor, staff and physician to assist with identification of specific Stressors or current issues interfering with healing process. Setting desired goal for each stressor or current issue identified.;Long Term Goal:  Stressors or current issues are controlled or eliminated.;Short Term goal: Identification and review with participant of any Quality of Life or Depression concerns found by scoring the questionnaire.;Long Term goal: The participant improves quality of Life and PHQ9 Scores as seen by post scores and/or verbalization of changes             Quality of Life Scores:   Quality of Life - 05/17/22 1145       Quality of Life   Select Quality of Life      Quality of Life Scores   Health/Function Pre 27.4 %    Socioeconomic Pre 28.5 %    Psych/Spiritual Pre 30 %    Family Pre 28.8 %    GLOBAL Pre 28.37 %            Scores of 19 and below usually indicate a poorer quality of life in these areas.  A difference of  2-3 points is a clinically meaningful difference.  A difference of 2-3 points in the total score of the Quality of Life Index has been associated with significant improvement in overall quality of life, self-image, physical symptoms, and  general health in studies assessing change in quality of life.  PHQ-9: Review Flowsheet       05/17/2022  Depression screen PHQ 2/9  Decreased Interest 0  Down, Depressed, Hopeless 0  PHQ - 2 Score 0  Altered sleeping 0  Tired, decreased energy 0  Change in appetite 0  Feeling bad or failure about yourself  0  Trouble concentrating 0  Moving slowly or fidgety/restless 0  Suicidal thoughts 0  PHQ-9 Score 0  Difficult doing work/chores Not difficult at all   Interpretation of Total Score  Total Score Depression Severity:  1-4 = Minimal depression, 5-9 = Mild depression, 10-14 = Moderate depression, 15-19 = Moderately severe depression, 20-27 = Severe depression   Psychosocial Evaluation and Intervention:  Psychosocial Evaluation - 05/09/22 1330       Psychosocial Evaluation & Interventions   Interventions Encouraged to exercise with the program and follow exercise prescription    Comments Todd Hansen has no barriers to attending the  program.  He is ready to attend.Marland Kitchen He lives with his wife and he has a dog at home. His support is his wife and children and his "Lilia Pro" (dog). He has been active up to hisds heart attack and plans to return to his golf and bowling.   He is hard of hearing and the hearing aids he received from the [place] don't work well.  He plans on getting them checked at the [place].    Expected Outcomes STG Todd Hansen attends all scheduled sessions, he is able to return to his daily activities. LTG [place] continues to progress with his exercise to enjoy his activities    Continue Psychosocial Services  Follow up required by staff             Psychosocial Re-Evaluation:  Psychosocial Re-Evaluation     [place] Name 06/07/22 [phone]             Psychosocial Re-Evaluation   Current issues with None Identified       Comments Patient reports no issues with their current mental states, sleep, stress, depression or anxiety. Will follow up with patient in a few weeks for any changes.       Expected Outcomes Short: Continue to exercise regularly to support mental health and notify staff of any changes. Long: maintain mental health and well being through teaching of rehab or prescribed medications independently.       Interventions Encouraged to attend Cardiac Rehabilitation for the exercise       Continue Psychosocial Services  Follow up required by staff                Psychosocial Discharge (Final Psychosocial Re-Evaluation):  Psychosocial Re-Evaluation - 06/07/22 0824       Psychosocial Re-Evaluation   Current issues with None Identified    Comments Patient reports no issues with their current mental states, sleep, stress, depression or anxiety. Will follow up with patient in a few weeks for any changes.    Expected Outcomes Short: Continue to exercise regularly to support mental health and notify staff of any changes. Long: maintain mental health and well being through teaching of rehab or prescribed medications  independently.    Interventions Encouraged to attend Cardiac Rehabilitation for the exercise    Continue Psychosocial Services  Follow up required by staff             Vocational Rehabilitation: Provide vocational rehab assistance to qualifying candidates.   Vocational Rehab Evaluation & Intervention:   Education:  Education Goals: Education classes will be provided on a variety of topics geared toward better understanding of heart health and risk factor modification. Participant will state understanding/return demonstration of topics presented as noted by education test scores.  Learning Barriers/Preferences:   General Cardiac Education Topics:  AED/CPR: - Group verbal and written instruction with the use of models to demonstrate the basic use of the AED with the basic ABC's of resuscitation.   Anatomy and Cardiac Procedures: - Group verbal and visual presentation and models provide information about basic cardiac anatomy and function. Reviews the testing methods done to diagnose heart disease and the outcomes of the test results. Describes the treatment choices: Medical Management, Angioplasty, or Coronary Bypass Surgery for treating various heart conditions including Myocardial Infarction, Angina, Valve Disease, and Cardiac Arrhythmias.  Written material given at graduation.   Medication Safety: - Group verbal and visual instruction to review commonly prescribed medications for heart and lung disease. Reviews the medication, class of the drug, and side effects. Includes the steps to properly store meds and maintain the prescription regimen.  Written material given at graduation.   Intimacy: - Group verbal instruction through game format to discuss how heart and lung disease can affect sexual intimacy. Written material given at graduation..   Know Your Numbers and Heart Failure: - Group verbal and visual instruction to discuss disease risk factors for cardiac and pulmonary  disease and treatment options.  Reviews associated critical values for Overweight/Obesity, Hypertension, Cholesterol, and Diabetes.  Discusses basics of heart failure: signs/symptoms and treatments.  Introduces Heart Failure Zone chart for action plan for heart failure.  Written material given at graduation. Flowsheet Row Cardiac Rehab from 06/28/2022 in [facility] Cardiac and Pulmonary Rehab  Education need identified 05/17/22  Date 06/21/22  Educator SB  Instruction Review Code 1- Verbalizes Understanding       Infection Prevention: - Provides verbal and written material to individual with discussion of infection control including proper hand washing and proper equipment cleaning during exercise session. Flowsheet Row Cardiac Rehab from 06/28/2022 in [facility] Cardiac and Pulmonary Rehab  Date 05/17/22  Educator [facility]  Instruction Review Code 1- Verbalizes Understanding       Falls Prevention: - Provides verbal and written material to individual with discussion of falls prevention and safety. Flowsheet Row Cardiac Rehab from 06/28/2022 in [facility] Cardiac and Pulmonary Rehab  Date 05/09/22  Educator SB  Instruction Review Code 1- Verbalizes Understanding       Other: -Provides group and verbal instruction on various topics (see comments)   Knowledge Questionnaire Score:  Knowledge Questionnaire Score - 05/17/22 1145       Knowledge Questionnaire Score   Pre Score 24/26             Core Components/Risk Factors/Patient Goals at Admission:  Personal Goals and Risk Factors at Admission - 05/17/22 1145       Core Components/Risk Factors/Patient Goals on Admission    Weight Management Yes;Weight Loss    Intervention Weight Management: Provide education and appropriate resources to help participant work on and attain dietary goals.;Weight Management: Develop a combined nutrition and exercise program designed to reach desired caloric intake, while maintaining appropriate intake of  nutrient and fiber, sodium and fats, and appropriate energy expenditure required for the weight goal.;Weight Management/Obesity: Establish reasonable short term and long term weight goals.    Admit Weight 171 lb 11.2 oz (77.9 kg)    Goal Weight: Short Term 165 lb (74.8 kg)    Goal Weight:  Long Term 165 lb (74.8 kg)    Expected Outcomes Short Term: Continue to assess and modify interventions until short term weight is achieved;Long Term: Adherence to nutrition and physical activity/exercise program aimed toward attainment of established weight goal;Weight Maintenance: Understanding of the daily nutrition guidelines, which includes 25-35% calories from fat, 7% or less cal from saturated fats, less than $RemoveB'200mg'RdnVZGIq$  cholesterol, less than 1.5gm of sodium, & 5 or more servings of fruits and vegetables daily;Weight Loss: Understanding of general recommendations for a balanced deficit meal plan, which promotes 1-2 lb weight loss per week and includes a negative energy balance of [phone] kcal/d;Understanding recommendations for meals to include 15-35% energy as protein, 25-35% energy from fat, 35-60% energy from carbohydrates, less than $RemoveB'200mg'AusAyFYU$  of dietary cholesterol, 20-35 gm of total fiber daily;Understanding of distribution of calorie intake throughout the day with the consumption of 4-5 meals/snacks    Diabetes Yes    Intervention Provide education about signs/symptoms and action to take for hypo/hyperglycemia.;Provide education about proper nutrition, including hydration, and aerobic/resistive exercise prescription along with prescribed medications to achieve blood glucose in normal ranges: Fasting glucose 65-99 mg/dL    Expected Outcomes Short Term: Participant verbalizes understanding of the signs/symptoms and immediate care of hyper/hypoglycemia, proper foot care and importance of medication, aerobic/resistive exercise and nutrition plan for blood glucose control.;Long Term: Attainment of HbA1C < 7%.     Hypertension Yes    Intervention Provide education on lifestyle modifcations including regular physical activity/exercise, weight management, moderate sodium restriction and increased consumption of fresh fruit, vegetables, and low fat dairy, alcohol moderation, and smoking cessation.;Monitor prescription use compliance.    Expected Outcomes Short Term: Continued assessment and intervention until BP is < 140/71mm HG in hypertensive participants. < 130/4mm HG in hypertensive participants with diabetes, heart failure or chronic kidney disease.;Long Term: Maintenance of blood pressure at goal levels.    Lipids Yes    Intervention Provide education and support for participant on nutrition & aerobic/resistive exercise along with prescribed medications to achieve LDL '70mg'$ , HDL >$Remo'40mg'oAaEN$ .    Expected Outcomes Short Term: Participant states understanding of desired cholesterol values and is compliant with medications prescribed. Participant is following exercise prescription and nutrition guidelines.;Long Term: Cholesterol controlled with medications as prescribed, with individualized exercise RX and with personalized nutrition plan. Value goals: LDL < $Rem'70mg'ILnM$ , HDL > 40 mg.             Education:Diabetes - Individual verbal and written instruction to review signs/symptoms of diabetes, desired ranges of glucose level fasting, after meals and with exercise. Acknowledge that pre and post exercise glucose checks will be done for 3 sessions at entry of program. [place] from 06/28/2022 in [facility] Cardiac and Pulmonary Rehab  Date 05/17/22  Educator [facility]  Instruction Review Code 1- Verbalizes Understanding       Core Components/Risk Factors/Patient Goals Review:   Goals and Risk Factor Review     Row Name 06/07/22 0824 06/28/22 0826           Core Components/Risk Factors/Patient Goals Review   Personal Goals Review Diabetes Diabetes;Weight Management/Obesity;Hypertension;Lipids       Review Todd Hansen has been doing well with checking his blood sugar at home. He states that his sugar runs anywhere from 100 to 120 at home. Informed him that those are good numbers. He was concerned with his sugar being high this morning before exercise. Explained to patient that he is going to be a little higher after he eats something and is  good to have food brefore he exercises to keep his blood sugar up. Patient Verbalizes understanding. Todd Hansen is doing to 172 lb, which is down 15 lb for him overall.  His sugars are doing well and he continues to check them at home.  His pressures are doing well at home and in class.  He has not had any problems with is medications.      Expected Outcomes Short: continue to check blood sugar at home, Long: maintain blood sugars levels independently. Short: Conitnue to work on weight loss Long: Continue to monitor risk factors               Core Components/Risk Factors/Patient Goals at Discharge (Final Review):   Goals and Risk Factor Review - 06/28/22 0826       Core Components/Risk Factors/Patient Goals Review   Personal Goals Review Diabetes;Weight Management/Obesity;Hypertension;Lipids    Review Todd Hansen is doing to 172 lb, which is down 15 lb for him overall.  His sugars are doing well and he continues to check them at home.  His pressures are doing well at home and in class.  He has not had any problems with is medications.    Expected Outcomes Short: Conitnue to work on weight loss Long: Continue to monitor risk factors             ITP Comments:  ITP Comments     Row Name 05/09/22 1329 05/17/22 1135 05/29/22 0805 06/13/22 0654 07/05/22 0809   ITP Comments Virtual orientation call completed today. he has an appointment on Date: 05/17/2022  for EP eval and gym Orientation.  Documentation of diagnosis [facility] in [facility]  Date: 04/21/2022 . Completed 6MWT and gym orientation. Initial ITP created and sent for review to Dr. Emily Filbert, Medical Director.  First full day of exercise!  Patient was oriented to gym and equipment including functions, settings, policies, and procedures.  Patient's individual exercise prescription and treatment plan were reviewed.  All starting workloads were established based on the results of the 6 minute walk test done at initial orientation visit.  The plan for exercise progression was also introduced and progression will be customized based on patient's performance and goals. 30 Day review completed. Medical Director ITP review done, changes made as directed, and signed approval by Medical Director.   New to program Todd Hansen graduated today from  rehab with 36 sessions completed.  Details of the patient's exercise prescription and what He needs to do in order to continue the prescription and progress were discussed with patient.  Patient was given a copy of prescription and goals.  Patient verbalized understanding.  Todd Hansen plans to continue to exercise by walking and treadmill.            Comments: Discharge ITP

## 2022-07-06 ENCOUNTER — Ambulatory Visit: Payer: Medicare Other

## 2022-07-10 ENCOUNTER — Ambulatory Visit: Payer: Medicare Other

## 2022-07-12 ENCOUNTER — Ambulatory Visit: Payer: Medicare Other

## 2022-07-13 ENCOUNTER — Ambulatory Visit: Payer: Medicare Other

## 2022-07-17 ENCOUNTER — Ambulatory Visit: Payer: Medicare Other

## 2022-07-19 ENCOUNTER — Ambulatory Visit: Payer: Medicare Other

## 2022-07-20 ENCOUNTER — Ambulatory Visit: Payer: Medicare Other

## 2022-07-24 ENCOUNTER — Ambulatory Visit: Payer: Medicare Other

## 2022-07-26 ENCOUNTER — Ambulatory Visit: Payer: Medicare Other

## 2022-07-27 ENCOUNTER — Ambulatory Visit: Payer: Medicare Other

## 2022-07-31 ENCOUNTER — Ambulatory Visit: Payer: Medicare Other

## 2022-08-03 ENCOUNTER — Ambulatory Visit: Payer: Medicare Other

## 2022-08-07 ENCOUNTER — Ambulatory Visit: Payer: Medicare Other

## 2022-08-09 ENCOUNTER — Ambulatory Visit: Payer: Medicare Other

## 2022-08-10 ENCOUNTER — Ambulatory Visit: Payer: Medicare Other

## 2022-08-14 ENCOUNTER — Ambulatory Visit: Payer: Medicare Other

## 2022-08-16 ENCOUNTER — Ambulatory Visit: Payer: Medicare Other
